# Patient Record
Sex: Male | Born: 1960 | Race: White | Hispanic: No | Smoking: Never smoker
Health system: Southern US, Community
[De-identification: ages and names within clinical notes are randomized; demographics above are authoritative.]

## PROBLEM LIST (undated history)

## (undated) ENCOUNTER — Emergency Department (HOSPITAL_COMMUNITY): Admission: EM | Payer: PPO | Source: Home / Self Care

## (undated) ENCOUNTER — Ambulatory Visit (HOSPITAL_COMMUNITY): Admission: EM

## (undated) DIAGNOSIS — R519 Headache, unspecified: Secondary | ICD-10-CM

## (undated) DIAGNOSIS — E7800 Pure hypercholesterolemia, unspecified: Secondary | ICD-10-CM

## (undated) DIAGNOSIS — I8290 Acute embolism and thrombosis of unspecified vein: Secondary | ICD-10-CM

## (undated) DIAGNOSIS — I69320 Aphasia following cerebral infarction: Secondary | ICD-10-CM

## (undated) DIAGNOSIS — T7840XA Allergy, unspecified, initial encounter: Secondary | ICD-10-CM

## (undated) DIAGNOSIS — R51 Headache: Secondary | ICD-10-CM

## (undated) DIAGNOSIS — I639 Cerebral infarction, unspecified: Secondary | ICD-10-CM

## (undated) HISTORY — PX: HERNIA REPAIR: SHX51

## (undated) HISTORY — DX: Acute embolism and thrombosis of unspecified vein: I82.90

## (undated) HISTORY — DX: Pure hypercholesterolemia, unspecified: E78.00

## (undated) HISTORY — DX: Cerebral infarction, unspecified: I63.9

## (undated) HISTORY — DX: Allergy, unspecified, initial encounter: T78.40XA

## (undated) HISTORY — DX: Aphasia following cerebral infarction: I69.320

## (undated) HISTORY — DX: Headache: R51

## (undated) HISTORY — DX: Headache, unspecified: R51.9

---

## 2015-01-16 LAB — CBC AND DIFFERENTIAL
HEMATOCRIT: 47 % (ref 41–53)
Hemoglobin: 16 g/dL (ref 13.5–17.5)
Platelets: 216 10*3/uL (ref 150–399)
WBC: 5.6 10^3/mL

## 2015-01-16 LAB — HEPATIC FUNCTION PANEL
ALT: 35 U/L (ref 10–40)
AST: 21 U/L (ref 14–40)

## 2015-01-16 LAB — BASIC METABOLIC PANEL
BUN: 13 mg/dL (ref 4–21)
CREATININE: 1 mg/dL (ref <=1.3)
GLUCOSE: 94 mg/dL
POTASSIUM: 5.2 mmol/L (ref 3.4–5.3)
Sodium: 143 mmol/L (ref 137–147)

## 2015-01-16 LAB — PSA: PSA: 1.2

## 2015-01-16 LAB — LIPID PANEL
Cholesterol: 146 mg/dL (ref 0–200)
HDL: 53 mg/dL (ref 35–70)
LDL CALC: 79 mg/dL
Triglycerides: 70 mg/dL (ref 40–160)

## 2015-01-16 LAB — TSH: TSH: 0.77 u[IU]/mL (ref <=5.90)

## 2015-03-28 ENCOUNTER — Ambulatory Visit: Payer: Self-pay | Admitting: Speech Pathology

## 2015-04-03 ENCOUNTER — Ambulatory Visit: Payer: Self-pay | Admitting: Speech Pathology

## 2015-04-05 ENCOUNTER — Ambulatory Visit: Payer: Self-pay | Admitting: Speech Pathology

## 2015-04-09 ENCOUNTER — Ambulatory Visit: Payer: Self-pay | Admitting: Speech Pathology

## 2015-04-12 ENCOUNTER — Ambulatory Visit: Payer: Self-pay | Admitting: Speech Pathology

## 2015-04-16 ENCOUNTER — Ambulatory Visit: Payer: Self-pay | Admitting: Speech Pathology

## 2015-04-19 ENCOUNTER — Ambulatory Visit: Payer: Self-pay | Admitting: Speech Pathology

## 2015-04-23 ENCOUNTER — Ambulatory Visit: Payer: Self-pay | Admitting: Speech Pathology

## 2015-04-26 ENCOUNTER — Ambulatory Visit: Payer: Self-pay | Admitting: Speech Pathology

## 2015-06-03 ENCOUNTER — Encounter: Payer: Self-pay | Admitting: Physician Assistant

## 2015-06-03 ENCOUNTER — Ambulatory Visit (INDEPENDENT_AMBULATORY_CARE_PROVIDER_SITE_OTHER): Payer: Managed Care, Other (non HMO) | Admitting: Physician Assistant

## 2015-06-03 VITALS — BP 120/78 | HR 73 | Temp 98.2°F | Resp 16 | Wt 182.2 lb

## 2015-06-03 DIAGNOSIS — R51 Headache: Secondary | ICD-10-CM

## 2015-06-03 DIAGNOSIS — I999 Unspecified disorder of circulatory system: Secondary | ICD-10-CM | POA: Insufficient documentation

## 2015-06-03 DIAGNOSIS — Z789 Other specified health status: Secondary | ICD-10-CM | POA: Insufficient documentation

## 2015-06-03 DIAGNOSIS — R42 Dizziness and giddiness: Secondary | ICD-10-CM | POA: Diagnosis not present

## 2015-06-03 DIAGNOSIS — R519 Headache, unspecified: Secondary | ICD-10-CM | POA: Insufficient documentation

## 2015-06-03 DIAGNOSIS — I639 Cerebral infarction, unspecified: Secondary | ICD-10-CM | POA: Insufficient documentation

## 2015-06-03 DIAGNOSIS — E7800 Pure hypercholesterolemia, unspecified: Secondary | ICD-10-CM | POA: Insufficient documentation

## 2015-06-03 DIAGNOSIS — I69920 Aphasia following unspecified cerebrovascular disease: Secondary | ICD-10-CM | POA: Insufficient documentation

## 2015-06-03 NOTE — Patient Instructions (Signed)

## 2015-06-03 NOTE — Progress Notes (Signed)
Patient ID: Johnathan Campbell, male   DOB: 10-Jan-1961, 54 y.o.   MRN: [id number]       Patient: Johnathan Campbell Male    DOB: 20-Mar-1961   54 y.o.   MRN: [id number] Visit Date: 06/03/2015  Today's Provider: Mar Daring, PA-C   Chief Complaint  Patient presents with  . Headache    for about three weeks,   . Dizziness    mornings,  for about around 2 moths   Subjective:    Headache  This is a recurrent problem. The current episode started more than 1 month ago. The problem occurs intermittently. The problem has been unchanged. The pain is located in the left unilateral region. The pain does not radiate. The pain quality is similar to prior headaches. The quality of the pain is described as dull and throbbing. The pain is at a severity of 3/10. The pain is mild. Associated symptoms include dizziness, photophobia and sinus pressure. Pertinent negatives include no abdominal pain, abnormal behavior, anorexia, back pain, blurred vision, coughing, drainage, ear pain, eye pain, eye redness, eye watering, facial sweating, fever, hearing loss, insomnia, loss of balance, muscle aches, nausea, neck pain, numbness, phonophobia, rhinorrhea, scalp tenderness, seizures, sore throat, swollen glands, tingling, tinnitus, visual change, vomiting, weakness or weight loss. The symptoms are aggravated by bright light (when he gets frustrated trying to speak). He has tried acetaminophen for the symptoms. The treatment provided significant relief. His past medical history is significant for migraines in the family. There is no history of cancer, cluster headaches, hypertension, immunosuppression, migraine headaches, obesity, pseudotumor cerebri, recent head traumas, sinus disease or TMJ. (Personal h/o embolic CVA 2 years ago)  Dizziness This is a recurrent problem. The current episode started more than 1 month ago (2 months ago). The problem occurs intermittently. The problem has been unchanged. Associated symptoms  include vertigo. Pertinent negatives include no abdominal pain, anorexia, arthralgias, change in bowel habit, chest pain, chills, congestion, coughing, diaphoresis, fatigue, fever, headaches, joint swelling, myalgias, nausea, neck pain, numbness, rash, sore throat, swollen glands, urinary symptoms, visual change, vomiting or weakness. Associated symptoms comments: Occurs in the mornings when he first wakes up. Nothing aggravates the symptoms. He has tried nothing for the symptoms.       Not on File Previous Medications   ASPIRIN 325 MG TABLET    Take 1 tablet by mouth daily.   ATORVASTATIN (LIPITOR) 80 MG TABLET    Take 1 tablet by mouth at bedtime.   LORATADINE (CLARITIN) 10 MG TABLET    Take 1 tablet by mouth.   VENLAFAXINE HCL 75 MG TB24    Take 1 tablet by mouth daily.    Review of Systems  Constitutional: Negative for fever, chills, weight loss, diaphoresis and fatigue.  HENT: Positive for sinus pressure. Negative for congestion, ear pain, hearing loss, rhinorrhea, sore throat and tinnitus.   Eyes: Positive for photophobia. Negative for blurred vision, pain and redness.  Respiratory: Negative for cough.   Cardiovascular: Negative for chest pain.  Gastrointestinal: Negative for nausea, vomiting, abdominal pain, anorexia and change in bowel habit.  Musculoskeletal: Negative for myalgias, back pain, joint swelling, arthralgias and neck pain.  Skin: Negative for rash.  Neurological: Positive for dizziness and vertigo. Negative for tingling, seizures, syncope, weakness, light-headedness, numbness, headaches and loss of balance.  Psychiatric/Behavioral: Negative.  The patient does not have insomnia.     History  Substance Use Topics  . Smoking status: Never Smoker   . Smokeless tobacco:  Not on file  . Alcohol Use: Not on file   Objective:   BP 120/78 mmHg  Pulse 73  Temp(Src) 98.2 F (36.8 C) (Oral)  Resp 16  Wt 182 lb 3.2 oz (82.645 kg)  Physical Exam  Constitutional: He is  oriented to person, place, and time. He appears well-developed and well-nourished. No distress.  HENT:  Head: Normocephalic and atraumatic.  Right Ear: Hearing, tympanic membrane, external ear and ear canal normal.  Left Ear: Hearing, tympanic membrane, external ear and ear canal normal.  Nose: Nose normal.  Mouth/Throat: Uvula is midline, oropharynx is clear and moist and mucous membranes are normal. No oropharyngeal exudate.  Eyes: Conjunctivae are normal. Pupils are equal, round, and reactive to light. Right eye exhibits no discharge. 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 No scleral icterus.  Cardiovascular: Normal rate, regular rhythm and normal heart sounds.  Exam reveals no gallop and no friction rub.   No murmur heard. Pulmonary/Chest: Effort normal and breath sounds normal. No respiratory distress. He has no wheezes. He has no rales.  Neurological: He is alert and oriented to person, place, and time. He has normal reflexes. No cranial nerve deficit. Coordination normal.  Skin: He is not diaphoretic.  Psychiatric: He has a normal mood and affect. His behavior is normal. Judgment and thought content normal.  Vitals reviewed.       Assessment & Plan:     1. Headache, unspecified headache type Will have him start a headache journal to see if there are any possible triggers.  Being that most headaches are controlled by tylenol will not obtain any imaging at this time.  If headaches worsen will check ESR and CRP and consider MRI.  I also question headache relationship with the lack of vision he now has in his left eye secondary to the stroke.  He does also run 6-10 miles daily and dehydration may be a possible source as well.    2. Dizziness Only occurs in morning after waking.  Subsides once he gets up.  DDx: dehydration, BPPV, or caused by visual change from CVA and eyes adjusting when he first opens them from sleep.        Mar Daring, PA-C  [facility]

## 2015-07-15 ENCOUNTER — Telehealth: Payer: Self-pay | Admitting: Family Medicine

## 2015-07-15 ENCOUNTER — Ambulatory Visit (INDEPENDENT_AMBULATORY_CARE_PROVIDER_SITE_OTHER): Payer: Managed Care, Other (non HMO) | Admitting: Family Medicine

## 2015-07-15 ENCOUNTER — Encounter: Payer: Self-pay | Admitting: Family Medicine

## 2015-07-15 VITALS — BP 118/76 | HR 78 | Temp 98.3°F | Resp 14 | Wt 180.0 lb

## 2015-07-15 DIAGNOSIS — Z23 Encounter for immunization: Secondary | ICD-10-CM | POA: Diagnosis not present

## 2015-07-15 DIAGNOSIS — E7800 Pure hypercholesterolemia, unspecified: Secondary | ICD-10-CM

## 2015-07-15 DIAGNOSIS — I69920 Aphasia following unspecified cerebrovascular disease: Secondary | ICD-10-CM | POA: Diagnosis not present

## 2015-07-15 NOTE — Progress Notes (Signed)
Patient ID: Johnathan Campbell, male   DOB: [birth year], 54 y.o.   MRN: [id number]    Subjective:  HPI   Lipid/Cholesterol, Follow-up:   Last seen for this6 months ago.  Management changes since that visit include none. . Last Lipid Panel:    Component Value Date/Time   CHOL 146 01/16/2015   TRIG 70 01/16/2015   HDL 53 01/16/2015   [place] 79 01/16/2015    He reports good compliance with treatment. He is not having side effects.  Current symptoms include none and have been stable. Weight trend: stable Prior visit with dietician: no Current diet: in general, a "healthy" diet   Current exercise: walking  Wt Readings from Last 3 Encounters:  07/15/15 180 lb (81.647 kg)  06/03/15 182 lb 3.2 oz (82.645 kg)  01/16/15 182 lb (82.555 kg)    -------------------------------------------------------------------   Depression, Follow-up  He  was last seen for this 6 months ago. Changes made at last visit include none.   He reports good compliance with treatment. He is not having side effects.   He reports good tolerance of treatment. Current symptoms include: none, patient states he is feeling fine as far as he could tell.  He feels he is Unchanged since last visit.   Dizziness follow up: Patient saw Fenton Malling, PA on August 1 st for headache and dizziness. Patient states symptoms have improved. He does have to stop at times when he is running/walking for exercise. Patient does have aphasia.     Prior to Admission medications   Medication Sig Start Date End Date Taking? Authorizing Provider  aspirin 325 MG tablet Take 1 tablet by mouth daily.   Yes Historical Provider, MD  atorvastatin (LIPITOR) 80 MG tablet Take 1 tablet by mouth at bedtime. 01/16/15  Yes Historical Provider, MD  loratadine (CLARITIN) 10 MG tablet Take 1 tablet by mouth.   Yes Historical Provider, MD  Venlafaxine HCl 75 MG TB24 Take 1 tablet by mouth daily. 01/16/15  Yes Historical Provider, MD     Patient Active Problem List   Diagnosis Date Noted  . Cephalalgia 06/03/2015  . Problems influencing health status 06/03/2015  . 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 Angiopathy 06/03/2015  . Completed stroke 06/03/2015  . Hypercholesterolemia 06/03/2015    Past Medical History  Diagnosis Date  . Clot     blood clot  . Allergy   . Hypercholesterolemia   . Completed stroke   . Aphasia S/P CVA   . Acute intractable headache     unspecified headache type    Social History   Social History  . Marital Status: Legally Separated    Spouse Name: N/A  . Number of Children: N/A  . Years of Education: N/A   Occupational History  . Not on file.   Social History Main Topics  . Smoking status: Never Smoker   . Smokeless tobacco: Never Used  . Alcohol Use: No  . Drug Use: No  . Sexual Activity: No   Other Topics Concern  . Not on file   Social History Narrative    Not on File  Review of Systems  Constitutional: Negative.   Respiratory: Negative.   Cardiovascular: Negative.   Gastrointestinal: Negative.   Musculoskeletal: Negative.   Neurological: Positive for speech change.  Psychiatric/Behavioral: The patient is nervous/anxious.      There is no immunization history on file for this patient. Objective:  BP 118/76 mmHg  Pulse 78  Temp(Src) 98.3 F (36.8 C)  Resp 14  Wt 180 lb (81.647 kg)  Physical Exam  Constitutional: He is oriented to person, place, and time and well-developed, well-nourished, and in no distress.  HENT:  Head: Normocephalic and atraumatic.  Right Ear: External ear normal.  Left Ear: External ear normal.  Nose: Nose normal.  Eyes: Conjunctivae are normal. Pupils are equal, round, and reactive to light.  Cardiovascular: Normal rate, regular rhythm and normal heart sounds.   Pulmonary/Chest: Effort normal and breath sounds normal.  Abdominal: Soft.  Neurological: He is alert and oriented to person,  place, and time. Gait normal.  Skin: Skin is warm and dry.  Psychiatric: Mood and affect normal.    Lab Results  Component Value Date   WBC 5.6 01/16/2015   HGB 16.0 01/16/2015   HCT 47 01/16/2015   PLT 216 01/16/2015   CHOL 146 01/16/2015   TRIG 70 01/16/2015   HDL 53 01/16/2015   LDLCALC 79 01/16/2015   TSH 0.77 01/16/2015   PSA 1.2 01/16/2015    CMP     Component Value Date/Time   NA 143 01/16/2015   K 5.2 01/16/2015   BUN 13 01/16/2015   CREATININE 1.0 01/16/2015   AST 21 01/16/2015   ALT 35 01/16/2015    Assessment and Plan :  1. 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 This is the major concern today. I am concerned that his wife is actually getting his disability check. He has no income source on his own. Have 3 children but I think they are all out of the house. Would refer to [facility] for evaluation for his driving.  2. Hypercholesterolemia   3. Need for influenza vaccination  - Flu Vaccine QUAD 36+ mos IM (Fluarix & Fluzone Quad PF 4. depression and anxiety.   Miguel Aschoff MD [facility] 07/15/2015 9:23 AM

## 2015-08-01 NOTE — Telephone Encounter (Signed)
Information faxed to [facility] for driving evaluation (Phone [phone]).Their office will contact pt

## 2015-10-29 ENCOUNTER — Ambulatory Visit: Payer: Managed Care, Other (non HMO) | Admitting: Family Medicine

## 2015-11-18 ENCOUNTER — Ambulatory Visit (INDEPENDENT_AMBULATORY_CARE_PROVIDER_SITE_OTHER): Payer: PPO | Admitting: Family Medicine

## 2015-11-18 ENCOUNTER — Encounter: Payer: Self-pay | Admitting: Family Medicine

## 2015-11-18 VITALS — BP 128/76 | HR 82 | Temp 98.0°F | Resp 16 | Wt 187.0 lb

## 2015-11-18 DIAGNOSIS — F32A Depression, unspecified: Secondary | ICD-10-CM

## 2015-11-18 DIAGNOSIS — F329 Major depressive disorder, single episode, unspecified: Secondary | ICD-10-CM | POA: Diagnosis not present

## 2015-11-18 DIAGNOSIS — J3089 Other allergic rhinitis: Secondary | ICD-10-CM | POA: Diagnosis not present

## 2015-11-18 DIAGNOSIS — E7800 Pure hypercholesterolemia, unspecified: Secondary | ICD-10-CM

## 2015-11-18 DIAGNOSIS — I69920 Aphasia following unspecified cerebrovascular disease: Secondary | ICD-10-CM

## 2015-11-18 MED ORDER — MONTELUKAST SODIUM 10 MG PO TABS: 10.0000 mg | ORAL_TABLET | Freq: Every day | ORAL | Status: DC

## 2015-11-18 MED ORDER — VENLAFAXINE HCL ER 75 MG PO TB24: 1.0000 | ORAL_TABLET | Freq: Two times a day (BID) | ORAL | Status: DC

## 2015-11-18 NOTE — Progress Notes (Signed)
Patient ID: Johnathan Campbell, male   DOB: 1961/10/27, 55 y.o.   MRN: [id number]    Subjective:  HPI  Patient is here for 5 months follow up.  No changes were made on last visit.  Aphasia: Did discuss on the last visit that patient should not be driving and that he could try to go to [facility] for driving evaluation. Looks like insurance will not cover this service per patient;s mother. Patient is staying pretty active physically per his mom.   Depression: Mother states that maybe something needs to be adjusted in the medication for patient's depression. She noticed he has been not as talkative, withdrawn, the past 2 days sleeping more than usual. He is taking Venlafaxine 75 mg 1 tablet daily. Depression screen [facility] 2/9 11/18/2015 07/15/2015  Decreased Interest 1 0  Down, Depressed, Hopeless 2 0  PHQ - 2 Score 3 0  Altered sleeping 1 -  Tired, decreased energy 0 -  Change in appetite 0 -  Feeling bad or failure about yourself  3 -  Trouble concentrating 1 -  Moving slowly or fidgety/restless 0 -  Suicidal thoughts 1 -  PHQ-9 Score 9 -  Difficult doing work/chores Somewhat difficult -     Also patient has been clearing his throat a lot and at times its almost like he is gagging. Mother states it does not seem to be related to the food always that she can notice. No chest pain.  Prior to Admission medications   Medication Sig Start Date End Date Taking? Authorizing Provider  aspirin 325 MG tablet Take 1 tablet by mouth daily.   Yes Historical Provider, MD  atorvastatin (LIPITOR) 80 MG tablet Take 1 tablet by mouth at bedtime. 01/16/15  Yes Historical Provider, MD  loratadine (CLARITIN) 10 MG tablet Take 1 tablet by mouth.   Yes Historical Provider, MD  Venlafaxine HCl 75 MG TB24 Take 1 tablet by mouth daily. 01/16/15  Yes Historical Provider, MD    Patient Active Problem List   Diagnosis Date Noted  . Cephalalgia 06/03/2015  . Problems influencing health status 06/03/2015  . Aphasia due  to late effects of cerebrovascular disease 06/03/2015  . Angiopathy 06/03/2015  . Completed stroke ([place]) 06/03/2015  . Hypercholesterolemia 06/03/2015    Past Medical History  Diagnosis Date  . Clot     blood clot  . Allergy   . Hypercholesterolemia   . Completed stroke ([place])   . Aphasia S/P CVA   . Acute intractable headache     unspecified headache type    Social History   Social History  . Marital Status: Legally Separated    Spouse Name: N/A  . Number of Children: N/A  . Years of Education: N/A   Occupational History  . Not on file.   Social History Main Topics  . Smoking status: Never Smoker   . Smokeless tobacco: Never Used  . Alcohol Use: No  . Drug Use: No  . Sexual Activity: No   Other Topics Concern  . Not on file   Social History Narrative    No Known Allergies  Review of Systems  Constitutional: Negative.   Respiratory: Negative.   Cardiovascular: Negative.   Gastrointestinal: Negative.   Genitourinary: Negative.   Musculoskeletal: Positive for joint pain. Negative for myalgias, falls and neck pain.  Endo/Heme/Allergies: Negative.   Psychiatric/Behavioral: Positive for depression.    Immunization History  Administered Date(s) Administered  . Influenza,inj,Quad PF,36+ Mos 07/15/2015  . Td 03/31/2005  Objective:  BP 128/76 mmHg  Pulse 82  Temp(Src) 98 F (36.7 C)  Resp 16  Wt 187 lb (84.823 kg)  Physical Exam  Constitutional: He is oriented to person, place, and time and well-developed, well-nourished, and in no distress.  HENT:  Head: Normocephalic and atraumatic.  Eyes: Conjunctivae are normal. Pupils are equal, round, and reactive to light.  Neck: Normal range of motion. Neck supple.  Cardiovascular: Normal rate, regular rhythm, normal heart sounds and intact distal pulses.   No murmur heard. Pulmonary/Chest: Effort normal and breath sounds normal. No respiratory distress. He has no wheezes.  Abdominal: Soft.    Musculoskeletal: He exhibits no edema or tenderness.  Neurological: He is alert and oriented to person, place, and time.  Skin: Skin is warm and dry.  Psychiatric: Mood and affect normal.    Lab Results  Component Value Date   WBC 5.6 01/16/2015   HGB 16.0 01/16/2015   HCT 47 01/16/2015   PLT 216 01/16/2015   CHOL 146 01/16/2015   TRIG 70 01/16/2015   HDL 53 01/16/2015   LDLCALC 79 01/16/2015   TSH 0.77 01/16/2015   PSA 1.2 01/16/2015    CMP     Component Value Date/Time   NA 143 01/16/2015   K 5.2 01/16/2015   BUN 13 01/16/2015   CREATININE 1.0 01/16/2015   AST 21 01/16/2015   ALT 35 01/16/2015    Assessment and Plan :  1. 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 Advised patient and his mother that if patient wants to get evaluated for driving he needs to follow through neurologist. Will go ahead and refer to neurologist for follow up also. - CBC with Differential/Platelet I do not think it is safe for the patient does drive at this time. 2. Hypercholesterolemia Check labs - Comprehensive metabolic panel - Lipid Panel With LDL/HDL Ratio  3. Depression Worsening. Will increase Venlafaxine to 2 tablets daily. - CBC with Differential/Platelet - TSH  4. Other allergic rhinitis Will add Singulair. Follow as needed.  I have done the exam and reviewed the above chart and it is accurate to the best of my knowledge.  Miguel Aschoff MD [facility] 11/18/2015 10:17 AM

## 2015-11-19 ENCOUNTER — Telehealth: Payer: Self-pay | Admitting: Family Medicine

## 2015-11-20 ENCOUNTER — Telehealth: Payer: Self-pay | Admitting: Family Medicine

## 2015-11-20 NOTE — Telephone Encounter (Signed)
Left message for patient to call back, i do not see this pharmacy on file. i want to make sure he is actually using this pharmacy before sending this in.-aa

## 2015-11-20 NOTE — Telephone Encounter (Signed)
I spoke with mom, they have never heard of this pharmacy and do not need any medication right now. I will look into this more to see if this legit.

## 2015-11-20 NOTE — Telephone Encounter (Signed)
Alex from [facility] called stating that they have prescription on file for Venlafaxine HCl 75 MG. once daily.It is listed in pt's chart to take 2 x daily.They need a new prescription on file.Phone [phone].Fax [phone]

## 2015-11-22 DIAGNOSIS — E7800 Pure hypercholesterolemia, unspecified: Secondary | ICD-10-CM | POA: Diagnosis not present

## 2015-11-22 DIAGNOSIS — F329 Major depressive disorder, single episode, unspecified: Secondary | ICD-10-CM | POA: Diagnosis not present

## 2015-11-22 DIAGNOSIS — I69920 Aphasia following unspecified cerebrovascular disease: Secondary | ICD-10-CM | POA: Diagnosis not present

## 2015-11-23 LAB — COMPREHENSIVE METABOLIC PANEL
A/G RATIO: 1.8 (ref 1.1–2.5)
ALT: 29 IU/L (ref 0–44)
AST: 21 IU/L (ref 0–40)
Albumin: 3.9 g/dL (ref 3.5–5.5)
Alkaline Phosphatase: 72 IU/L (ref 39–117)
BILIRUBIN TOTAL: 0.8 mg/dL (ref 0.0–1.2)
BUN/Creatinine Ratio: 18 (ref 9–20)
BUN: 17 mg/dL (ref 6–24)
CHLORIDE: 104 mmol/L (ref 96–106)
CO2: 25 mmol/L (ref 18–29)
Calcium: 9.2 mg/dL (ref 8.7–10.2)
Creatinine, Ser: 0.95 mg/dL (ref 0.76–1.27)
GFR calc Af Amer: 104 mL/min/{1.73_m2} (ref >59)
GFR calc non Af Amer: 90 mL/min/{1.73_m2} (ref >59)
GLUCOSE: 88 mg/dL (ref 65–99)
Globulin, Total: 2.2 g/dL (ref 1.5–4.5)
POTASSIUM: 4.9 mmol/L (ref 3.5–5.2)
Sodium: 143 mmol/L (ref 134–144)
Total Protein: 6.1 g/dL (ref 6.0–8.5)

## 2015-11-23 LAB — CBC WITH DIFFERENTIAL/PLATELET
BASOS ABS: 0 10*3/uL (ref 0.0–0.2)
BASOS: 0 %
EOS (ABSOLUTE): 0.2 10*3/uL (ref 0.0–0.4)
Eos: 3 %
Hematocrit: 41.8 % (ref 37.5–51.0)
Hemoglobin: 14.1 g/dL (ref 12.6–17.7)
IMMATURE GRANULOCYTES: 0 %
Immature Grans (Abs): 0 10*3/uL (ref 0.0–0.1)
Lymphocytes Absolute: 1.5 10*3/uL (ref 0.7–3.1)
Lymphs: 27 %
MCH: 30.3 pg (ref 26.6–33.0)
MCHC: 33.7 g/dL (ref 31.5–35.7)
MCV: 90 fL (ref 79–97)
MONOS ABS: 0.5 10*3/uL (ref 0.1–0.9)
Monocytes: 9 %
NEUTROS PCT: 61 %
Neutrophils Absolute: 3.4 10*3/uL (ref 1.4–7.0)
PLATELETS: 198 10*3/uL (ref 150–379)
RBC: 4.66 x10E6/uL (ref 4.14–5.80)
RDW: 13.1 % (ref 12.3–15.4)
WBC: 5.5 10*3/uL (ref 3.4–10.8)

## 2015-11-23 LAB — TSH: TSH: 0.829 u[IU]/mL (ref 0.450–4.500)

## 2015-11-23 LAB — LIPID PANEL WITH LDL/HDL RATIO
Cholesterol, Total: 128 mg/dL (ref 100–199)
HDL: 59 mg/dL (ref >39)
LDL Calculated: 60 mg/dL (ref 0–99)
LDl/HDL Ratio: 1 ratio units (ref 0.0–3.6)
TRIGLYCERIDES: 47 mg/dL (ref 0–149)
VLDL CHOLESTEROL CAL: 9 mg/dL (ref 5–40)

## 2015-12-19 ENCOUNTER — Other Ambulatory Visit: Payer: Self-pay | Admitting: Family Medicine

## 2015-12-19 MED ORDER — MONTELUKAST SODIUM 10 MG PO TABS: 10.0000 mg | ORAL_TABLET | Freq: Every day | ORAL | Status: DC

## 2015-12-19 MED ORDER — ATORVASTATIN CALCIUM 80 MG PO TABS: 80.0000 mg | ORAL_TABLET | Freq: Every day | ORAL | Status: DC

## 2015-12-19 MED ORDER — VENLAFAXINE HCL ER 75 MG PO CP24
75.0000 mg | ORAL_CAPSULE | Freq: Every day | ORAL | Status: DC
Start: 2015-12-19 — End: 2015-12-20

## 2015-12-19 NOTE — Telephone Encounter (Signed)
Okay to refill all 1 year.

## 2015-12-19 NOTE — Telephone Encounter (Signed)
Mother, Mrs. Schlueter, advised, RXs sent -aa

## 2015-12-19 NOTE — Telephone Encounter (Signed)
Pt needs refills on his   atorvastatin (LIPITOR) 80 MG tablet montelukast (SINGULAIR) 10 MG tablet Venlafaxine HCl 75 MG TB24....  He needs these in capsul form.  It's less expensive.  He nees 90 day refills.  He has changed insurance   [facility] [address]    His moms call back is [phone]  Thank sTeri

## 2015-12-20 ENCOUNTER — Other Ambulatory Visit: Payer: Self-pay | Admitting: Emergency Medicine

## 2015-12-20 ENCOUNTER — Telehealth: Payer: Self-pay | Admitting: Family Medicine

## 2015-12-20 MED ORDER — VENLAFAXINE HCL 75 MG PO TABS: 75.0000 mg | ORAL_TABLET | Freq: Two times a day (BID) | ORAL | Status: DC

## 2015-12-20 NOTE — Telephone Encounter (Signed)
Spoke with mother. Venlafaxine was suppose to be BID not qd. New rx sent to pharmacy.

## 2015-12-20 NOTE — Telephone Encounter (Signed)
Pt's mother needs clarifications on his medications.  Her call back is [phone]  Thanks Con Memos

## 2015-12-27 DIAGNOSIS — R4701 Aphasia: Secondary | ICD-10-CM | POA: Diagnosis not present

## 2015-12-27 DIAGNOSIS — I639 Cerebral infarction, unspecified: Secondary | ICD-10-CM | POA: Diagnosis not present

## 2016-01-13 ENCOUNTER — Ambulatory Visit: Payer: PPO | Attending: Neurology | Admitting: Speech Pathology

## 2016-01-13 DIAGNOSIS — R4701 Aphasia: Secondary | ICD-10-CM | POA: Insufficient documentation

## 2016-01-14 ENCOUNTER — Encounter: Payer: Self-pay | Admitting: Speech Pathology

## 2016-01-14 NOTE — Therapy (Signed)
[place] MAIN [facility] SERVICES [address] [place], [place], [address] Phone: [phone]   Fax:  [phone]  Speech Language Pathology Evaluation  Patient Details  Name: Johnathan Campbell MRN: [id number] Date of Birth: [birth year] Referring Provider: Dr. Manuella Ghazi  Encounter Date: 01/13/2016      End of Session - 01/14/16 1042    Visit Number 1   Number of Visits 9   Date for SLP Re-Evaluation 02/14/16   SLP Start Time 0900   SLP Stop Time  1000   SLP Time Calculation (min) 60 min   Activity Tolerance Patient tolerated treatment well      Past Medical History  Diagnosis Date  . Clot     blood clot  . Allergy   . Hypercholesterolemia   . Completed stroke ([place])   . Aphasia S/P CVA   . Acute intractable headache     unspecified headache type    Past Surgical History  Procedure Laterality Date  . Hernia repair  twice    There were no vitals filed for this visit.  Visit Diagnosis: Aphasia - Plan: SLP plan of care cert/re-cert      Subjective Assessment - 01/14/16 1041    Subjective 55 year old man S/P CVA 05/15/2013 with resultant aphasia.  The patient states that he has not had speech therapy since therapy immediately following the stroke.  The patient has recently moved to this area.   Currently in Pain? No/denies            SLP Evaluation [facility] - 01/14/16 0001    SLP Visit Information   SLP Received On 01/13/16   Referring Provider Dr. Manuella Ghazi   Onset Date 05/15/13   Medical Diagnosis CVA   Subjective   Subjective 55 year old man S/P CVA 05/15/2013 with resultant aphasia.  The patient states that he has not had speech therapy since therapy immediately following the stroke.  The patient has recently moved to this area.   Patient/Family Stated Goal Patient would like to maximize his communication skills and have a home exercise program for ongoing practice after therapy.   Prior Functional Status   Cognitive/Linguistic Baseline  Within functional limits   Cognition   Overall Cognitive Status Impaired/Different from baseline   Oral Motor/Sensory Function   Overall Oral Motor/Sensory Function Appears within functional limits for tasks assessed   Motor Speech   Overall Motor Speech Appears within functional limits for tasks assessed   Standardized Assessments   Standardized Assessments  Western Aphasia Battery revised        Western Aphasia Battery- Revised   Spontaneous Speech      Information content  7/10       Fluency   5/10      Comprehension     Yes/No questions  51/60        Auditory Word Recognition 56/60        Sequential Commands 27/80     Repetition   44/100      Naming    Object Naming  46/60        Word Fluency   12/20        Sentence Completion 4/10        Responsive Speech 1/10      Aphasia Quotient  58.8/100    Reading and Writing    Reading   82/100        Writing   88.5/100    Language Quotient  69.6/100  SLP Education - 02/12/16 1041    Education provided Yes   Education Details Value of speech therapy at this point in his recovery   Person(s) Educated Patient   Methods Explanation   Comprehension Verbalized understanding            SLP Long Term Goals - February 12, 2016 1050    SLP LONG TERM GOAL #1   Title Patient will generate grammatical and cogent sentence to complete simple/concrete linguistic task with 80% accuracy.   Time 4   Period Weeks   Status New   SLP LONG TERM GOAL #2   Title Patient will read aloud sentences and multi-syllabic words, maintaining phonemic accuracy, with 80% accuracy.     Time 4   Period Weeks   Status New   SLP LONG TERM GOAL #3   Title Patient will write grammatical and cogent sentence to complete abstract/complex linguistic task with 80% accuracy.   Time 4   Period Weeks   Status New   SLP LONG TERM GOAL #4   Title Patient will participate in the development of a home exercise program.   Time 4   Period Weeks    Status New          Plan - 02-12-16 1043    Clinical Impression Statement At 2 and  years post onset of CVA, the patient is presenting with moderate-severe aphasia characterized by impaired fluency and information content of spontaneous speech, reduced comprehension for oral sequential commands, impaired repetition, and impaired naming/word finding.  The patient is testing with relative strengths in reading and writing.  The patient states that he has not had speech therapy since immediately following the stroke.  The patient has recently moved to this area.  Patient would like to maximize his communication skills and have a home exercise program for ongoing practice after therapy.  The patient will benefit from skilled speech therapy for restorative and compensatory treatment of auditory comprehension and verbal expression, exploit written language skills to maximize communicative competence, and development of home exercise program to maintain expected gains.   Speech Therapy Frequency 2x / week   Duration 4 weeks   Treatment/Interventions Language facilitation;Compensatory strategies;Patient/family education;SLP instruction and feedback   Potential to Achieve Goals Good   Potential Considerations Ability to learn/carryover information;Cooperation/participation level;Previous level of function;Severity of impairments;Family/community support   SLP Home Exercise Plan To be developed   Consulted and Agree with Plan of Care Patient          G-Codes - 02/12/16 1053    Functional Assessment Tool Used Western Aphasia Battery, clinical judgment   Functional Limitations Spoken language comprehension   Spoken Language Comprehension Current Status [phone]) At least 60 percent but less than 80 percent impaired, limited or restricted   Spoken Language Comprehension Goal Status (G9160) At least 40 percent but less than 60 percent impaired, limited or restricted      Problem List Patient Active  Problem List   Diagnosis Date Noted  . Cephalalgia 06/03/2015  . Problems influencing health status 06/03/2015  . 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 Angiopathy 06/03/2015  . Completed stroke ([place]) 06/03/2015  . Hypercholesterolemia 06/03/2015   [name], MS/CCC- SLP  Lou Miner 2016/02/12, 10:57 AM  [place] MAIN [facility] SERVICES [address] [place], [place], [address] Phone: [phone]   Fax:  [phone]  Name: Johnathan Campbell MRN: [id number] Date of Birth: 1960-11-19

## 2016-01-15 ENCOUNTER — Other Ambulatory Visit: Payer: Self-pay | Admitting: Family Medicine

## 2016-01-16 ENCOUNTER — Encounter: Payer: Self-pay | Admitting: Family Medicine

## 2016-01-16 ENCOUNTER — Ambulatory Visit (INDEPENDENT_AMBULATORY_CARE_PROVIDER_SITE_OTHER): Payer: PPO | Admitting: Family Medicine

## 2016-01-16 VITALS — BP 122/80 | HR 78 | Temp 97.8°F | Resp 12 | Wt 181.0 lb

## 2016-01-16 DIAGNOSIS — F32A Depression, unspecified: Secondary | ICD-10-CM

## 2016-01-16 DIAGNOSIS — I69920 Aphasia following unspecified cerebrovascular disease: Secondary | ICD-10-CM | POA: Diagnosis not present

## 2016-01-16 DIAGNOSIS — M659 Synovitis and tenosynovitis, unspecified: Secondary | ICD-10-CM

## 2016-01-16 DIAGNOSIS — L821 Other seborrheic keratosis: Secondary | ICD-10-CM

## 2016-01-16 DIAGNOSIS — F329 Major depressive disorder, single episode, unspecified: Secondary | ICD-10-CM

## 2016-01-16 DIAGNOSIS — M778 Other enthesopathies, not elsewhere classified: Secondary | ICD-10-CM

## 2016-01-16 MED ORDER — ATORVASTATIN CALCIUM 80 MG PO TABS: 80.0000 mg | ORAL_TABLET | Freq: Every day | ORAL | Status: DC

## 2016-01-16 MED ORDER — MONTELUKAST SODIUM 10 MG PO TABS: 10.0000 mg | ORAL_TABLET | Freq: Every day | ORAL | Status: DC

## 2016-01-16 MED ORDER — VENLAFAXINE HCL 75 MG PO TABS: 75.0000 mg | ORAL_TABLET | Freq: Two times a day (BID) | ORAL | Status: DC

## 2016-01-16 MED ORDER — NAPROXEN 500 MG PO TABS: 500.0000 mg | ORAL_TABLET | Freq: Two times a day (BID) | ORAL | Status: DC

## 2016-01-16 NOTE — Progress Notes (Signed)
Patient ID: Johnathan Campbell, male   DOB: 1961/01/29, 55 y.o.   MRN: [id number]    Subjective:  HPI Pt is here for a 2 month follow up of depression. Venlafaxine was increased to 75 mg BID. He and mother report that he seems to be doing better on this dose and doing well on it.   Elbow pain- Has been going on since he started coming to our office but seems to be bothering him more. It is his left elbow and he reports that it is just sore and achy. Certain movements reproduce the pain.   Skins lesions- He has had lesions or what he calls "warts" on the left side of her neck near his clavicle bone. He reports that have been there for a while but they seem to have gotten larger, he just wants them checked out.     He has been taking speech therapy. He is scheduled to having a driving test. Dr. Manuella Ghazi did not recommend him drive but he could try. They also have someone coming to the home to drive with him in their car. They are both scheduled for the same time and the mother would like to know which one would be more important. I think she more or less wants it to be told to the patient, as he is addiment to drive again.   Prior to Admission medications   Medication Sig Start Date End Date Taking? Authorizing Provider  aspirin 325 MG tablet Take 1 tablet by mouth daily.   Yes Historical Provider, MD  atorvastatin (LIPITOR) 80 MG tablet TAKE 1 TABLET BY MOUTH EVERY DAY AT BEDTIME 01/15/16  Yes Richard Maceo Pro., MD  loratadine (CLARITIN) 10 MG tablet Take 1 tablet by mouth.   Yes Historical Provider, MD  montelukast (SINGULAIR) 10 MG tablet Take 1 tablet (10 mg total) by mouth at bedtime. 12/19/15  Yes Richard Maceo Pro., MD  venlafaxine (EFFEXOR) 75 MG tablet Take 1 tablet (75 mg total) by mouth 2 (two) times daily. 12/20/15  Yes Richard Maceo Pro., MD    Patient Active Problem List   Diagnosis Date Noted  . Cephalalgia 06/03/2015  . Problems influencing health status 06/03/2015  . 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 Angiopathy 06/03/2015  . Completed stroke ([place]) 06/03/2015  . Hypercholesterolemia 06/03/2015    Past Medical History  Diagnosis Date  . Clot     blood clot  . Allergy   . Hypercholesterolemia   . Completed stroke ([place])   . Aphasia S/P CVA   . Acute intractable headache     unspecified headache type    Social History   Social History  . Marital Status: Legally Separated    Spouse Name: N/A  . Number of Children: N/A  . Years of Education: N/A   Occupational History  . Not on file.   Social History Main Topics  . Smoking status: Never Smoker   . Smokeless tobacco: Never Used  . Alcohol Use: No  . Drug Use: No  . Sexual Activity: No   Other Topics Concern  . Not on file   Social History Narrative    No Known Allergies  Review of Systems  Constitutional: Negative.   HENT: Negative.   Eyes: Negative.   Respiratory: Negative.   Cardiovascular: Negative.   Gastrointestinal: Negative.   Genitourinary: Negative.   Musculoskeletal: Positive for joint pain.       Tender over the  left ECRB insertion.  Skin: Negative.        Skin lesions  Neurological: Negative.   Endo/Heme/Allergies: Negative.   Psychiatric/Behavioral: Negative.     Immunization History  Administered Date(s) Administered  . Influenza,inj,Quad PF,36+ Mos 07/15/2015  . Td 03/31/2005   Objective:  BP 122/80 mmHg  Pulse 78  Temp(Src) 97.8 F (36.6 C) (Oral)  Resp 12  Wt 181 lb (82.101 kg)  Physical Exam  Constitutional: He is oriented to person, place, and time and well-developed, well-nourished, and in no distress.  Eyes: Conjunctivae and EOM are normal. Pupils are equal, round, and reactive to light.  Neck: Normal range of motion. Neck supple.  Cardiovascular: Normal rate, regular rhythm, normal heart sounds and intact distal pulses.   Pulmonary/Chest: Effort normal and breath sounds normal.  Musculoskeletal: He exhibits  tenderness (over left elbow).  Neurological: He is alert and oriented to person, place, and time. He has normal reflexes. Gait normal. GCS score is 15.  Skin: Skin is warm and dry.  SK on left clavicle.   Psychiatric: Mood, memory, affect and judgment normal.    Lab Results  Component Value Date   WBC 5.5 11/22/2015   HGB 16.0 01/16/2015   HCT 41.8 11/22/2015   PLT 198 11/22/2015   GLUCOSE 88 11/22/2015   CHOL 128 11/22/2015   TRIG 47 11/22/2015   HDL 59 11/22/2015   LDLCALC 60 11/22/2015   TSH 0.829 11/22/2015   PSA 1.2 01/16/2015    CMP     Component Value Date/Time   NA 143 11/22/2015 0816   K 4.9 11/22/2015 0816   CL 104 11/22/2015 0816   CO2 25 11/22/2015 0816   GLUCOSE 88 11/22/2015 0816   BUN 17 11/22/2015 0816   CREATININE 0.95 11/22/2015 0816   CREATININE 1.0 01/16/2015   CALCIUM 9.2 11/22/2015 0816   PROT 6.1 11/22/2015 0816   ALBUMIN 3.9 11/22/2015 0816   AST 21 11/22/2015 0816   ALT 29 11/22/2015 0816   ALKPHOS 72 11/22/2015 0816   BILITOT 0.8 11/22/2015 0816   GFRNONAA 90 11/22/2015 0816   GFRAA 104 11/22/2015 0816    Assessment and Plan :  1. 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  Definitely think patient needs driving evaluation is do not think he is safe to drive with the deficits that he has at this point in time. Also think his cognition is been affected somewhat by the stroke. Deferred to Dr. Manuella Ghazi and to [facility] driving evaluation. 2. Tendonitis of elbow, left Consider injection if this does not help. - naproxen (NAPROSYN) 500 MG tablet; Take 1 tablet (500 mg total) by mouth 2 (two) times daily with a meal.  Dispense: 60 tablet; Refill: 0  3. SK (seborrheic keratosis) Cryotherapy preformed.irritated SK is frozen today. This is on the left side anterior neck at the collar line.  4. Depression Improved.  Patient denies any suicidal ideation. Mother says that he is some better.  Patient was seen and  examined by Dr. Miguel Aschoff, and noted scribed by Webb Laws, [place] MD [facility] 01/16/2016 8:14 AM

## 2016-01-20 ENCOUNTER — Ambulatory Visit: Payer: PPO | Admitting: Speech Pathology

## 2016-01-21 ENCOUNTER — Telehealth: Payer: Self-pay | Admitting: Family Medicine

## 2016-01-21 NOTE — Telephone Encounter (Signed)
It is generally not a letter. Generally it is paperwork and forms for disability.if he has not been declared disabled and will be through neurology, not through me.

## 2016-01-21 NOTE — Telephone Encounter (Signed)
Pt.'s mother is calling asking if Dr. Rosanna Randy would write letter for pt.'s disability.  CB# [phone] /  CC

## 2016-01-21 NOTE — Telephone Encounter (Signed)
Please review. Thanks!  

## 2016-01-22 ENCOUNTER — Ambulatory Visit: Payer: PPO | Admitting: Speech Pathology

## 2016-01-22 NOTE — Telephone Encounter (Signed)
[id number] 

## 2016-01-22 NOTE — Telephone Encounter (Signed)
Please call the patient's mother and see if he is able to do his finances or does a letter need to repeat that his mother/brother/power of attorney will do his finances for him?are they comfortable having it worded that he can do his own finances.

## 2016-01-22 NOTE — Telephone Encounter (Signed)
Spoke with patient's mother, Flavil Mcmorran, she states that patient will be the only payee for the check and then when he does his bills and everything his mother Faustin Olinski and/or brother Abubakar Rensel will verify and help making sure he is doing everything accurate with numbers and all. So i guess letter would need to say that patient is able to handle his finances with verification of it been correct with help of mother and/or brother-he would not need to do it 100% by himself. Something in that nature. Thanks-aa

## 2016-01-22 NOTE — Telephone Encounter (Signed)
Pt needs a letter stating that he mental able to do his finances because his disability check is being written in his ex-wife's name and needs to be in his name now. His mother and brother also help him with his finances if they need to.

## 2016-01-23 ENCOUNTER — Encounter: Payer: Self-pay | Admitting: Family Medicine

## 2016-01-23 NOTE — Telephone Encounter (Signed)
Short note has been written in his chart regarding this issue. Should be under today's date. I think it can be taken out and sent as a note itself or can be taken out and pasted on our letterhead. thanks

## 2016-01-23 NOTE — Telephone Encounter (Signed)
Done

## 2016-01-23 NOTE — Telephone Encounter (Signed)
To Whom It May Concern,    [name], date of birth 1961-03-23, is a patient of mine who recently moved back home with his mother after suffering a large stroke. He has been appropriately deemed stable and he is now able to handle his finances. His disability check should come to him under  his name.thank you for your consideration regarding this matter. Sincerely, Richard Lara Mulch.D.

## 2016-01-27 ENCOUNTER — Ambulatory Visit: Payer: PPO | Admitting: Speech Pathology

## 2016-01-27 DIAGNOSIS — R4701 Aphasia: Secondary | ICD-10-CM

## 2016-01-28 ENCOUNTER — Encounter: Payer: Self-pay | Admitting: Speech Pathology

## 2016-01-28 NOTE — Therapy (Signed)
[name] MAIN [facility] SERVICES [address] [place], [place], [address] Phone: [phone]   Fax:  [phone]  Speech Language Pathology Treatment  Patient Details  Name: Johnathan Campbell MRN: [id number] Date of Birth: 10-21-1961 Referring Provider: Dr. Manuella Ghazi  Encounter Date: 01/27/2016      End of Session - 01/28/16 1258    Visit Number 2   Number of Visits 9   Date for SLP Re-Evaluation 02/14/16   SLP Start Time 0900   SLP Stop Time  1000   SLP Time Calculation (min) 60 min   Activity Tolerance Patient tolerated treatment well      Past Medical History  Diagnosis Date  . Clot     blood clot  . Allergy   . Hypercholesterolemia   . Completed stroke ([place])   . Aphasia S/P CVA   . Acute intractable headache     unspecified headache type    Past Surgical History  Procedure Laterality Date  . Hernia repair  twice    There were no vitals filed for this visit.  Visit Diagnosis: Aphasia      Subjective Assessment - 01/28/16 1257    Subjective Patient returns with reports that his major speech and language concerns continue to revolve around comprehension and fluency.   Currently in Pain? No/denies               ADULT SLP TREATMENT - 01/28/16 0001    General Information   Behavior/Cognition Alert;Cooperative;Pleasant mood   Treatment Provided   Treatment provided Cognitive-Linquistic   Pain Assessment   Pain Assessment No/denies pain   Cognitive-Linquistic Treatment   Treatment focused on Aphasia   Skilled Treatment Patient was 60% accurate for answering two-unit yes-no questions; improved to 70% with one repetition of stimulus and prompts from the clinician to use multiple cues such as writing down key words, body language and movement of the clinician's articulators. Patient was 60% accurate for executing two-unit psychomotor commands; improved to 70% with one repetition of stimulus prompts from the clinician to use  multiple cues such as writing down key words, body language, and movement of the clinician's articulators.   Assessment / Recommendations / Plan   Plan Continue with current plan of care   Progression Toward Goals   Progression toward goals Progressing toward goals          SLP Education - 01/28/16 1257    Education provided Yes   Education Details strategies to improve comprehension and fluency   Person(s) Educated Patient   Methods Explanation;Demonstration   Comprehension Verbalized understanding;Returned demonstration            SLP Long Term Goals - 01/14/16 1050    SLP LONG TERM GOAL #1   Title Patient will generate grammatical and cogent sentence to complete simple/concrete linguistic task with 80% accuracy.   Time 4   Period Weeks   Status New   SLP LONG TERM GOAL #2   Title Patient will read aloud sentences and multi-syllabic words, maintaining phonemic accuracy, with 80% accuracy.     Time 4   Period Weeks   Status New   SLP LONG TERM GOAL #3   Title Patient will write grammatical and cogent sentence to complete abstract/complex linguistic task with 80% accuracy.   Time 4   Period Weeks   Status New   SLP LONG TERM GOAL #4   Title Patient will participate in the development of a home exercise program.  Time 4   Period Weeks   Status New          Plan - 01/28/16 1258    Clinical Impression Statement Patient displayed great potential for positive outcomes in speech therapy during today's initial therapy session. He has incorporated several strategies to enhance communication in his daily life including use of a Armed forces technical officer Tablet and various applications on his phone. Today's session focused primarily on comprehension and identifying strategies to improve comprehension such as attending to the clinician's articulators and writing down key words. Patient also identified improving his speech as an important goal for him; explore script training exercises to  target this area of his speech and language.    Speech Therapy Frequency 2x / week   Duration 4 weeks   Treatment/Interventions Language facilitation;Compensatory strategies;Patient/family education;SLP instruction and feedback   Potential to Achieve Goals Good   Potential Considerations Ability to learn/carryover information;Cooperation/participation level;Previous level of function;Severity of impairments;Family/community support   SLP Home Exercise Plan To be developed   Consulted and Agree with Plan of Care Patient        Problem List Patient Active Problem List   Diagnosis Date Noted  . Depression 01/16/2016  . Cephalalgia 06/03/2015  . Problems influencing health status 06/03/2015  . 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 Angiopathy 06/03/2015  . Completed stroke ([place]) 06/03/2015  . Hypercholesterolemia 06/03/2015    [name] 01/28/2016, 12:59 PM  [place] MAIN [facility] SERVICES [address] [place], [place], [address] Phone: [phone]   Fax:  [phone]   Name: Johnathan Campbell MRN: [id number] Date of Birth: 05-22-1961

## 2016-01-29 ENCOUNTER — Encounter: Payer: Self-pay | Admitting: Speech Pathology

## 2016-01-29 ENCOUNTER — Ambulatory Visit: Payer: PPO | Admitting: Speech Pathology

## 2016-01-29 DIAGNOSIS — R4701 Aphasia: Secondary | ICD-10-CM | POA: Diagnosis not present

## 2016-01-29 NOTE — Therapy (Signed)
[place] MAIN [facility] SERVICES [address] [place], [place], [address] Phone: [phone]   Fax:  [phone]  Speech Language Pathology Treatment  Patient Details  Name: Johnathan Campbell MRN: [id number] Date of Birth: 06-05-1961 Referring Provider: Dr. Manuella Ghazi  Encounter Date: 01/29/2016      End of Session - 01/29/16 1057    Visit Number 3   Number of Visits 9   Date for SLP Re-Evaluation 02/14/16   SLP Start Time 0900   SLP Stop Time  1000   SLP Time Calculation (min) 60 min   Activity Tolerance Patient tolerated treatment well      Past Medical History  Diagnosis Date  . Clot     blood clot  . Allergy   . Hypercholesterolemia   . Completed stroke ([place])   . Aphasia S/P CVA   . Acute intractable headache     unspecified headache type    Past Surgical History  Procedure Laterality Date  . Hernia repair  twice    There were no vitals filed for this visit.  Visit Diagnosis: Aphasia      Subjective Assessment - 01/29/16 1057    Subjective Patient returns with reports that his major speech and language concerns continue to revolve around comprehension and fluency.   Currently in Pain? No/denies               ADULT SLP TREATMENT - 01/29/16 0001    General Information   Behavior/Cognition Alert;Cooperative;Pleasant mood   Treatment Provided   Treatment provided Cognitive-Linquistic   Pain Assessment   Pain Assessment No/denies pain   Cognitive-Linquistic Treatment   Treatment focused on Aphasia   Skilled Treatment Patient generated cogent, grammatical sentences in response to clinician questions with 70% phonemic accuracy; improved to 90% phonemic accuracy with max support from clinician that included cues to attend to clinician's articulators, the written word, and to write the target words. Patient read aloud sentences from a self-generated script with 70% phonemic accuracy. Patient verbalized self-generated script  with 50% accuracy without cues.   Assessment / Recommendations / Plan   Plan Continue with current plan of care   Progression Toward Goals   Progression toward goals Progressing toward goals          SLP Education - 01/29/16 1057    Education provided Yes   Education Details strategies to improve comprehension and fluency   Person(s) Educated Patient   Methods Explanation;Demonstration   Comprehension Verbalized understanding;Returned demonstration            SLP Long Term Goals - 01/14/16 1050    SLP LONG TERM GOAL #1   Title Patient will generate grammatical and cogent sentence to complete simple/concrete linguistic task with 80% accuracy.   Time 4   Period Weeks   Status New   SLP LONG TERM GOAL #2   Title Patient will read aloud sentences and multi-syllabic words, maintaining phonemic accuracy, with 80% accuracy.     Time 4   Period Weeks   Status New   SLP LONG TERM GOAL #3   Title Patient will write grammatical and cogent sentence to complete abstract/complex linguistic task with 80% accuracy.   Time 4   Period Weeks   Status New   SLP LONG TERM GOAL #4   Title Patient will participate in the development of a home exercise program.   Time 4   Period Weeks   Status New  Plan - 01/29/16 1059    Clinical Impression Statement Patient continues to display great promise for positive outcomes in speech therapy through his positive attitude, motivation, and willingness to explore strategies that may benefit his communication. Today's session focused primarily on fluency and exploring strategies to improve speech. Script training, as well as cues to slow down and be patient, were successful in improving speech in general and fluency in particular. Inquire about Patient's awareness of TAP or other aphasia support groups during next session.        Problem List Patient Active Problem List   Diagnosis Date Noted  . Depression 01/16/2016  . Cephalalgia  06/03/2015  . Problems influencing health status 06/03/2015  . 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 Angiopathy 06/03/2015  . Completed stroke ([place]) 06/03/2015  . Hypercholesterolemia 06/03/2015    [name] 01/29/2016, 11:01 AM  [place] MAIN [facility] SERVICES [address] [place], [place], [address] Phone: [phone]   Fax:  [phone]   Name: Johnathan Campbell MRN: [id number] Date of Birth: 1961/07/13

## 2016-02-03 ENCOUNTER — Ambulatory Visit: Payer: PPO | Attending: Neurology | Admitting: Speech Pathology

## 2016-02-03 DIAGNOSIS — R4701 Aphasia: Secondary | ICD-10-CM | POA: Diagnosis not present

## 2016-02-04 ENCOUNTER — Encounter: Payer: Self-pay | Admitting: Speech Pathology

## 2016-02-04 NOTE — Therapy (Signed)
[place] MAIN [facility] SERVICES [address] [place], [place], [address] Phone: [phone]   Fax:  [phone]  Speech Language Pathology Treatment  Patient Details  Name: HILMON GRADNEY MRN: [id number] Date of Birth: [birth year] Referring Provider: Dr. Manuella Ghazi  Encounter Date: 02/03/2016      End of Session - 02/04/16 0843    Visit Number 4   Number of Visits 9   Date for SLP Re-Evaluation 02/14/16   SLP Start Time 1545   SLP Stop Time  1645   SLP Time Calculation (min) 60 min   Activity Tolerance Patient tolerated treatment well      Past Medical History  Diagnosis Date  . Clot     blood clot  . Allergy   . Hypercholesterolemia   . Completed stroke ([place])   . Aphasia S/P CVA   . Acute intractable headache     unspecified headache type    Past Surgical History  Procedure Laterality Date  . Hernia repair  twice    There were no vitals filed for this visit.  Visit Diagnosis: Aphasia      Subjective Assessment - 02/04/16 0843    Subjective Patient returns with reports that his major speech and language concerns continue to revolve around comprehension and fluency.   Currently in Pain? No/denies               ADULT SLP TREATMENT - 02/04/16 0001    General Information   Behavior/Cognition Alert;Cooperative;Pleasant mood   Treatment Provided   Treatment provided Cognitive-Linquistic   Pain Assessment   Pain Assessment No/denies pain   Cognitive-Linquistic Treatment   Treatment focused on Aphasia   Skilled Treatment Patient was 70% accurate for answering questions related to short news stories on "talkpath news" without written cues; improved to 80% with written cues. Patient generated cogent, grammatical sentences in response to clinician questions with 75% phonemic accuracy; improved to 90% phonemic accuracy with max support from clinician that included cues to attend to clinician's articulators, the written word, and  to write the target words. Patient read aloud sentences from a self-generated script with 80% phonemic accuracy. Patient verbalized self-generated script with 60% accuracy without cues.   Assessment / Recommendations / Plan   Plan Continue with current plan of care   Progression Toward Goals   Progression toward goals Progressing toward goals          SLP Education - 02/04/16 0843    Education provided Yes   Education Details strategies to improve comprehension and fluency   Person(s) Educated Patient   Methods Explanation;Demonstration;Handout   Comprehension Verbalized understanding;Returned demonstration            SLP Long Term Goals - 01/14/16 1050    SLP LONG TERM GOAL #1   Title Patient will generate grammatical and cogent sentence to complete simple/concrete linguistic task with 80% accuracy.   Time 4   Period Weeks   Status New   SLP LONG TERM GOAL #2   Title Patient will read aloud sentences and multi-syllabic words, maintaining phonemic accuracy, with 80% accuracy.     Time 4   Period Weeks   Status New   SLP LONG TERM GOAL #3   Title Patient will write grammatical and cogent sentence to complete abstract/complex linguistic task with 80% accuracy.   Time 4   Period Weeks   Status New   SLP LONG TERM GOAL #4   Title Patient will participate in the  development of a home exercise program.   Time 4   Period Weeks   Status New          Plan - 02/04/16 0844    Clinical Impression Statement Patient continues to display great promise for positive outcomes in speech therapy through his positive attitude, motivation, and willingness to explore strategies that may benefit his communication. Today's session focused on strategies to improve comprehension and fluency. "Talkpath News" was explored today and Clinician and Patient agreed that the website will be a great resource to improve auditory and reading comprehension. Script training, as well as cues to slow down  and be patient, continue to be successful strategies to improve speech in general and fluency in particular.   Speech Therapy Frequency 2x / week   Duration 4 weeks   Treatment/Interventions Language facilitation;Compensatory strategies;Patient/family education;SLP instruction and feedback   Potential to Achieve Goals Good   Potential Considerations Ability to learn/carryover information;Cooperation/participation level;Previous level of function;Severity of impairments;Family/community support   SLP Home Exercise Plan explore "talkpath news"; generate sentences about images and ask for feedback from family   Consulted and Agree with Plan of Care Patient        Problem List Patient Active Problem List   Diagnosis Date Noted  . Depression 01/16/2016  . Cephalalgia 06/03/2015  . Problems influencing health status 06/03/2015  . 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 Angiopathy 06/03/2015  . Completed stroke ([place]) 06/03/2015  . Hypercholesterolemia 06/03/2015    [name] 02/04/2016, 8:45 AM  [place] MAIN [facility] SERVICES [address] [place], [place], [address] Phone: [phone]   Fax:  [phone]   Name: SHEPHERD DOUBLEDAY MRN: [id number] Date of Birth: [birth year]

## 2016-02-05 ENCOUNTER — Ambulatory Visit: Payer: PPO | Admitting: Speech Pathology

## 2016-02-05 DIAGNOSIS — H4720 Unspecified optic atrophy: Secondary | ICD-10-CM | POA: Diagnosis not present

## 2016-02-05 DIAGNOSIS — R4701 Aphasia: Secondary | ICD-10-CM

## 2016-02-06 ENCOUNTER — Encounter: Payer: Self-pay | Admitting: Speech Pathology

## 2016-02-06 NOTE — Therapy (Signed)
[place] MAIN [facility] SERVICES [address] [place], [place], [address] Phone: [phone]   Fax:  [phone]  Speech Language Pathology Treatment  Patient Details  Name: Johnathan Campbell MRN: [id number] Date of Birth: 03/02/1961 Referring Provider: Dr. Manuella Ghazi  Encounter Date: 02/05/2016      End of Session - 02/06/16 1015    Visit Number 5   Number of Visits 9   Date for SLP Re-Evaluation 02/14/16   SLP Start Time 26   SLP Stop Time  1700   SLP Time Calculation (min) 60 min   Activity Tolerance Patient tolerated treatment well      Past Medical History  Diagnosis Date  . Clot     blood clot  . Allergy   . Hypercholesterolemia   . Completed stroke ([place])   . Aphasia S/P CVA   . Acute intractable headache     unspecified headache type    Past Surgical History  Procedure Laterality Date  . Hernia repair  twice    There were no vitals filed for this visit.  Visit Diagnosis: Aphasia      Subjective Assessment - 02/06/16 1015    Subjective Patient returns with reports that his major speech and language concerns continue to revolve around comprehension and fluency.   Currently in Pain? No/denies               ADULT SLP TREATMENT - 02/06/16 0001    General Information   Behavior/Cognition Alert;Cooperative;Pleasant mood   Treatment Provided   Treatment provided Cognitive-Linquistic   Pain Assessment   Pain Assessment No/denies pain   Cognitive-Linquistic Treatment   Treatment focused on Aphasia   Skilled Treatment Patient was 60% accurate for answering two unit processing questions that required yes/no responses; improved to 70% with one repetition of stimulus. Patient generated cogent, grammatical sentences in response to clinician questions with 75% phonemic accuracy; improved to 90% phonemic accuracy with max support from clinician that included cues to attend to clinician's articulators, the written word, and to  write the target words. Patient read aloud sentences from a self-generated script with 80% phonemic accuracy. Patient verbalized self-generated script with 65% accuracy without cues.   Assessment / Recommendations / Plan   Plan Continue with current plan of care   Progression Toward Goals   Progression toward goals Progressing toward goals          SLP Education - 02/06/16 1015    Education provided Yes   Education Details strategie sto improve comprehension and fluency   Person(s) Educated Patient   Methods Explanation;Demonstration   Comprehension Verbalized understanding;Returned demonstration            SLP Long Term Goals - 01/14/16 1050    SLP LONG TERM GOAL #1   Title Patient will generate grammatical and cogent sentence to complete simple/concrete linguistic task with 80% accuracy.   Time 4   Period Weeks   Status New   SLP LONG TERM GOAL #2   Title Patient will read aloud sentences and multi-syllabic words, maintaining phonemic accuracy, with 80% accuracy.     Time 4   Period Weeks   Status New   SLP LONG TERM GOAL #3   Title Patient will write grammatical and cogent sentence to complete abstract/complex linguistic task with 80% accuracy.   Time 4   Period Weeks   Status New   SLP LONG TERM GOAL #4   Title Patient will participate in the development of  a home exercise program.   Time 4   Period Weeks   Status New          Plan - 02/06/16 1015    Clinical Impression Statement Patient continues to display great promise for positive outcomes in speech therapy through his positive attitude, motivation, and willingness to explore strategies that may benefit his communication. Today's session focused on strategies to improve comprehension and fluency. Two unit processing questions were used to target auditory comprehension. Patient improved with repetition of questions, cues to attend to the clinician's articulators, and use of the written word. Patient was  encouraged to confront scenarios in which he does not comprehend, and to advocate for himself by asking communication partners to slow down and write down key words. "Talkpath News" was emphasized again as a resource to improve auditory comprehension at home. Script training, as well as cues to slow down and be patient, continue to be successful strategies to improve speech in general and fluency in particular.   Speech Therapy Frequency 2x / week   Duration 4 weeks   Treatment/Interventions Language facilitation;Compensatory strategies;Patient/family education;SLP instruction and feedback   Potential to Achieve Goals Good   Potential Considerations Ability to learn/carryover information;Cooperation/participation level;Previous level of function;Severity of impairments;Family/community support   SLP Home Exercise Plan explore "talkpath news"; generate sentences about images and ask for feedback from family   Consulted and Agree with Plan of Care Patient        Problem List Patient Active Problem List   Diagnosis Date Noted  . Depression 01/16/2016  . Cephalalgia 06/03/2015  . Problems influencing health status 06/03/2015  . 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 Angiopathy 06/03/2015  . Completed stroke ([place]) 06/03/2015  . Hypercholesterolemia 06/03/2015    [name] 02/06/2016, 10:16 AM  [place] MAIN [facility] SERVICES [address] [place], [place], [address] Phone: [phone]   Fax:  [phone]   Name: Johnathan Campbell MRN: [id number] Date of Birth: 08-06-1961

## 2016-02-10 ENCOUNTER — Ambulatory Visit: Payer: PPO | Admitting: Speech Pathology

## 2016-02-10 DIAGNOSIS — R4701 Aphasia: Secondary | ICD-10-CM

## 2016-02-11 ENCOUNTER — Encounter: Payer: Self-pay | Admitting: Speech Pathology

## 2016-02-11 NOTE — Therapy (Signed)
[place] MAIN [facility] SERVICES [address] [place], [place], [address] Phone: [phone]   Fax:  [phone]  Speech Language Pathology Treatment  Patient Details  Name: Johnathan Campbell MRN: [id number] Date of Birth: [birth year] Referring Provider: Dr. Manuella Ghazi  Encounter Date: 02/10/2016      End of Session - 02/11/16 0857    Visit Number 6   Number of Visits 9   Date for SLP Re-Evaluation 02/14/16   SLP Start Time 1   SLP Stop Time  1700   SLP Time Calculation (min) 60 min   Activity Tolerance Patient tolerated treatment well      Past Medical History  Diagnosis Date  . Clot     blood clot  . Allergy   . Hypercholesterolemia   . Completed stroke ([place])   . Aphasia S/P CVA   . Acute intractable headache     unspecified headache type    Past Surgical History  Procedure Laterality Date  . Hernia repair  twice    There were no vitals filed for this visit.      Subjective Assessment - 02/11/16 0856    Subjective Patient returns with reports that his major speech and language concerns continue to revolve around comprehension and fluency.   Currently in Pain? No/denies               ADULT SLP TREATMENT - 02/11/16 0001    General Information   Behavior/Cognition Alert;Cooperative;Pleasant mood   Treatment Provided   Treatment provided Cognitive-Linquistic   Pain Assessment   Pain Assessment No/denies pain   Cognitive-Linquistic Treatment   Treatment focused on Aphasia   Skilled Treatment Patient read aloud sentences from a self-generated script with 90% phonemic accuracy. Patient verbalized self-generated script with 75% accuracy without cues. Patient was 70% accurate for completing two-unit psychomotor commands; improved to 80% with one repetition of stimulus. Patient was 65% accurate for answering two unit processing questions that required yes/no responses; improved to 75% with one repetition of stimulus. Patient  generated cogent, grammatical sentences in response to clinician questions with 75% phonemic accuracy; improved to 90% phonemic accuracy with max support from clinician that included cues to attend to clinician's articulators, the written word, and to write the target words.    Assessment / Recommendations / Plan   Plan Continue with current plan of care   Progression Toward Goals   Progression toward goals Progressing toward goals          SLP Education - 02/11/16 0857    Education provided Yes   Education Details strategies to improve comprehension and fluency   Person(s) Educated Patient   Methods Explanation;Demonstration   Comprehension Verbalized understanding;Returned demonstration            SLP Long Term Goals - 01/14/16 1050    SLP LONG TERM GOAL #1   Title Patient will generate grammatical and cogent sentence to complete simple/concrete linguistic task with 80% accuracy.   Time 4   Period Weeks   Status New   SLP LONG TERM GOAL #2   Title Patient will read aloud sentences and multi-syllabic words, maintaining phonemic accuracy, with 80% accuracy.     Time 4   Period Weeks   Status New   SLP LONG TERM GOAL #3   Title Patient will write grammatical and cogent sentence to complete abstract/complex linguistic task with 80% accuracy.   Time 4   Period Weeks   Status New   SLP  LONG TERM GOAL #4   Title Patient will participate in the development of a home exercise program.   Time 4   Period Weeks   Status New          Plan - 02/11/16 0857    Clinical Impression Statement Patient continues to be engaged in our sessions with a positive attitude and willingness to explore strategies that may benefit his communication. Today's session focused on strategies to improve comprehension and fluency. Two unit psychomotor commands and two-unit processing questions were used to target auditory comprehension. Patient's comprehension improved with repetition of questions, cues  to attend to the clinician's articulators, and use of the written word. Patient was encouraged to confront scenarios in which he does not comprehend, and to advocate for himself by asking communication partners to slow down and write down key words. Patient and Clinician collaborated to write a script that will be used to advocate for himself during conversation, requesting conversation partners to be patient with him and to "speak clearly." "Talkpath News" was emphasized again as a resource to improve auditory comprehension at home.   Speech Therapy Frequency 2x / week   Duration 4 weeks   Treatment/Interventions Language facilitation;Compensatory strategies;Patient/family education;SLP instruction and feedback   Potential to Achieve Goals Good   Potential Considerations Ability to learn/carryover information;Cooperation/participation level;Previous level of function;Severity of impairments;Family/community support   SLP Home Exercise Plan explore "talkpath news"; generate sentences about images and ask for feedback from family   Consulted and Agree with Plan of Care Patient      Patient will benefit from skilled therapeutic intervention in order to improve the following deficits and impairments:   Aphasia    Problem List Patient Active Problem List   Diagnosis Date Noted  . Depression 01/16/2016  . Cephalalgia 06/03/2015  . Problems influencing health status 06/03/2015  . 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 Angiopathy 06/03/2015  . Completed stroke ([place]) 06/03/2015  . Hypercholesterolemia 06/03/2015    [name] 02/11/2016, 8:58 AM  [place] MAIN [facility] SERVICES [address] [place], [place], [address] Phone: [phone]   Fax:  [phone]   Name: BADER LELLO MRN: [id number] Date of Birth: 06/19/1961

## 2016-02-12 ENCOUNTER — Ambulatory Visit: Payer: PPO | Admitting: Speech Pathology

## 2016-02-12 DIAGNOSIS — R4701 Aphasia: Secondary | ICD-10-CM

## 2016-02-13 ENCOUNTER — Encounter: Payer: Self-pay | Admitting: Speech Pathology

## 2016-02-13 NOTE — Therapy (Signed)
[place] MAIN [facility] SERVICES [address] [place], [place], [address] Phone: [phone]   Fax:  [phone]  Speech Language Pathology Treatment  Patient Details  Name: Johnathan Campbell MRN: [id number] Date of Birth: September 19, 1961 Referring Provider: Dr. Manuella Ghazi  Encounter Date: 02/12/2016      End of Session - 02/13/16 1635    Visit Number 7   Number of Visits 9   Date for SLP Re-Evaluation 02/14/16   SLP Start Time [id number]   SLP Stop Time  1700   SLP Time Calculation (min) 55 min   Activity Tolerance Patient tolerated treatment well      Past Medical History  Diagnosis Date  . Clot     blood clot  . Allergy   . Hypercholesterolemia   . Completed stroke ([place])   . Aphasia S/P CVA   . Acute intractable headache     unspecified headache type    Past Surgical History  Procedure Laterality Date  . Hernia repair  twice    There were no vitals filed for this visit.      Subjective Assessment - 02/13/16 1635    Subjective Patient returns with reports that his major speech and language concerns continue to revolve around comprehension and fluency.               ADULT SLP TREATMENT - 02/13/16 0001    General Information   Behavior/Cognition Alert;Cooperative;Pleasant mood   Treatment Provided   Treatment provided Cognitive-Linquistic   Pain Assessment   Pain Assessment No/denies pain   Cognitive-Linquistic Treatment   Treatment focused on Aphasia   Skilled Treatment Patient read aloud sentences from a self-generated script with 90% phonemic accuracy. Patient verbalized self-generated script with 70% accuracy without cues. Patient was 70% accurate for answering two unit processing questions that required yes/no responses; improved to 80% with one repetition of stimulus. Patient generated cogent, grammatical sentences in response to clinician questions with 75% phonemic accuracy; improved to 90% phonemic accuracy with max support  from clinician that included cues to attend to clinician's articulators, the written word, and to write the target words.    Assessment / Recommendations / Plan   Plan Continue with current plan of care   Progression Toward Goals   Progression toward goals Progressing toward goals          SLP Education - 02/13/16 1635    Education provided Yes   Education Details strategies to improve comprehension and fluency   Person(s) Educated Patient   Methods Explanation;Demonstration   Comprehension Verbalized understanding;Returned demonstration            SLP Long Term Goals - 01/14/16 1050    SLP LONG TERM GOAL #1   Title Patient will generate grammatical and cogent sentence to complete simple/concrete linguistic task with 80% accuracy.   Time 4   Period Weeks   Status New   SLP LONG TERM GOAL #2   Title Patient will read aloud sentences and multi-syllabic words, maintaining phonemic accuracy, with 80% accuracy.     Time 4   Period Weeks   Status New   SLP LONG TERM GOAL #3   Title Patient will write grammatical and cogent sentence to complete abstract/complex linguistic task with 80% accuracy.   Time 4   Period Weeks   Status New   SLP LONG TERM GOAL #4   Title Patient will participate in the development of a home exercise program.   Time 4  Period Weeks   Status New          Plan - 02/13/16 1636    Clinical Impression Statement Patient continues to be engaged in our sessions with a positive attitude and willingness to explore strategies that may benefit his communication. Today's session focused on strategies to improve comprehension and fluency. Two unit processing questions were used to target auditory comprehension and Patient's comprehension improved with repetition of questions, cues to attend to the clinician's articulators, and use of the written word. Patient was encouraged to confront scenarios in which he does not comprehend, and to advocate for himself by  asking communication partners to slow down and write down key words. Patient continues to improve with verbalizing a script that will be used to advocate for himself during conversation, requesting conversation partners to be patient with him and to "speak clearly." Mr. Chevere plans to work on ideas for a new script for next session.   Speech Therapy Frequency 2x / week   Duration 4 weeks   Treatment/Interventions Language facilitation;Compensatory strategies;Patient/family education;SLP instruction and feedback   Potential to Achieve Goals Good   Potential Considerations Ability to learn/carryover information;Cooperation/participation level;Previous level of function;Severity of impairments;Family/community support   SLP Home Exercise Plan explore "talkpath news"; generate sentences about images and ask for feedback from family   Consulted and Agree with Plan of Care Patient      Patient will benefit from skilled therapeutic intervention in order to improve the following deficits and impairments:   Aphasia    Problem List Patient Active Problem List   Diagnosis Date Noted  . Depression 01/16/2016  . Cephalalgia 06/03/2015  . Problems influencing health status 06/03/2015  . 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 Angiopathy 06/03/2015  . Completed stroke ([place]) 06/03/2015  . Hypercholesterolemia 06/03/2015    [name] 02/13/2016, 4:36 PM  [place] MAIN [facility] SERVICES [address] [place], [place], [address] Phone: [phone]   Fax:  [phone]   Name: Johnathan Campbell MRN: [id number] Date of Birth: [birth year]

## 2016-02-17 ENCOUNTER — Ambulatory Visit: Payer: PPO | Admitting: Speech Pathology

## 2016-02-17 DIAGNOSIS — R4701 Aphasia: Secondary | ICD-10-CM | POA: Diagnosis not present

## 2016-02-18 ENCOUNTER — Encounter: Payer: Self-pay | Admitting: Speech Pathology

## 2016-02-18 NOTE — Therapy (Signed)
[place] MAIN [facility] SERVICES [address] [place], [place], [address] Phone: [phone]   Fax:  [phone]  Speech Language Pathology Treatment  Patient Details  Name: Johnathan Campbell MRN: [id number] Date of Birth: February 07, 1961 Referring Provider: Dr. Manuella Ghazi  Encounter Date: 02/17/2016      End of Session - 02/18/16 1609    Visit Number 8   Number of Visits 9   Date for SLP Re-Evaluation 02/14/16   SLP Start Time 65   SLP Stop Time  1700   SLP Time Calculation (min) 60 min   Activity Tolerance Patient tolerated treatment well      Past Medical History  Diagnosis Date  . Clot     blood clot  . Allergy   . Hypercholesterolemia   . Completed stroke ([place])   . Aphasia S/P CVA   . Acute intractable headache     unspecified headache type    Past Surgical History  Procedure Laterality Date  . Hernia repair  twice    There were no vitals filed for this visit.      Subjective Assessment - 02/18/16 1317    Subjective (p) Patient returns with reports that his major speech and language concerns continue to revolve around comprehension and fluency.   Currently in Pain? (p) No/denies               ADULT SLP TREATMENT - 02/18/16 0001    General Information   Behavior/Cognition Alert;Cooperative;Pleasant mood   Treatment Provided   Treatment provided Cognitive-Linquistic   Pain Assessment   Pain Assessment No/denies pain   Cognitive-Linquistic Treatment   Treatment focused on Aphasia   Skilled Treatment Patient was 70% accurate for answering two unit processing questions that required yes/no responses; improved to 80% with one repetition of stimulus. Patient was 60% accurate for executing two-unit psychomotor commands; improved to 70% with one repetition of stimulus. Patient read aloud sentences from a self-generated script with 90% phonemic accuracy. Patient verbalized self-generated script with 70% accuracy without cues.  Patient answered questions related to short news stories on "talkpath news" with 70% accuracy with min cues.   Assessment / Recommendations / Plan   Plan Continue with current plan of care   Progression Toward Goals   Progression toward goals Progressing toward goals          SLP Education - 02/18/16 1608    Education provided Yes   Education Details strategies to improve comprehension and fluency   Person(s) Educated Patient   Methods Explanation;Demonstration   Comprehension Verbalized understanding;Returned demonstration            SLP Long Term Goals - 01/14/16 1050    SLP LONG TERM GOAL #1   Title Patient will generate grammatical and cogent sentence to complete simple/concrete linguistic task with 80% accuracy.   Time 4   Period Weeks   Status New   SLP LONG TERM GOAL #2   Title Patient will read aloud sentences and multi-syllabic words, maintaining phonemic accuracy, with 80% accuracy.     Time 4   Period Weeks   Status New   SLP LONG TERM GOAL #3   Title Patient will write grammatical and cogent sentence to complete abstract/complex linguistic task with 80% accuracy.   Time 4   Period Weeks   Status New   SLP LONG TERM GOAL #4   Title Patient will participate in the development of a home exercise program.   Time 4  Period Weeks   Status New          Plan - 02/18/16 1609    Clinical Impression Statement Patient continues to be engaged in our sessions with a positive attitude and willingness to explore strategies that may benefit his communication. Today's session focused on strategies to improve comprehension and fluency. Two unit processing questions, two unit psychomotor commands, and short news stories on "talkpath news" were used to target auditory comprehension. Patient's comprehension improved with repetition of questions, cues to attend to the clinician's articulators, and use of the written word. Patient was encouraged to confront scenarios in which  he does not comprehend, and to advocate for himself by asking communication partners to slow down and write down key words. Patient noted that he will soon regain his driving privileges; he was encouraged to attend meetings at an aphasia support group in [place] called the [employer]. Patient continues to improve with verbalizing a script that will be used to advocate for himself during conversation, requesting conversation partners to be patient with him and to "speak clearly." Mr. Sepanski plans to work on ideas for a new script for next session.   Speech Therapy Frequency 2x / week   Duration 4 weeks   Treatment/Interventions Language facilitation;Compensatory strategies;Patient/family education;SLP instruction and feedback   Potential to Achieve Goals Good   Potential Considerations Ability to learn/carryover information;Cooperation/participation level;Previous level of function;Severity of impairments;Family/community support   SLP Home Exercise Plan explore "talkpath news"; generate sentences about images and ask for feedback from family   Consulted and Agree with Plan of Care Patient      Patient will benefit from skilled therapeutic intervention in order to improve the following deficits and impairments:   Aphasia    Problem List Patient Active Problem List   Diagnosis Date Noted  . Depression 01/16/2016  . Cephalalgia 06/03/2015  . Problems influencing health status 06/03/2015  . 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 Angiopathy 06/03/2015  . Completed stroke ([place]) 06/03/2015  . Hypercholesterolemia 06/03/2015    Johnathan Campbell 02/18/2016, 4:10 PM  [place] MAIN [facility] SERVICES [address] [place], [place], [address] Phone: [phone]   Fax:  [phone]   Name: Johnathan Campbell MRN: [id number] Date of Birth: [birth year]

## 2016-02-19 ENCOUNTER — Encounter: Payer: Self-pay | Admitting: Speech Pathology

## 2016-02-19 ENCOUNTER — Ambulatory Visit: Payer: PPO | Admitting: Speech Pathology

## 2016-02-19 DIAGNOSIS — R4701 Aphasia: Secondary | ICD-10-CM | POA: Diagnosis not present

## 2016-02-19 NOTE — Therapy (Signed)
[place] MAIN [facility] SERVICES [address] [place], [place], [address] Phone: [phone]   Fax:  [phone]  Speech Language Pathology Treatment / Discharge Note  Patient Details  Name: KEYAN FOLSON MRN: [id number] Date of Birth: 05-Jan-1961 Referring Provider: Dr. Manuella Ghazi  Encounter Date: 02/19/2016      End of Session - 02/19/16 1735    Visit Number 9   Number of Visits 9   Date for SLP Re-Evaluation 02/14/16   SLP Start Time 93   SLP Stop Time  1700   SLP Time Calculation (min) 60 min   Activity Tolerance Patient tolerated treatment well      Past Medical History  Diagnosis Date  . Clot     blood clot  . Allergy   . Hypercholesterolemia   . Completed stroke ([place])   . Aphasia S/P CVA   . Acute intractable headache     unspecified headache type    Past Surgical History  Procedure Laterality Date  . Hernia repair  twice    There were no vitals filed for this visit.      Subjective Assessment - 02/19/16 1735    Subjective Patient returns with reports that his major speech and language concerns continue to revolve around comprehension and fluency.   Currently in Pain? No/denies               ADULT SLP TREATMENT - 02/19/16 0001    General Information   Behavior/Cognition Alert;Cooperative;Pleasant mood   Treatment Provided   Treatment provided Cognitive-Linquistic   Pain Assessment   Pain Assessment No/denies pain   Cognitive-Linquistic Treatment   Treatment focused on Aphasia   Skilled Treatment Patient read aloud sentences from a self-generated script with 90% phonemic accuracy. Patient repeated short phrases with 70% phonemic accuracy; improved to 80% with one repetition of stimulus.  Patient was 80% accurate for answering questions that probed for understanding of directions given by clinician.    Assessment / Recommendations / Plan   Plan All goals met   Progression Toward Goals   Progression toward  goals Goals met, education completed, patient discharged from SLP          SLP Education - 02/19/16 1735    Education provided Yes   Education Details strategies to improve comprehension and fluency   Person(s) Educated Patient   Methods Explanation;Demonstration;Handout   Comprehension Verbalized understanding;Returned demonstration            SLP Long Term Goals - 02/19/16 1736    SLP LONG TERM GOAL #1   Title Patient will generate grammatical and cogent sentence to complete simple/concrete linguistic task with 80% accuracy.   Status Achieved   SLP LONG TERM GOAL #2   Title Patient will read aloud sentences and multi-syllabic words, maintaining phonemic accuracy, with 80% accuracy.     Status Achieved   SLP LONG TERM GOAL #3   Title Patient will write grammatical and cogent sentence to complete abstract/complex linguistic task with 80% accuracy.   Status Achieved   SLP LONG TERM GOAL #4   Title Patient will participate in the development of a home exercise program.   Status Achieved          Plan - 02/19/16 1736    Clinical Impression Statement Today's session focused on a review of all of the strategies we have gone over that will be used to improve comprehension and fluency. Mr. Bulger was provided with a thorough list of exercises  for continued at-home practice that included: Reading aloud to improve fluency, doing the exercises that target reading, writing, listening, and speaking on his Talk Path iPad app every day for at least an hour, using old photographs of his family to engage in conversation, and writing scripts to practice phrases that he wants to say on a regular basis. Patient was receptive to this plan. Patient was counseled to take advantage of strategies that improve his comprehension including to attending to the conversational partner's articulators, asking partners to slow their rate, and use of the written word. Patient was encouraged to confront  scenarios in which he does not comprehend, and to advocate for himself by asking communication partners to slow down and write down key words. Patient noted that he will soon regain his driving privileges; he was encouraged to attend meetings at an aphasia support group in [place] called the [employer].    Speech Therapy Frequency 2x / week   Duration 4 weeks   Treatment/Interventions Language facilitation;Compensatory strategies;Patient/family education;SLP instruction and feedback   Potential to Achieve Goals Good   Potential Considerations Ability to learn/carryover information;Cooperation/participation level;Previous level of function;Severity of impairments;Family/community support   SLP Home Exercise Plan explore "talkpath news"; generate sentences about images and ask for feedback from family   Consulted and Agree with Plan of Care Patient      Patient will benefit from skilled therapeutic intervention in order to improve the following deficits and impairments:   Aphasia    Problem List Patient Active Problem List   Diagnosis Date Noted  . Depression 01/16/2016  . Cephalalgia 06/03/2015  . Problems influencing health status 06/03/2015  . 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 Angiopathy 06/03/2015  . Completed stroke ([place]) 06/03/2015  . Hypercholesterolemia 06/03/2015    [name] 02/19/2016, 5:37 PM  [place] MAIN [facility] SERVICES [address] [place], [place], [address] Phone: [phone]   Fax:  [phone]   Name: BOSTEN NEWSTROM MRN: [id number] Date of Birth: [birth year]

## 2016-04-14 ENCOUNTER — Other Ambulatory Visit: Payer: Self-pay | Admitting: Family Medicine

## 2016-04-24 DIAGNOSIS — R4701 Aphasia: Secondary | ICD-10-CM | POA: Diagnosis not present

## 2016-04-24 DIAGNOSIS — I639 Cerebral infarction, unspecified: Secondary | ICD-10-CM | POA: Diagnosis not present

## 2016-05-06 ENCOUNTER — Other Ambulatory Visit: Payer: Self-pay | Admitting: Neurology

## 2016-05-06 DIAGNOSIS — IMO0002 Reserved for concepts with insufficient information to code with codable children: Secondary | ICD-10-CM

## 2016-05-19 ENCOUNTER — Ambulatory Visit
Admission: RE | Admit: 2016-05-19 | Discharge: 2016-05-19 | Disposition: A | Discharge status: Home / Self Care | Payer: PPO | Source: Ambulatory Visit | Attending: Neurology | Admitting: Neurology

## 2016-05-19 DIAGNOSIS — R4701 Aphasia: Secondary | ICD-10-CM | POA: Diagnosis not present

## 2016-05-19 DIAGNOSIS — I63512 Cerebral infarction due to unspecified occlusion or stenosis of left middle cerebral artery: Secondary | ICD-10-CM | POA: Insufficient documentation

## 2016-05-19 DIAGNOSIS — IMO0002 Reserved for concepts with insufficient information to code with codable children: Secondary | ICD-10-CM

## 2016-05-19 DIAGNOSIS — I639 Cerebral infarction, unspecified: Secondary | ICD-10-CM | POA: Diagnosis not present

## 2016-05-19 MED ORDER — GADOBENATE DIMEGLUMINE 529 MG/ML IV SOLN
20.0000 mL | Freq: Once | INTRAVENOUS | Status: AC | PRN
Start: 2016-05-19 — End: 2016-05-19
  Administered 2016-05-19: 17 mL via INTRAVENOUS

## 2016-06-10 NOTE — Telephone Encounter (Signed)
error 

## 2016-06-29 DIAGNOSIS — R4701 Aphasia: Secondary | ICD-10-CM | POA: Diagnosis not present

## 2016-06-29 DIAGNOSIS — I639 Cerebral infarction, unspecified: Secondary | ICD-10-CM | POA: Diagnosis not present

## 2016-07-23 ENCOUNTER — Ambulatory Visit (INDEPENDENT_AMBULATORY_CARE_PROVIDER_SITE_OTHER): Payer: PPO | Admitting: Family Medicine

## 2016-07-23 ENCOUNTER — Encounter: Payer: Self-pay | Admitting: Family Medicine

## 2016-07-23 VITALS — BP 122/84 | HR 72 | Temp 98.2°F | Resp 16 | Ht 69.0 in | Wt 170.0 lb

## 2016-07-23 DIAGNOSIS — I69920 Aphasia following unspecified cerebrovascular disease: Secondary | ICD-10-CM | POA: Diagnosis not present

## 2016-07-23 DIAGNOSIS — Z125 Encounter for screening for malignant neoplasm of prostate: Secondary | ICD-10-CM | POA: Diagnosis not present

## 2016-07-23 DIAGNOSIS — Z23 Encounter for immunization: Secondary | ICD-10-CM

## 2016-07-23 DIAGNOSIS — F329 Major depressive disorder, single episode, unspecified: Secondary | ICD-10-CM | POA: Diagnosis not present

## 2016-07-23 DIAGNOSIS — Z1211 Encounter for screening for malignant neoplasm of colon: Secondary | ICD-10-CM

## 2016-07-23 DIAGNOSIS — E7800 Pure hypercholesterolemia, unspecified: Secondary | ICD-10-CM

## 2016-07-23 DIAGNOSIS — Z0000 Encounter for general adult medical examination without abnormal findings: Secondary | ICD-10-CM

## 2016-07-23 DIAGNOSIS — F32A Depression, unspecified: Secondary | ICD-10-CM

## 2016-07-23 LAB — POCT URINALYSIS DIPSTICK
BILIRUBIN UA: NEGATIVE
Blood, UA: NEGATIVE
Glucose, UA: NEGATIVE
KETONES UA: NEGATIVE
LEUKOCYTES UA: NEGATIVE
Nitrite, UA: NEGATIVE
PH UA: 6
Protein, UA: NEGATIVE
Spec Grav, UA: 1.015
Urobilinogen, UA: 0.2

## 2016-07-23 LAB — IFOBT (OCCULT BLOOD): IFOBT: NEGATIVE

## 2016-07-23 NOTE — Progress Notes (Signed)
Patient: Johnathan Campbell, Male    DOB: November 26, 1960, 55 y.o.   MRN: [id number] Visit Date: 07/23/2016  Today's Provider: Wilhemena Durie, MD   Chief Complaint  Patient presents with  . Medicare Wellness   Subjective:   Johnathan Campbell is a 55 y.o. male who presents today for his Subsequent Annual Wellness Visit. He feels well. He reports exercising daily walking, running [facility] He reports he is sleeping well.  Last Td 03/31/05  Colonoscopy-per patient he went to [place]  about 2 to 3 years ago  Review of Systems  Constitutional: Negative.   HENT: Negative.   Eyes: Negative.   Respiratory: Negative.   Cardiovascular: Negative.   Gastrointestinal: Negative.   Endocrine: Negative.   Genitourinary: Negative.   Musculoskeletal: Negative.   Skin: Negative.   Allergic/Immunologic: Negative.   Neurological: Negative.   Hematological: Negative.   Psychiatric/Behavioral: Negative.     Patient Active Problem List   Diagnosis Date Noted  . Depression 01/16/2016  . Cephalalgia 06/03/2015  . Problems influencing health status 06/03/2015  . 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 Angiopathy 06/03/2015  . Completed stroke ([place]) 06/03/2015  . Hypercholesterolemia 06/03/2015    Social History   Social History  . Marital status: Legally Separated    Spouse name: N/A  . Number of children: N/A  . Years of education: N/A   Occupational History  . Not on file.   Social History Main Topics  . Smoking status: Never Smoker  . Smokeless tobacco: Never Used  . Alcohol use No  . Drug use: No  . Sexual activity: No   Other Topics Concern  . Not on file   Social History Narrative  . No narrative on file    Past Surgical History:  Procedure Laterality Date  . HERNIA REPAIR  twice    His family history includes Hyperlipidemia in his father and mother; Hypertension in his father and mother.    Outpatient Encounter Prescriptions as of  07/23/2016  Medication Sig Note  . aspirin 325 MG tablet Take 1 tablet by mouth daily. 06/03/2015: Received from: [place]: Take by mouth.  Marland Kitchen atorvastatin (LIPITOR) 80 MG tablet Take 1 tablet (80 mg total) by mouth at bedtime.   . montelukast (SINGULAIR) 10 MG tablet Take 1 tablet (10 mg total) by mouth at bedtime.   . naproxen (NAPROSYN) 500 MG tablet TAKE 1 TABLET (500 MG TOTAL) BY MOUTH 2 (TWO) TIMES DAILY WITH A MEAL.   Marland Kitchen venlafaxine (EFFEXOR) 75 MG tablet Take 1 tablet (75 mg total) by mouth 2 (two) times daily.    No facility-administered encounter medications on file as of 07/23/2016.     No Known Allergies  Patient Care Team: Jerrol Banana., MD as PCP - General (Family Medicine)  Objective:   Vitals:  Vitals:   07/23/16 0838  BP: 122/84  Pulse: 72  Resp: 16  Temp: 98.2 F (36.8 C)  Weight: 170 lb (77.1 kg)  Height: 5\' 9"  (1.753 m)    Physical Exam  Constitutional: He is oriented to person, place, and time. He appears well-developed and well-nourished.  HENT:  Head: Normocephalic and atraumatic.  Right Ear: External ear normal.  Left Ear: External ear normal.  Nose: Nose normal.  Mouth/Throat: Oropharynx is clear and moist.  Eyes: Conjunctivae are normal. Pupils are equal, round, and reactive to light.  Neck: Normal range of motion. Neck supple.  Cardiovascular: Normal rate, regular rhythm, normal heart sounds and intact distal pulses.   No murmur heard. Pulmonary/Chest: Effort normal and breath sounds normal. No respiratory distress. He has no wheezes.  Abdominal: Soft. He exhibits no distension. There is no tenderness.  Genitourinary: Rectum normal, prostate normal and penis normal. Rectal exam shows guaiac negative stool. No penile tenderness.  Musculoskeletal: He exhibits no edema or tenderness.  Neurological: He is alert and oriented to person, place, and time.  Skin: Skin is warm and dry. No rash noted. No erythema.   Psychiatric: He has a normal mood and affect. His behavior is normal. Judgment and thought content normal.    Activities of Daily Living In your present state of health, do you have any difficulty performing the following activities: 07/23/2016  Hearing? N  Vision? N  Difficulty concentrating or making decisions? N  Walking or climbing stairs? N  Dressing or bathing? N  Doing errands, shopping? N  Some recent data might be hidden    Fall Risk Assessment Fall Risk  07/23/2016 11/18/2015 07/15/2015  Falls in the past year? No No No     Depression Screen PHQ 2/9 Scores 07/23/2016 11/18/2015 07/15/2015  PHQ - 2 Score 0 3 0  PHQ- 9 Score - 9 -     Assessment & Plan:     Annual Wellness Visit  Reviewed patient's Family Medical History Reviewed and updated list of patient's medical providers Assessment of cognitive impairment was done Assessed patient's functional ability Established a written schedule for health screening [place] Completed and Reviewed   1. Medicare Wellness visit   2. Colon cancer screening Will get records from Dr [facility] for his colonoscopy 3. 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 Status post CVA.Post CVA. Per neurology. 4. Hypercholesterolemia  5. Depression  6. Need for influenza vaccination - Flu Vaccine QUAD 36+ mos PF IM (Fluarix & Fluzone Quad PF) Needs Tdap, advised patient to check with insurance on coverage.  HPI, Exam and A&P transcribed under direction and in the presence of Miguel Aschoff, MD. I have done the exam and reviewed the chart and it is accurate to the best of my knowledge. Miguel Aschoff M.D. [facility]

## 2016-07-24 DIAGNOSIS — I69920 Aphasia following unspecified cerebrovascular disease: Secondary | ICD-10-CM | POA: Diagnosis not present

## 2016-07-24 DIAGNOSIS — E7800 Pure hypercholesterolemia, unspecified: Secondary | ICD-10-CM | POA: Diagnosis not present

## 2016-07-24 DIAGNOSIS — F329 Major depressive disorder, single episode, unspecified: Secondary | ICD-10-CM | POA: Diagnosis not present

## 2016-07-24 DIAGNOSIS — Z125 Encounter for screening for malignant neoplasm of prostate: Secondary | ICD-10-CM | POA: Diagnosis not present

## 2016-07-24 DIAGNOSIS — Z0000 Encounter for general adult medical examination without abnormal findings: Secondary | ICD-10-CM | POA: Diagnosis not present

## 2016-07-25 LAB — CBC WITH DIFFERENTIAL/PLATELET
BASOS ABS: 0 10*3/uL (ref 0.0–0.2)
Basos: 0 %
EOS (ABSOLUTE): 0.1 10*3/uL (ref 0.0–0.4)
Eos: 1 %
Hematocrit: 43.3 % (ref 37.5–51.0)
Hemoglobin: 14.8 g/dL (ref 12.6–17.7)
IMMATURE GRANULOCYTES: 0 %
Immature Grans (Abs): 0 10*3/uL (ref 0.0–0.1)
LYMPHS ABS: 1.3 10*3/uL (ref 0.7–3.1)
Lymphs: 24 %
MCH: 31.3 pg (ref 26.6–33.0)
MCHC: 34.2 g/dL (ref 31.5–35.7)
MCV: 92 fL (ref 79–97)
MONOS ABS: 0.6 10*3/uL (ref 0.1–0.9)
Monocytes: 10 %
NEUTROS PCT: 65 %
Neutrophils Absolute: 3.6 10*3/uL (ref 1.4–7.0)
PLATELETS: 214 10*3/uL (ref 150–379)
RBC: 4.73 x10E6/uL (ref 4.14–5.80)
RDW: 13.4 % (ref 12.3–15.4)
WBC: 5.6 10*3/uL (ref 3.4–10.8)

## 2016-07-25 LAB — COMPREHENSIVE METABOLIC PANEL
A/G RATIO: 1.6 (ref 1.2–2.2)
ALBUMIN: 3.9 g/dL (ref 3.5–5.5)
ALT: 31 IU/L (ref 0–44)
AST: 28 IU/L (ref 0–40)
Alkaline Phosphatase: 80 IU/L (ref 39–117)
BUN / CREAT RATIO: 20 (ref 9–20)
BUN: 19 mg/dL (ref 6–24)
Bilirubin Total: 1 mg/dL (ref 0.0–1.2)
CALCIUM: 9.3 mg/dL (ref 8.7–10.2)
CO2: 26 mmol/L (ref 18–29)
Chloride: 104 mmol/L (ref 96–106)
Creatinine, Ser: 0.94 mg/dL (ref 0.76–1.27)
GFR, EST AFRICAN AMERICAN: 104 mL/min/{1.73_m2} (ref >59)
GFR, EST NON AFRICAN AMERICAN: 90 mL/min/{1.73_m2} (ref >59)
GLOBULIN, TOTAL: 2.5 g/dL (ref 1.5–4.5)
Glucose: 87 mg/dL (ref 65–99)
POTASSIUM: 5 mmol/L (ref 3.5–5.2)
Sodium: 143 mmol/L (ref 134–144)
TOTAL PROTEIN: 6.4 g/dL (ref 6.0–8.5)

## 2016-07-25 LAB — LIPID PANEL WITH LDL/HDL RATIO
CHOLESTEROL TOTAL: 135 mg/dL (ref 100–199)
HDL: 62 mg/dL (ref >39)
LDL Calculated: 64 mg/dL (ref 0–99)
LDL/HDL RATIO: 1 ratio (ref 0.0–3.6)
TRIGLYCERIDES: 45 mg/dL (ref 0–149)
VLDL Cholesterol Cal: 9 mg/dL (ref 5–40)

## 2016-07-25 LAB — TSH: TSH: 0.736 u[IU]/mL (ref 0.450–4.500)

## 2016-07-25 LAB — PSA: Prostate Specific Ag, Serum: 2.6 ng/mL (ref 0.0–4.0)

## 2016-07-27 ENCOUNTER — Telehealth: Payer: Self-pay

## 2016-07-27 NOTE — Telephone Encounter (Signed)
Left message to call back  

## 2016-07-27 NOTE — Telephone Encounter (Signed)
Advised patient of results.  

## 2016-07-27 NOTE — Telephone Encounter (Signed)
-----   Message from Jerrol Banana., MD sent at 07/25/2016  7:00 AM EDT ----- Labs stable

## 2016-07-29 ENCOUNTER — Encounter: Payer: Self-pay | Admitting: Family Medicine

## 2016-08-04 ENCOUNTER — Telehealth: Payer: Self-pay | Admitting: Family Medicine

## 2016-08-04 NOTE — Telephone Encounter (Signed)
Pt has been having frequent nose bleeds.  He had a really bad one night before last.  He takes 325 mg of asprin everyday because of a stroke he had several years ago    Please advise  [phone]  Thanks, Con Memos

## 2016-08-04 NOTE — Telephone Encounter (Signed)
Go to every other day aspirin.

## 2016-08-04 NOTE — Telephone Encounter (Signed)
Mother Rhae Lerner

## 2016-08-04 NOTE — Telephone Encounter (Signed)
Please review-aa 

## 2016-10-07 ENCOUNTER — Telehealth: Payer: Self-pay | Admitting: Family Medicine

## 2016-10-07 NOTE — Telephone Encounter (Signed)
Pt mom Fraser Din is returning call.  She states this is about a form that needs to be completed.  [id number]

## 2016-10-07 NOTE — Telephone Encounter (Signed)
Spoke with Pat-aa

## 2016-11-11 ENCOUNTER — Other Ambulatory Visit: Payer: Self-pay | Admitting: Family Medicine

## 2016-11-16 ENCOUNTER — Telehealth: Payer: Self-pay | Admitting: Family Medicine

## 2016-11-16 NOTE — Telephone Encounter (Signed)
Pt's mom Fraser Din would like a call back to discuss pt's nose bleeds. Fraser Din stated that Dr. Rosanna Randy had changed the dosage of pt's Asprin due to the nose bleeds but pt is still having nose bleeds. Fraser Din was advised that Dr. Rosanna Randy is out of the office today. Please advise. Thanks TNP

## 2016-11-16 NOTE — Telephone Encounter (Signed)
Spoke with Johnathan Campbell. First message was taking on 08/04/16 when patient was having nose bleeds frequently and Dr Rosanna Randy advised him to take Aspirin 325 mg every other day. [name] states nose bleeds are still present but not as frequent-about 3 times a week. Usually gets them when he is laying down and it take about 15 to 20 minutes for them to stop. He has one this morning but this time he was walking around. Patient does not complain or mention any other symptoms when he gets nose bleeds. Not in distress. Fraser Din states he had a headache the other day that felt like when he had stroke but has felt fine ever since and even went for a run that day. Please review. Dr Rosanna Randy out of the office-aa

## 2016-11-16 NOTE — Telephone Encounter (Signed)
[name] advised. Dr Rosanna Randy please review about nosebleeds below/-aa

## 2016-11-16 NOTE — Telephone Encounter (Signed)
Do understand patient probably worried about nosebleed and headaches. If patient's headache has resolved and he currently has no new symptoms like weakness, vision change or new changes in his speech, I think this can wait until Dr. Rosanna Randy gets back. Patient is on aspirin for secondary prevention of stroke and I'm hesitant to change anything regarding this without ever having seen patient. Thank you.

## 2016-11-18 NOTE — Telephone Encounter (Signed)
Stop asa for 1 week then try 81 mg every other dayt.

## 2016-11-20 NOTE — Telephone Encounter (Signed)
Advised  ED 

## 2017-01-03 ENCOUNTER — Other Ambulatory Visit: Payer: Self-pay | Admitting: Family Medicine

## 2017-01-28 ENCOUNTER — Telehealth: Payer: Self-pay | Admitting: Family Medicine

## 2017-01-28 NOTE — Telephone Encounter (Signed)
There is nothing in a message. Did you mean to put a message?-aa

## 2017-02-05 DIAGNOSIS — H4720 Unspecified optic atrophy: Secondary | ICD-10-CM | POA: Diagnosis not present

## 2017-02-08 ENCOUNTER — Ambulatory Visit (INDEPENDENT_AMBULATORY_CARE_PROVIDER_SITE_OTHER): Payer: PPO | Admitting: Family Medicine

## 2017-02-08 ENCOUNTER — Encounter: Payer: Self-pay | Admitting: Family Medicine

## 2017-02-08 VITALS — BP 120/84 | HR 74 | Temp 97.9°F | Resp 16 | Wt 169.0 lb

## 2017-02-08 DIAGNOSIS — F329 Major depressive disorder, single episode, unspecified: Secondary | ICD-10-CM | POA: Diagnosis not present

## 2017-02-08 DIAGNOSIS — I69920 Aphasia following unspecified cerebrovascular disease: Secondary | ICD-10-CM

## 2017-02-08 DIAGNOSIS — F32A Depression, unspecified: Secondary | ICD-10-CM

## 2017-02-08 DIAGNOSIS — E7800 Pure hypercholesterolemia, unspecified: Secondary | ICD-10-CM | POA: Diagnosis not present

## 2017-02-08 NOTE — Progress Notes (Signed)
Subjective:  HPI Pt is here for a follow up of his chronic problems. He says that he is feeling well and is feeling well emotionally. He has no complaints today.    Lipid/Cholesterol, Follow-up:   Last seen for this 6 months ago.  Management changes since that visit include none. . Last Lipid Panel:    Component Value Date/Time   CHOL 135 07/24/2016 0810   TRIG 45 07/24/2016 0810   HDL 62 07/24/2016 0810   LDLCALC 64 07/24/2016 0810    Risk factors for vascular disease include hypercholesterolemia and hypertension  He reports good compliance with treatment. He is not having side effects.  Current exercise: about 3 days a week.  Wt Readings from Last 3 Encounters:  02/08/17 169 lb (76.7 kg)  07/23/16 170 lb (77.1 kg)  01/16/16 181 lb (82.1 kg)    -------------------------------------------------------------------   Prior to Admission medications   Medication Sig Start Date End Date Taking? Authorizing Provider  aspirin 325 MG tablet Take 1 tablet by mouth every other day.    Yes Historical Provider, MD  atorvastatin (LIPITOR) 80 MG tablet Take 1 tablet (80 mg total) by mouth at bedtime. 01/16/16  Yes Jahari Billy Maceo Pro., MD  montelukast (SINGULAIR) 10 MG tablet TAKE 1 TABLET (10 MG TOTAL) BY MOUTH AT BEDTIME. 11/11/16  Yes Jerrol Banana., MD  naproxen (NAPROSYN) 500 MG tablet TAKE 1 TABLET (500 MG TOTAL) BY MOUTH 2 (TWO) TIMES DAILY WITH A MEAL. 04/14/16  Yes Laurene Melendrez Maceo Pro., MD  venlafaxine (EFFEXOR) 75 MG tablet Take 1 tablet (75 mg total) by mouth 2 (two) times daily. 01/16/16  Yes Woodrow Dulski Maceo Pro., MD    Patient Active Problem List   Diagnosis Date Noted  . Depression 01/16/2016  . Cephalalgia 06/03/2015  . Problems influencing health status 06/03/2015  . 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 Angiopathy 06/03/2015  . Completed stroke ([place]) 06/03/2015  . Hypercholesterolemia 06/03/2015    Past Medical  History:  Diagnosis Date  . Acute intractable headache    unspecified headache type  . Allergy   . Aphasia S/P CVA   . Clot    blood clot  . Completed stroke ([place])   . Hypercholesterolemia     Social History   Social History  . Marital status: Legally Separated    Spouse name: N/A  . Number of children: N/A  . Years of education: N/A   Occupational History  . Not on file.   Social History Main Topics  . Smoking status: Never Smoker  . Smokeless tobacco: Never Used  . Alcohol use No  . Drug use: No  . Sexual activity: No   Other Topics Concern  . Not on file   Social History Narrative  . No narrative on file    No Known Allergies  Review of Systems  Constitutional: Negative.   HENT: Negative.   Eyes: Negative.   Respiratory: Negative.   Cardiovascular: Negative.   Gastrointestinal: Negative.   Genitourinary: Negative.   Musculoskeletal: Negative.   Skin: Negative.   Neurological: Negative.   Endo/Heme/Allergies: Negative.   Psychiatric/Behavioral: Negative.     Immunization History  Administered Date(s) Administered  . Influenza,inj,Quad PF,36+ Mos 07/15/2015, 07/23/2016  . Td 03/31/2005    Objective:  BP 120/84 (BP Location: Left Arm, Patient Position: Sitting, Cuff Size: Normal)   Pulse 74   Temp 97.9 F (36.6 C) (Oral)   Resp 16  Wt 169 lb (76.7 kg)   SpO2 98%   BMI 24.96 kg/m   Physical Exam  Constitutional: He is oriented to person, place, and time and well-developed, well-nourished, and in no distress.  Eyes: Conjunctivae and EOM are normal. Pupils are equal, round, and reactive to light.  Neck: Normal range of motion. Neck supple.  Cardiovascular: Normal rate, regular rhythm, normal heart sounds and intact distal pulses.   Pulmonary/Chest: Effort normal and breath sounds normal.  Musculoskeletal: Normal range of motion.  Neurological: He is alert and oriented to person, place, and time. He has normal reflexes. Gait normal. GCS score  is 15.  Skin: Skin is warm and dry.  Psychiatric: Mood, memory, affect and judgment normal.    Lab Results  Component Value Date   WBC 5.6 07/24/2016   HGB 16.0 01/16/2015   HCT 43.3 07/24/2016   PLT 214 07/24/2016   GLUCOSE 87 07/24/2016   CHOL 135 07/24/2016   TRIG 45 07/24/2016   HDL 62 07/24/2016   LDLCALC 64 07/24/2016   TSH 0.736 07/24/2016   PSA 1.2 01/16/2015    CMP     Component Value Date/Time   NA 143 07/24/2016 0810   K 5.0 07/24/2016 0810   CL 104 07/24/2016 0810   CO2 26 07/24/2016 0810   GLUCOSE 87 07/24/2016 0810   BUN 19 07/24/2016 0810   CREATININE 0.94 07/24/2016 0810   CALCIUM 9.3 07/24/2016 0810   PROT 6.4 07/24/2016 0810   ALBUMIN 3.9 07/24/2016 0810   AST 28 07/24/2016 0810   ALT 31 07/24/2016 0810   ALKPHOS 80 07/24/2016 0810   BILITOT 1.0 07/24/2016 0810   GFRNONAA 90 07/24/2016 0810   GFRAA 104 07/24/2016 0810    Assessment and Plan :  1. Depression, unspecified depression type Stable.   2. Hypercholesterolemia Stable.   3. 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   HPI, Exam, and A&P Transcribed under the direction and in the presence of Gayland Nicol L. Cranford Mon, MD  Electronically Signed: Katina Dung, [place] MD [facility] 02/08/2017 10:26 AM

## 2017-04-08 ENCOUNTER — Other Ambulatory Visit: Payer: Self-pay | Admitting: Family Medicine

## 2017-04-23 ENCOUNTER — Other Ambulatory Visit: Payer: Self-pay | Admitting: Family Medicine

## 2017-05-11 ENCOUNTER — Telehealth: Payer: Self-pay | Admitting: Family Medicine

## 2017-06-29 DIAGNOSIS — R2990 Unspecified symptoms and signs involving the nervous system: Secondary | ICD-10-CM | POA: Diagnosis not present

## 2017-06-29 DIAGNOSIS — I69920 Aphasia following unspecified cerebrovascular disease: Secondary | ICD-10-CM | POA: Diagnosis not present

## 2017-07-09 ENCOUNTER — Other Ambulatory Visit: Payer: Self-pay | Admitting: Family Medicine

## 2017-07-13 ENCOUNTER — Ambulatory Visit (INDEPENDENT_AMBULATORY_CARE_PROVIDER_SITE_OTHER): Payer: PPO

## 2017-07-13 VITALS — BP 124/84 | HR 72 | Temp 98.9°F | Ht 69.0 in | Wt 164.8 lb

## 2017-07-13 DIAGNOSIS — Z0000 Encounter for general adult medical examination without abnormal findings: Secondary | ICD-10-CM | POA: Diagnosis not present

## 2017-07-13 DIAGNOSIS — Z23 Encounter for immunization: Secondary | ICD-10-CM | POA: Diagnosis not present

## 2017-07-13 NOTE — Patient Instructions (Signed)
Johnathan Campbell , Thank you for taking time to come for your Medicare Wellness Visit. I appreciate your ongoing commitment to your health goals. Please review the following plan we discussed and let me know if I can assist you in the future.   Screening recommendations/referrals: Colonoscopy: up to date Recommended yearly ophthalmology/optometry visit for glaucoma screening and checkup Recommended yearly dental visit for hygiene and checkup  Vaccinations: Influenza vaccine: completed today Pneumococcal vaccine: N/A Tdap vaccine: declined Shingles vaccine: N/A  Advanced directives: Advance directive discussed with you today. I have provided a copy for you to complete at home and have notarized. Once this is complete please bring a copy in to our office so we can scan it into your chart.  Conditions/risks identified: Continue drinking 6-8 glasses of water a day.  Next appointment: 08/10/17 @ 9:30 AM  Preventive Care 40-64 Years, Male Preventive care refers to lifestyle choices and visits with your health care provider that can promote health and wellness. What does preventive care include?  A yearly physical exam. This is also called an annual well check.  Dental exams once or twice a year.  Routine eye exams. Ask your health care provider how often you should have your eyes checked.  Personal lifestyle choices, including:  Daily care of your teeth and gums.  Regular physical activity.  Eating a healthy diet.  Avoiding tobacco and drug use.  Limiting alcohol use.  Practicing safe sex.  Taking low-dose aspirin every day starting at age 21. What happens during an annual well check? The services and screenings done by your health care provider during your annual well check will depend on your age, overall health, lifestyle risk factors, and family history of disease. Counseling  Your health care provider may ask you questions about your:  Alcohol use.  Tobacco use.  Drug  use.  Emotional well-being.  Home and relationship well-being.  Sexual activity.  Eating habits.  Work and work Statistician. Screening  You may have the following tests or measurements:  Height, weight, and BMI.  Blood pressure.  Lipid and cholesterol levels. These may be checked every 5 years, or more frequently if you are over 52 years old.  Skin check.  Lung cancer screening. You may have this screening every year starting at age 28 if you have a 30-pack-year history of smoking and currently smoke or have quit within the past 15 years.  Fecal occult blood test (FOBT) of the stool. You may have this test every year starting at age 23.  Flexible sigmoidoscopy or colonoscopy. You may have a sigmoidoscopy every 5 years or a colonoscopy every 10 years starting at age 51.  Prostate cancer screening. Recommendations will vary depending on your family history and other risks.  Hepatitis C blood test.  Hepatitis B blood test.  Sexually transmitted disease (STD) testing.  Diabetes screening. This is done by checking your blood sugar (glucose) after you have not eaten for a while (fasting). You may have this done every 1-3 years. Discuss your test results, treatment options, and if necessary, the need for more tests with your health care provider. Vaccines  Your health care provider may recommend certain vaccines, such as:  Influenza vaccine. This is recommended every year.  Tetanus, diphtheria, and acellular pertussis (Tdap, Td) vaccine. You may need a Td booster every 10 years.  Zoster vaccine. You may need this after age 54.  Pneumococcal 13-valent conjugate (PCV13) vaccine. You may need this if you have certain conditions and have  not been vaccinated.  Pneumococcal polysaccharide (PPSV23) vaccine. You may need one or two doses if you smoke cigarettes or if you have certain conditions. Talk to your health care provider about which screenings and vaccines you need and how  often you need them. This information is not intended to replace advice given to you by your health care provider. Make sure you discuss any questions you have with your health care provider. Document Released: 11/15/2015 Document Revised: 07/08/2016 Document Reviewed: 08/20/2015 Elsevier Interactive Patient Education  2017 [employer] Prevention in the Home Falls can cause injuries. They can happen to people of all ages. There are many things you can do to make your home safe and to help prevent falls. What can I do on the outside of my home?  Regularly fix the edges of walkways and driveways and fix any cracks.  Remove anything that might make you trip as you walk through a door, such as a raised step or threshold.  Trim any bushes or trees on the path to your home.  Use bright outdoor lighting.  Clear any walking paths of anything that might make someone trip, such as rocks or tools.  Regularly check to see if handrails are loose or broken. Make sure that both sides of any steps have handrails.  Any raised decks and porches should have guardrails on the edges.  Have any leaves, snow, or ice cleared regularly.  Use sand or salt on walking paths during winter.  Clean up any spills in your garage right away. This includes oil or grease spills. What can I do in the bathroom?  Use night lights.  Install grab bars by the toilet and in the tub and shower. Do not use towel bars as grab bars.  Use non-skid mats or decals in the tub or shower.  If you need to sit down in the shower, use a plastic, non-slip stool.  Keep the floor dry. Clean up any water that spills on the floor as soon as it happens.  Remove soap buildup in the tub or shower regularly.  Attach bath mats securely with double-sided non-slip rug tape.  Do not have throw rugs and other things on the floor that can make you trip. What can I do in the bedroom?  Use night lights.  Make sure that you have a  light by your bed that is easy to reach.  Do not use any sheets or blankets that are too big for your bed. They should not hang down onto the floor.  Have a firm chair that has side arms. You can use this for support while you get dressed.  Do not have throw rugs and other things on the floor that can make you trip. What can I do in the kitchen?  Clean up any spills right away.  Avoid walking on wet floors.  Keep items that you use a lot in easy-to-reach places.  If you need to reach something above you, use a strong step stool that has a grab bar.  Keep electrical cords out of the way.  Do not use floor polish or wax that makes floors slippery. If you must use wax, use non-skid floor wax.  Do not have throw rugs and other things on the floor that can make you trip. What can I do with my stairs?  Do not leave any items on the stairs.  Make sure that there are handrails on both sides of the stairs and use  them. Fix handrails that are broken or loose. Make sure that handrails are as long as the stairways.  Check any carpeting to make sure that it is firmly attached to the stairs. Fix any carpet that is loose or worn.  Avoid having throw rugs at the top or bottom of the stairs. If you do have throw rugs, attach them to the floor with carpet tape.  Make sure that you have a light switch at the top of the stairs and the bottom of the stairs. If you do not have them, ask someone to add them for you. What else can I do to help prevent falls?  Wear shoes that:  Do not have high heels.  Have rubber bottoms.  Are comfortable and fit you well.  Are closed at the toe. Do not wear sandals.  If you use a stepladder:  Make sure that it is fully opened. Do not climb a closed stepladder.  Make sure that both sides of the stepladder are locked into place.  Ask someone to hold it for you, if possible.  Clearly [place] and make sure that you can see:  Any grab bars or  handrails.  First and last steps.  Where the edge of each step is.  Use tools that help you move around (mobility aids) if they are needed. These include:  Canes.  Walkers.  Scooters.  Crutches.  Turn on the lights when you go into a dark area. Replace any light bulbs as soon as they burn out.  Set up your furniture so you have a clear path. Avoid moving your furniture around.  If any of your floors are uneven, fix them.  If there are any pets around you, be aware of where they are.  Review your medicines with your doctor. Some medicines can make you feel dizzy. This can increase your chance of falling. Ask your doctor what other things that you can do to help prevent falls. This information is not intended to replace advice given to you by your health care provider. Make sure you discuss any questions you have with your health care provider. Document Released: 08/15/2009 Document Revised: 03/26/2016 Document Reviewed: 11/23/2014 Elsevier Interactive Patient Education  2017 [employer].

## 2017-07-13 NOTE — Progress Notes (Signed)
Subjective:   Johnathan Campbell is a 56 y.o. male who presents for Medicare Annual/Subsequent preventive examination.  Review of Systems:  N/A  Cardiac Risk Factors include: advanced age (>65men, >32 women);dyslipidemia;male gender;Other (see comment), Risk factor comments: post stroke     Objective:    Vitals: BP 124/84 (BP Location: Left Arm)   Pulse 72   Temp 98.9 F (37.2 C) (Oral)   Ht 5\' 9"  (1.753 m)   Wt 164 lb 12.8 oz (74.8 kg)   BMI 24.34 kg/m   Body mass index is 24.34 kg/m.  Tobacco History  Smoking Status  . Never Smoker  Smokeless Tobacco  . Never Used     Counseling given: Not Answered   Past Medical History:  Diagnosis Date  . Acute intractable headache    unspecified headache type  . Allergy   . Aphasia S/P CVA   . Clot    blood clot  . Completed stroke ([place])   . Hypercholesterolemia    Past Surgical History:  Procedure Laterality Date  . HERNIA REPAIR  twice   Family History  Problem Relation Age of Onset  . Hypertension Mother   . Hyperlipidemia Mother   . Hypertension Father   . Hyperlipidemia Father    History  Sexual Activity  . Sexual activity: No    Outpatient Encounter Prescriptions as of 07/13/2017  Medication Sig  . aspirin 325 MG tablet Take 1 tablet by mouth every other day.   Marland Kitchen atorvastatin (LIPITOR) 80 MG tablet Take 1 tablet (80 mg total) by mouth at bedtime.  . montelukast (SINGULAIR) 10 MG tablet TAKE 1 TABLET (10 MG TOTAL) BY MOUTH AT BEDTIME.  . naproxen (NAPROSYN) 500 MG tablet TAKE 1 TABLET (500 MG TOTAL) BY MOUTH 2 (TWO) TIMES DAILY WITH A MEAL. (Patient taking differently: TAKE 1 TABLET (500 MG TOTAL) BY MOUTH 2 (TWO) TIMES DAILY WITH A MEAL. as needed)  . venlafaxine (EFFEXOR) 75 MG tablet TAKE 1 TABLET BY MOUTH TWICE A DAY   No facility-administered encounter medications on file as of 07/13/2017.     Activities of Daily Living In your present state of health, do you have any difficulty performing the  following activities: 07/13/2017 07/23/2016  Hearing? Y N  Comment post stroke symptoms -  Vision? Y N  Comment left eye- post stroke -  Difficulty concentrating or making decisions? N N  Walking or climbing stairs? N N  Dressing or bathing? N N  Doing errands, shopping? N N  Preparing Food and eating ? N -  Using the Toilet? N -  In the past six months, have you accidently leaked urine? N -  Do you have problems with loss of bowel control? N -  Managing your Medications? N -  Managing your Finances? N -  Housekeeping or managing your Housekeeping? N -  Some recent data might be hidden    Patient Care Team: Jerrol Banana., MD as PCP - General (Family Medicine) Vladimir Crofts, MD as Consulting Physician (Neurology) Dingeldein, Remo Lipps, MD as Consulting Physician (Ophthalmology)   Assessment:     Exercise Activities and Dietary recommendations Current Exercise Habits: Structured exercise class (Pt goes to the gym on and off, nothing structured. Unable to advise how much or how often), Type of exercise: walking;treadmill;strength training/weights, Intensity: Moderate, Exercise limited by: Other - see comments (CVA)  Goals    . Increase water intake          Continue drinking 6-8  glasses of water a day.      Fall Risk Fall Risk  07/13/2017 07/23/2016 11/18/2015 07/15/2015  Falls in the past year? No No No No   Depression Screen PHQ 2/9 Scores 07/13/2017 07/23/2016 11/18/2015 07/15/2015  PHQ - 2 Score 0 0 3 0  PHQ- 9 Score - - 9 -    Cognitive Function     6CIT Screen 07/13/2017  What Year? 0 points  What month? 3 points  What time? 0 points  Count back from 20 0 points  Months in reverse 4 points  Repeat phrase 2 points  Total Score 9    Immunization History  Administered Date(s) Administered  . Influenza,inj,Quad PF,6+ Mos 07/15/2015, 07/23/2016, 07/13/2017  . Td 03/31/2005   Screening Tests Health Maintenance  Topic Date Due  . Hepatitis C Screening   Feb 09, 1961  . HIV Screening  01/31/1975  . TETANUS/TDAP  04/01/2015  . COLONOSCOPY  02/14/2019  . INFLUENZA VACCINE  Completed      Plan:  I have personally reviewed and addressed the Medicare Annual Wellness questionnaire and have noted the following in the patient's chart:  A. Medical and social history B. Use of alcohol, tobacco or illicit drugs  C. Current medications and supplements D. Functional ability and status E.  Nutritional status F.  Physical activity G. Advance directives H. List of other physicians I.  Hospitalizations, surgeries, and ER visits in previous 12 months J.  [place] such as hearing and vision if needed, cognitive and depression L. Referrals and appointments - none  In addition, I have reviewed and discussed with patient certain preventive protocols, quality metrics, and best practice recommendations. A written personalized care plan for preventive services as well as general preventive health recommendations were provided to patient.  See attached scanned questionnaire for additional information.   Signed,  Fabio Neighbors, LPN Nurse Health Advisor   MD Recommendations: Pt declined Hepatitis C screening, HIV screening and tetanus vaccine today.

## 2017-07-20 DIAGNOSIS — R2990 Unspecified symptoms and signs involving the nervous system: Secondary | ICD-10-CM | POA: Diagnosis not present

## 2017-07-20 DIAGNOSIS — I69920 Aphasia following unspecified cerebrovascular disease: Secondary | ICD-10-CM | POA: Diagnosis not present

## 2017-08-10 ENCOUNTER — Ambulatory Visit: Payer: PPO | Admitting: Family Medicine

## 2017-08-10 ENCOUNTER — Ambulatory Visit (INDEPENDENT_AMBULATORY_CARE_PROVIDER_SITE_OTHER): Payer: PPO | Admitting: Family Medicine

## 2017-08-10 VITALS — BP 116/78 | HR 70 | Temp 97.9°F | Resp 16 | Ht 69.0 in | Wt 164.0 lb

## 2017-08-10 DIAGNOSIS — Z0000 Encounter for general adult medical examination without abnormal findings: Secondary | ICD-10-CM | POA: Diagnosis not present

## 2017-08-10 DIAGNOSIS — Z1159 Encounter for screening for other viral diseases: Secondary | ICD-10-CM

## 2017-08-10 DIAGNOSIS — K645 Perianal venous thrombosis: Secondary | ICD-10-CM

## 2017-08-10 DIAGNOSIS — Z125 Encounter for screening for malignant neoplasm of prostate: Secondary | ICD-10-CM

## 2017-08-10 MED ORDER — HYDROCORTISONE 2.5 % RE CREA: 1.0000 "application " | TOPICAL_CREAM | Freq: Two times a day (BID) | RECTAL | 0 refills | Status: DC

## 2017-08-10 NOTE — Progress Notes (Signed)
Patient: Johnathan Campbell, Male    DOB: [birth year], 56 y.o.   MRN: [id number] Visit Date: 08/10/2017  Today's Provider: Wilhemena Durie, MD   Chief Complaint  Patient presents with  . Annual Exam   Subjective:  Johnathan Campbell is a 56 y.o. male who presents today for health maintenance and complete physical. He feels well. He reports exercising daily. He reports he is sleeping poorly.  Immunization History  Administered Date(s) Administered  . Influenza,inj,Quad PF,6+ Mos 07/15/2015, 07/23/2016, 07/13/2017  . Td 03/31/2005   02/13/14 Colonoscopy, [facility]-Diverticulosis, repeat 5 years.  Patient needs Hep C screening  Review of Systems  Constitutional: Negative.   HENT: Negative.   Eyes: Positive for visual disturbance.       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  Respiratory: Negative.   Cardiovascular: Negative.   Gastrointestinal: Positive for rectal pain.  Endocrine: Negative.   Genitourinary: Negative.   Musculoskeletal: Negative.   Skin: Negative.   Allergic/Immunologic: Negative.   Neurological: Positive for speech difficulty (Aphasia secondary to stroke.).  Hematological: Negative.   Psychiatric/Behavioral: Negative.     Social History   Social History  . Marital status: Legally Separated    Spouse name: N/A  . Number of children: N/A  . Years of education: N/A   Occupational History  . Not on file.   Social History Main Topics  . Smoking status: Never Smoker  . Smokeless tobacco: Never Used  . Alcohol use Yes     Comment: socially  . Drug use: No  . Sexual activity: No   Other Topics Concern  . Not on file   Social History Narrative  . No narrative on file    Patient Active Problem List   Diagnosis Date Noted  . Depression 01/16/2016  . Cephalalgia 06/03/2015  . Problems influencing health status 06/03/2015  . 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 Angiopathy 06/03/2015  . Completed stroke ([place])  06/03/2015  . Hypercholesterolemia 06/03/2015    Past Surgical History:  Procedure Laterality Date  . HERNIA REPAIR  twice    His family history includes Hyperlipidemia in his father and mother; Hypertension in his father and mother.     Outpatient Encounter Prescriptions as of 08/10/2017  Medication Sig Note  . aspirin 325 MG tablet Take 1 tablet by mouth every other day.  06/03/2015: Received from: [place]: Take by mouth.  Marland Kitchen atorvastatin (LIPITOR) 80 MG tablet Take 1 tablet (80 mg total) by mouth at bedtime.   . montelukast (SINGULAIR) 10 MG tablet TAKE 1 TABLET (10 MG TOTAL) BY MOUTH AT BEDTIME.   . naproxen (NAPROSYN) 500 MG tablet TAKE 1 TABLET (500 MG TOTAL) BY MOUTH 2 (TWO) TIMES DAILY WITH A MEAL. (Patient taking differently: TAKE 1 TABLET (500 MG TOTAL) BY MOUTH 2 (TWO) TIMES DAILY WITH A MEAL. as needed)   . venlafaxine (EFFEXOR) 75 MG tablet TAKE 1 TABLET BY MOUTH TWICE A DAY    No facility-administered encounter medications on file as of 08/10/2017.     Patient Care Team: Jerrol Banana., MD as PCP - General (Family Medicine) Vladimir Crofts, MD as Consulting Physician (Neurology) Dingeldein, Remo Lipps, MD as Consulting Physician (Ophthalmology)      Objective:   Vitals:  Vitals:   08/10/17 0957  BP: 116/78  Pulse: 70  Resp: 16  Temp: 97.9 F (36.6 C)  TempSrc: Oral  SpO2: 99%  Weight: 164 lb (74.4  kg)  Height: 5\' 9"  (1.753 m)    Physical Exam  Constitutional: He is oriented to person, place, and time. He appears well-developed and well-nourished.  HENT:  Head: Normocephalic and atraumatic.  Right Ear: External ear normal.  Nose: Nose normal.  Mouth/Throat: Oropharynx is clear and moist.  Eyes: Pupils are equal, round, and reactive to light. Conjunctivae and EOM are normal.  Neck: Normal range of motion. Neck supple.  Cardiovascular: Normal rate, regular rhythm, normal heart sounds and intact distal pulses.    Pulmonary/Chest: Effort normal and breath sounds normal.  Abdominal: Soft. Bowel sounds are normal.  Genitourinary: Rectum normal, prostate normal and penis normal.  Genitourinary Comments: Slightly thrombosed external hemorrhoid.  Musculoskeletal: Normal range of motion.  Neurological: He is alert and oriented to person, place, and time. He has normal reflexes.  Skin: Skin is warm and dry.  Psychiatric: He has a normal mood and affect. His behavior is normal. Judgment and thought content normal.   Fall Risk  08/10/2017 07/13/2017 07/23/2016 11/18/2015 07/15/2015  Falls in the past year? No No No No No     Depression Screen PHQ 2/9 Scores 08/10/2017 07/13/2017 07/23/2016 11/18/2015  PHQ - 2 Score 2 0 0 3  PHQ- 9 Score 6 - - 9   Current Exercise Habits: Home exercise routine, Frequency (Times/Week): 7   Functional Status Survey: Is the patient deaf or have difficulty hearing?: No Does the patient have difficulty seeing, even when wearing glasses/contacts?: No Does the patient have difficulty concentrating, remembering, or making decisions?: No Does the patient have difficulty walking or climbing stairs?: No Does the patient have difficulty dressing or bathing?: No Does the patient have difficulty doing errands alone such as visiting a doctor's office or shopping?: No    Assessment & Plan:     Routine Health Maintenance and Physical Exam  Exercise Activities and Dietary recommendations Goals    . Increase water intake          Continue drinking 6-8 glasses of water a day.       Immunization History  Administered Date(s) Administered  . Influenza,inj,Quad PF,6+ Mos 07/15/2015, 07/23/2016, 07/13/2017  . Td 03/31/2005    Health Maintenance  Topic Date Due  . Hepatitis C Screening  11/01/1961  . HIV Screening  01/31/1975  . TETANUS/TDAP  04/01/2015  . COLONOSCOPY  02/14/2019  . INFLUENZA VACCINE  Completed     Discussed health benefits of physical activity, and  encouraged him to engage in regular exercise appropriate for his age and condition.  S/p CVA External Hemorrhoid  I have done the exam and reviewed the chart and it is accurate to the best of my knowledge. Development worker, community has been used and  any errors in dictation or transcription are unintentional. Miguel Aschoff M.D. [facility]

## 2017-08-10 NOTE — Patient Instructions (Signed)
Hot compress to hemorrhoid three times daily  Prescription at pharmacy  Follow up appointment in 4 months

## 2017-08-11 DIAGNOSIS — Z1159 Encounter for screening for other viral diseases: Secondary | ICD-10-CM | POA: Diagnosis not present

## 2017-08-11 DIAGNOSIS — Z125 Encounter for screening for malignant neoplasm of prostate: Secondary | ICD-10-CM | POA: Diagnosis not present

## 2017-08-11 DIAGNOSIS — Z0000 Encounter for general adult medical examination without abnormal findings: Secondary | ICD-10-CM | POA: Diagnosis not present

## 2017-08-12 LAB — LIPID PANEL
CHOL/HDL RATIO: 2.1 (calc) (ref <5.0)
CHOLESTEROL: 149 mg/dL (ref <200)
HDL: 72 mg/dL (ref >40)
LDL CHOLESTEROL (CALC): 63 mg/dL
Non-HDL Cholesterol (Calc): 77 mg/dL (calc) (ref <130)
TRIGLYCERIDES: 48 mg/dL (ref <150)

## 2017-08-12 LAB — COMPREHENSIVE METABOLIC PANEL
AG RATIO: 1.6 (calc) (ref 1.0–2.5)
ALKALINE PHOSPHATASE (APISO): 70 U/L (ref 40–115)
ALT: 29 U/L (ref 9–46)
AST: 20 U/L (ref 10–35)
Albumin: 4.1 g/dL (ref 3.6–5.1)
BUN: 19 mg/dL (ref 7–25)
CO2: 32 mmol/L (ref 20–32)
Calcium: 9.5 mg/dL (ref 8.6–10.3)
Chloride: 105 mmol/L (ref 98–110)
Creat: 0.88 mg/dL (ref 0.70–1.33)
Globulin: 2.5 g/dL (calc) (ref 1.9–3.7)
Glucose, Bld: 88 mg/dL (ref 65–99)
POTASSIUM: 4.8 mmol/L (ref 3.5–5.3)
Sodium: 141 mmol/L (ref 135–146)
TOTAL PROTEIN: 6.6 g/dL (ref 6.1–8.1)
Total Bilirubin: 1.5 mg/dL — ABNORMAL HIGH (ref 0.2–1.2)

## 2017-08-12 LAB — CBC WITH DIFFERENTIAL/PLATELET
BASOS ABS: 31 {cells}/uL (ref 0–200)
Basophils Relative: 0.6 %
EOS ABS: 62 {cells}/uL (ref 15–500)
EOS PCT: 1.2 %
HEMATOCRIT: 44.1 % (ref 38.5–50.0)
HEMOGLOBIN: 14.9 g/dL (ref 13.2–17.1)
LYMPHS ABS: 1581 {cells}/uL (ref 850–3900)
MCH: 31.5 pg (ref 27.0–33.0)
MCHC: 33.8 g/dL (ref 32.0–36.0)
MCV: 93.2 fL (ref 80.0–100.0)
MPV: 11.6 fL (ref 7.5–12.5)
Monocytes Relative: 9.7 %
NEUTROS ABS: 3021 {cells}/uL (ref 1500–7800)
NEUTROS PCT: 58.1 %
Platelets: 214 10*3/uL (ref 140–400)
RBC: 4.73 10*6/uL (ref 4.20–5.80)
RDW: 12.4 % (ref 11.0–15.0)
Total Lymphocyte: 30.4 %
WBC: 5.2 10*3/uL (ref 3.8–10.8)
WBCMIX: 504 {cells}/uL (ref 200–950)

## 2017-08-12 LAB — HEPATITIS C ANTIBODY
Hepatitis C Ab: NONREACTIVE
SIGNAL TO CUT-OFF: 0.01 (ref <1.00)

## 2017-08-12 LAB — TSH: TSH: 0.78 mIU/L (ref 0.40–4.50)

## 2017-08-12 LAB — PSA: PSA: 1.3 ng/mL (ref <=4.0)

## 2017-08-22 DIAGNOSIS — L03012 Cellulitis of left finger: Secondary | ICD-10-CM | POA: Diagnosis not present

## 2017-08-22 DIAGNOSIS — Z23 Encounter for immunization: Secondary | ICD-10-CM | POA: Diagnosis not present

## 2017-09-29 DIAGNOSIS — R079 Chest pain, unspecified: Secondary | ICD-10-CM | POA: Diagnosis not present

## 2017-09-29 DIAGNOSIS — R002 Palpitations: Secondary | ICD-10-CM | POA: Diagnosis not present

## 2017-09-29 DIAGNOSIS — Z7689 Persons encountering health services in other specified circumstances: Secondary | ICD-10-CM | POA: Diagnosis not present

## 2017-10-04 DIAGNOSIS — R002 Palpitations: Secondary | ICD-10-CM | POA: Diagnosis not present

## 2017-10-06 DIAGNOSIS — R002 Palpitations: Secondary | ICD-10-CM | POA: Diagnosis not present

## 2017-10-20 NOTE — Telephone Encounter (Signed)
Visit completed.

## 2017-10-22 DIAGNOSIS — E782 Mixed hyperlipidemia: Secondary | ICD-10-CM | POA: Diagnosis not present

## 2017-10-22 DIAGNOSIS — R002 Palpitations: Secondary | ICD-10-CM | POA: Diagnosis not present

## 2017-10-22 DIAGNOSIS — I69320 Aphasia following cerebral infarction: Secondary | ICD-10-CM | POA: Diagnosis not present

## 2017-11-19 DIAGNOSIS — R040 Epistaxis: Secondary | ICD-10-CM | POA: Diagnosis not present

## 2017-11-19 DIAGNOSIS — I69320 Aphasia following cerebral infarction: Secondary | ICD-10-CM | POA: Diagnosis not present

## 2017-11-19 DIAGNOSIS — E782 Mixed hyperlipidemia: Secondary | ICD-10-CM | POA: Diagnosis not present

## 2017-12-07 ENCOUNTER — Ambulatory Visit (INDEPENDENT_AMBULATORY_CARE_PROVIDER_SITE_OTHER): Payer: PPO | Admitting: Family Medicine

## 2017-12-07 ENCOUNTER — Encounter: Payer: Self-pay | Admitting: Family Medicine

## 2017-12-07 VITALS — BP 118/82 | HR 62 | Temp 97.6°F | Resp 16 | Wt 172.0 lb

## 2017-12-07 DIAGNOSIS — F32A Depression, unspecified: Secondary | ICD-10-CM

## 2017-12-07 DIAGNOSIS — E7800 Pure hypercholesterolemia, unspecified: Secondary | ICD-10-CM | POA: Diagnosis not present

## 2017-12-07 DIAGNOSIS — F329 Major depressive disorder, single episode, unspecified: Secondary | ICD-10-CM

## 2017-12-07 DIAGNOSIS — I639 Cerebral infarction, unspecified: Secondary | ICD-10-CM | POA: Diagnosis not present

## 2017-12-07 NOTE — Progress Notes (Signed)
Patient: Johnathan Campbell Male    DOB: 1961-08-05   57 y.o.   MRN: [id number] Visit Date: 12/07/2017  Today's Provider: Wilhemena Durie, MD   Chief Complaint  Patient presents with  . Depression   Subjective:    HPI Pt is here today for a 6 month follow up of depression he reports that he has been feeling ok. He has had some episodes of his with heart where he feels like he "gets excited". He has been seeing cardiologist for this and has worn a heart monitor. Emotionally he says he is doing ok. PHQ 9 score today is a 6. He is doing ok on the Venlafaxine.       No Known Allergies   Current Outpatient Medications:  .  aspirin 325 MG tablet, Take 1 tablet by mouth every other day. , Disp: , Rfl:  .  atorvastatin (LIPITOR) 80 MG tablet, Take 1 tablet (80 mg total) by mouth at bedtime., Disp: 90 tablet, Rfl: 3 .  hydrocortisone (PROCTOZONE-HC) 2.5 % rectal cream, Place 1 application rectally 2 (two) times daily., Disp: 30 g, Rfl: 0 .  montelukast (SINGULAIR) 10 MG tablet, TAKE 1 TABLET (10 MG TOTAL) BY MOUTH AT BEDTIME., Disp: 90 tablet, Rfl: 3 .  naproxen (NAPROSYN) 500 MG tablet, TAKE 1 TABLET (500 MG TOTAL) BY MOUTH 2 (TWO) TIMES DAILY WITH A MEAL. (Patient taking differently: TAKE 1 TABLET (500 MG TOTAL) BY MOUTH 2 (TWO) TIMES DAILY WITH A MEAL. as needed), Disp: 60 tablet, Rfl: 11 .  venlafaxine (EFFEXOR) 75 MG tablet, TAKE 1 TABLET BY MOUTH TWICE A DAY, Disp: 180 tablet, Rfl: 3  Review of Systems  Constitutional: Negative.   HENT: Negative.   Eyes: Negative.   Respiratory: Negative.   Cardiovascular: Negative.   Gastrointestinal: Negative.   Endocrine: Negative.   Genitourinary: Negative.   Musculoskeletal: Negative.   Skin: Negative.   Allergic/Immunologic: Negative.   Neurological: Negative.   Hematological: Negative.   Psychiatric/Behavioral: Negative.     Social History   Tobacco Use  . Smoking status: Never Smoker  . Smokeless tobacco: Never Used    Substance Use Topics  . Alcohol use: Yes    Comment: socially   Objective:   BP 118/82 (BP Location: Left Arm, Patient Position: Sitting, Cuff Size: Normal)   Pulse 62   Temp 97.6 F (36.4 C) (Oral)   Resp 16   Wt 172 lb (78 kg)   BMI 25.40 kg/m  Vitals:   12/07/17 1110  BP: 118/82  Pulse: 62  Resp: 16  Temp: 97.6 F (36.4 C)  TempSrc: Oral  Weight: 172 lb (78 kg)     Physical Exam  Constitutional: He is oriented to person, place, and time. He appears well-developed and well-nourished.  HENT:  Head: Normocephalic and atraumatic.  Right Ear: External ear normal.  Left Ear: External ear normal.  Nose: Nose normal.  Eyes: Conjunctivae and EOM are normal. Pupils are equal, round, and reactive to light.  Neck: Normal range of motion. Neck supple.  Cardiovascular: Normal rate, regular rhythm, normal heart sounds and intact distal pulses.  Pulmonary/Chest: Effort normal and breath sounds normal.  Musculoskeletal: Normal range of motion.  Neurological: He is alert and oriented to person, place, and time. He has normal reflexes.  Skin: Skin is warm and dry.  Psychiatric: He has a normal mood and affect. His behavior is normal. Judgment and thought content normal.  Assessment & Plan:     1. Hypercholesterolemia  - Lipid panel - Hepatic function panel  2. Depression, unspecified depression type Stable.  3.h/o CVA     HPI, Exam, and A&P Transcribed under the direction and in the presence of Richard L. Cranford Mon, MD  Electronically Signed: Katina Dung, CMA I have done the exam and reviewed the chart and it is accurate to the best of my knowledge. Development worker, community has been used and  any errors in dictation or transcription are unintentional. Miguel Aschoff M.D. [place], MD  [facility]

## 2017-12-10 DIAGNOSIS — E7800 Pure hypercholesterolemia, unspecified: Secondary | ICD-10-CM | POA: Diagnosis not present

## 2017-12-11 LAB — LIPID PANEL
CHOL/HDL RATIO: 2.3 ratio (ref 0.0–5.0)
Cholesterol, Total: 131 mg/dL (ref 100–199)
HDL: 56 mg/dL (ref >39)
LDL CALC: 67 mg/dL (ref 0–99)
Triglycerides: 40 mg/dL (ref 0–149)
VLDL Cholesterol Cal: 8 mg/dL (ref 5–40)

## 2017-12-11 LAB — HEPATIC FUNCTION PANEL
ALBUMIN: 4.1 g/dL (ref 3.5–5.5)
ALT: 29 IU/L (ref 0–44)
AST: 21 IU/L (ref 0–40)
Alkaline Phosphatase: 75 IU/L (ref 39–117)
BILIRUBIN TOTAL: 0.7 mg/dL (ref 0.0–1.2)
BILIRUBIN, DIRECT: 0.18 mg/dL (ref 0.00–0.40)
Total Protein: 6.5 g/dL (ref 6.0–8.5)

## 2017-12-30 ENCOUNTER — Other Ambulatory Visit: Payer: Self-pay | Admitting: Family Medicine

## 2017-12-30 MED ORDER — ATORVASTATIN CALCIUM 80 MG PO TABS: 80.0000 mg | ORAL_TABLET | Freq: Every day | ORAL | 3 refills | Status: DC

## 2017-12-30 MED ORDER — VENLAFAXINE HCL 75 MG PO TABS: 75.0000 mg | ORAL_TABLET | Freq: Two times a day (BID) | ORAL | 3 refills | Status: DC

## 2017-12-30 NOTE — Telephone Encounter (Signed)
Sent!

## 2017-12-30 NOTE — Telephone Encounter (Addendum)
[facility] pharmacy faxed a refill request for a 90-days supply for the following medications. Thanks CC  atorvastatin (LIPITOR) 80 MG tablet   venlafaxine (EFFEXOR) 75 MG tablet

## 2018-01-20 DIAGNOSIS — L02412 Cutaneous abscess of left axilla: Secondary | ICD-10-CM | POA: Diagnosis not present

## 2018-01-20 DIAGNOSIS — L0201 Cutaneous abscess of face: Secondary | ICD-10-CM | POA: Diagnosis not present

## 2018-02-01 ENCOUNTER — Other Ambulatory Visit: Payer: Self-pay | Admitting: Family Medicine

## 2018-02-04 DIAGNOSIS — L570 Actinic keratosis: Secondary | ICD-10-CM | POA: Diagnosis not present

## 2018-02-04 DIAGNOSIS — L218 Other seborrheic dermatitis: Secondary | ICD-10-CM | POA: Diagnosis not present

## 2018-02-04 DIAGNOSIS — L728 Other follicular cysts of the skin and subcutaneous tissue: Secondary | ICD-10-CM | POA: Diagnosis not present

## 2018-02-04 DIAGNOSIS — X32XXXA Exposure to sunlight, initial encounter: Secondary | ICD-10-CM | POA: Diagnosis not present

## 2018-03-31 DIAGNOSIS — H4720 Unspecified optic atrophy: Secondary | ICD-10-CM | POA: Diagnosis not present

## 2018-04-19 DIAGNOSIS — L03312 Cellulitis of back [any part except buttock]: Secondary | ICD-10-CM | POA: Diagnosis not present

## 2018-05-16 ENCOUNTER — Other Ambulatory Visit: Payer: Self-pay | Admitting: Family Medicine

## 2018-05-16 NOTE — Telephone Encounter (Signed)
Pharmacy requesting refills. Thanks!  

## 2018-05-23 DIAGNOSIS — I69320 Aphasia following cerebral infarction: Secondary | ICD-10-CM | POA: Diagnosis not present

## 2018-05-23 DIAGNOSIS — E782 Mixed hyperlipidemia: Secondary | ICD-10-CM | POA: Diagnosis not present

## 2018-05-23 DIAGNOSIS — R002 Palpitations: Secondary | ICD-10-CM | POA: Diagnosis not present

## 2018-06-13 ENCOUNTER — Telehealth: Payer: Self-pay

## 2018-06-13 NOTE — Telephone Encounter (Signed)
LM for pt to CB and schedule AWV after 07/13/18. -MM

## 2018-06-22 NOTE — Telephone Encounter (Signed)
TP scheduled AWV for 07/15/18 @ 10 AM. -MM

## 2018-07-07 DIAGNOSIS — L218 Other seborrheic dermatitis: Secondary | ICD-10-CM | POA: Diagnosis not present

## 2018-07-07 DIAGNOSIS — X32XXXA Exposure to sunlight, initial encounter: Secondary | ICD-10-CM | POA: Diagnosis not present

## 2018-07-07 DIAGNOSIS — L570 Actinic keratosis: Secondary | ICD-10-CM | POA: Diagnosis not present

## 2018-07-15 ENCOUNTER — Ambulatory Visit (INDEPENDENT_AMBULATORY_CARE_PROVIDER_SITE_OTHER): Payer: PPO

## 2018-07-15 VITALS — BP 110/84 | HR 71 | Temp 98.2°F | Ht 69.0 in | Wt 170.8 lb

## 2018-07-15 DIAGNOSIS — Z0000 Encounter for general adult medical examination without abnormal findings: Secondary | ICD-10-CM | POA: Diagnosis not present

## 2018-07-15 DIAGNOSIS — Z23 Encounter for immunization: Secondary | ICD-10-CM | POA: Diagnosis not present

## 2018-07-15 DIAGNOSIS — Z114 Encounter for screening for human immunodeficiency virus [HIV]: Secondary | ICD-10-CM | POA: Diagnosis not present

## 2018-07-15 NOTE — Progress Notes (Signed)
Subjective:   Johnathan Campbell is a 57 y.o. male who presents for Medicare Annual/Subsequent preventive examination.  Review of Systems:  N/A  Cardiac Risk Factors include: advanced age (>66men, >44 women);male gender     Objective:    Vitals: BP 110/84 (BP Location: Right Arm)   Pulse 71   Temp 98.2 F (36.8 C) (Oral)   Ht 5\' 9"  (1.753 m)   Wt 170 lb 12.8 oz (77.5 kg)   BMI 25.22 kg/m   Body mass index is 25.22 kg/m.  Advanced Directives 07/15/2018 08/10/2017 07/13/2017 07/23/2016 01/14/2016  Does Patient Have a Medical Advance Directive? No No No No No  Would patient like information on creating a medical advance directive? Yes (MAU/Ambulatory/Procedural Areas - Information given) - Yes (ED - Information included in AVS) - -    Tobacco Social History   Tobacco Use  Smoking Status Never Smoker  Smokeless Tobacco Never Used     Counseling given: Not Answered   Clinical Intake:  Pre-visit preparation completed: Yes  Pain : No/denies pain Pain Score: 0-No pain     Nutritional Status: BMI 25 -29 Overweight Nutritional Risks: None Diabetes: No  How often do you need to have someone help you when you read instructions, pamphlets, or other written materials from your doctor or pharmacy?: 1 - Never  Interpreter Needed?: No  Information entered by :: [facility], LPN  Past Medical History:  Diagnosis Date  . Acute intractable headache    unspecified headache type  . Allergy   . Aphasia S/P CVA   . Clot    blood clot  . Completed stroke ([place])   . Hypercholesterolemia    Past Surgical History:  Procedure Laterality Date  . HERNIA REPAIR  twice   Family History  Problem Relation Age of Onset  . Hypertension Mother   . Hyperlipidemia Mother   . Hypertension Father   . Hyperlipidemia Father    Social History   Socioeconomic History  . Marital status: Divorced    Spouse name: Not on file  . Number of children: 3  . Years of education: Not on file  .  Highest education level: Bachelor's degree (e.g., BA, AB, BS)  Occupational History  . 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 Financial resource strain: Not hard at all  . Food insecurity:    Worry: Never true    Inability: Never true  . Transportation needs:    Medical: No    Non-medical: No  Tobacco Use  . Smoking status: Never Smoker  . Smokeless tobacco: Never Used  Substance and Sexual Activity  . Alcohol use: Yes    Comment: socially- 1/2 drinks at a time  . Drug use: No  . Sexual activity: Never  Lifestyle  . Physical activity:    Days per week: Not on file    Minutes per session: Not on file  . Stress: Not at all  Relationships  . Social connections:    Talks on phone: Not on file    Gets together: Not on file    Attends religious service: Not on file    Active member of club or organization: Not on file    Attends meetings of clubs or organizations: Not on file    Relationship status: Not on file  Other Topics Concern  . Not on file  Social History Narrative  . Not on file    Outpatient Encounter  Medications as of 07/15/2018  Medication Sig  . aspirin 325 MG tablet Take 1 tablet by mouth every other day.   Marland Kitchen atorvastatin (LIPITOR) 80 MG tablet Take 1 tablet (80 mg total) by mouth at bedtime.  . hydrocortisone (PROCTOZONE-HC) 2.5 % rectal cream Place 1 application rectally 2 (two) times daily. (Patient taking differently: Place 1 application rectally 2 (two) times daily. )  . montelukast (SINGULAIR) 10 MG tablet TAKE 1 TABLET (10 MG TOTAL) BY MOUTH AT BEDTIME.  Marland Kitchen venlafaxine (EFFEXOR) 75 MG tablet Take 1 tablet (75 mg total) by mouth 2 (two) times daily.  . naproxen (NAPROSYN) 500 MG tablet TAKE 1 TABLET (500 MG TOTAL) BY MOUTH 2 (TWO) TIMES DAILY WITH A MEAL. as needed (Patient not taking: Reported on 07/15/2018)   No facility-administered encounter medications on file as of 07/15/2018.     Activities of  Daily Living In your present state of health, do you have any difficulty performing the following activities: 07/15/2018 08/10/2017  Hearing? N N  Vision? N N  Difficulty concentrating or making decisions? N N  Walking or climbing stairs? N N  Dressing or bathing? N N  Doing errands, shopping? N N  Preparing Food and eating ? N -  Using the Toilet? N -  In the past six months, have you accidently leaked urine? N -  Do you have problems with loss of bowel control? N -  Managing your Medications? N -  Managing your Finances? N -  Housekeeping or managing your Housekeeping? N -  Some recent data might be hidden    Patient Care Team: Jerrol Banana., MD as PCP - General (Family Medicine) Vladimir Crofts, MD as Consulting Physician (Neurology) Estill Cotta, MD as Consulting Physician (Ophthalmology)   Assessment:   This is a routine wellness examination for [employer].  Exercise Activities and Dietary recommendations Current Exercise Habits: The patient does not participate in regular exercise at present, Exercise limited by: None identified  Goals    . Increase water intake     Continue drinking 6-8 glasses of water a day.       Fall Risk Fall Risk  07/15/2018 08/10/2017 07/13/2017 07/23/2016 11/18/2015  Falls in the past year? No No No No No   Is the patient's home free of loose throw rugs in walkways, pet beds, electrical cords, etc?   yes      Grab bars in the bathroom? yes      Handrails on the stairs?   no      Adequate lighting?   yes  Timed Get Up and Go Performed: N/A  Depression Screen PHQ 2/9 Scores 07/15/2018 12/07/2017 08/10/2017 07/13/2017  PHQ - 2 Score 1 2 2  0  PHQ- 9 Score - 6 6 -    Cognitive Function     6CIT Screen 07/15/2018 07/13/2017  What Year? 0 points 0 points  What month? 0 points 3 points  What time? 0 points 0 points  Count back from 20 0 points 0 points  Months in reverse 4 points 4 points  Repeat phrase 2 points 2 points  Total Score 6 9     Immunization History  Administered Date(s) Administered  . Influenza,inj,Quad PF,6+ Mos 07/15/2015, 07/23/2016, 07/13/2017, 07/15/2018  . Td 03/31/2005  . Tdap 08/22/2017    Qualifies for Shingles Vaccine? N/A  Screening Tests Health Maintenance  Topic Date Due  . HIV Screening  01/31/1975  . COLONOSCOPY  02/14/2019  . TETANUS/TDAP  08/23/2027  .  INFLUENZA VACCINE  Completed  . Hepatitis C Screening  Completed   Cancer Screenings: Lung: Low Dose CT Chest recommended if Age 46-80 years, 30 pack-year currently smoking OR have quit w/in 15years. Patient does not qualify. Colorectal: Up to date   Additional Screenings:  Hepatitis C Screening: Up to date      Plan:  I have personally reviewed and addressed the Medicare Annual Wellness questionnaire and have noted the following in the patient's chart:  A. Medical and social history B. Use of alcohol, tobacco or illicit drugs  C. Current medications and supplements D. Functional ability and status E.  Nutritional status F.  Physical activity G. Advance directives H. List of other physicians I.  Hospitalizations, surgeries, and ER visits in previous 12 months J.  [place] such as hearing and vision if needed, cognitive and depression L. Referrals and appointments - none  In addition, I have reviewed and discussed with patient certain preventive protocols, quality metrics, and best practice recommendations. A written personalized care plan for preventive services as well as general preventive health recommendations were provided to patient.  See attached scanned questionnaire for additional information.   Signed,  Fabio Neighbors, LPN Nurse Health Advisor   Nurse Recommendations: None.

## 2018-07-15 NOTE — Patient Instructions (Signed)
Mr. Caldera , Thank you for taking time to come for your Medicare Wellness Visit. I appreciate your ongoing commitment to your health goals. Please review the following plan we discussed and let me know if I can assist you in the future.   Screening recommendations/referrals: Colonoscopy: Up to date Recommended yearly ophthalmology/optometry visit for glaucoma screening and checkup Recommended yearly dental visit for hygiene and checkup  Vaccinations: Influenza vaccine: Up to date Pneumococcal vaccine: N/A Tdap vaccine: Up to date Shingles vaccine: N/A    Advanced directives: Advance directive discussed with you today. I have provided a copy for you to complete at home and have notarized. Once this is complete please bring a copy in to our office so we can scan it into your chart.  Conditions/risks identified: Recommend to continue increasing water intake to 6-8 glasses a day.  Next appointment: 08/02/18 with Dr Rosanna Randy.   Preventive Care 40-64 Years, Male Preventive care refers to lifestyle choices and visits with your health care provider that can promote health and wellness. What does preventive care include?  A yearly physical exam. This is also called an annual well check.  Dental exams once or twice a year.  Routine eye exams. Ask your health care provider how often you should have your eyes checked.  Personal lifestyle choices, including:  Daily care of your teeth and gums.  Regular physical activity.  Eating a healthy diet.  Avoiding tobacco and drug use.  Limiting alcohol use.  Practicing safe sex.  Taking low-dose aspirin every day starting at age 67. What happens during an annual well check? The services and screenings done by your health care provider during your annual well check will depend on your age, overall health, lifestyle risk factors, and family history of disease. Counseling  Your health care provider may ask you questions about your:  Alcohol  use.  Tobacco use.  Drug use.  Emotional well-being.  Home and relationship well-being.  Sexual activity.  Eating habits.  Work and work Statistician. Screening  You may have the following tests or measurements:  Height, weight, and BMI.  Blood pressure.  Lipid and cholesterol levels. These may be checked every 5 years, or more frequently if you are over 49 years old.  Skin check.  Lung cancer screening. You may have this screening every year starting at age 24 if you have a 30-pack-year history of smoking and currently smoke or have quit within the past 15 years.  Fecal occult blood test (FOBT) of the stool. You may have this test every year starting at age 85.  Flexible sigmoidoscopy or colonoscopy. You may have a sigmoidoscopy every 5 years or a colonoscopy every 10 years starting at age 94.  Prostate cancer screening. Recommendations will vary depending on your family history and other risks.  Hepatitis C blood test.  Hepatitis B blood test.  Sexually transmitted disease (STD) testing.  Diabetes screening. This is done by checking your blood sugar (glucose) after you have not eaten for a while (fasting). You may have this done every 1-3 years. Discuss your test results, treatment options, and if necessary, the need for more tests with your health care provider. Vaccines  Your health care provider may recommend certain vaccines, such as:  Influenza vaccine. This is recommended every year.  Tetanus, diphtheria, and acellular pertussis (Tdap, Td) vaccine. You may need a Td booster every 10 years.  Zoster vaccine. You may need this after age 89.  Pneumococcal 13-valent conjugate (PCV13) vaccine. You may  need this if you have certain conditions and have not been vaccinated.  Pneumococcal polysaccharide (PPSV23) vaccine. You may need one or two doses if you smoke cigarettes or if you have certain conditions. Talk to your health care provider about which screenings  and vaccines you need and how often you need them. This information is not intended to replace advice given to you by your health care provider. Make sure you discuss any questions you have with your health care provider. Document Released: 11/15/2015 Document Revised: 07/08/2016 Document Reviewed: 08/20/2015 Elsevier Interactive Patient Education  2017 [employer] Prevention in the Home Falls can cause injuries. They can happen to people of all ages. There are many things you can do to make your home safe and to help prevent falls. What can I do on the outside of my home?  Regularly fix the edges of walkways and driveways and fix any cracks.  Remove anything that might make you trip as you walk through a door, such as a raised step or threshold.  Trim any bushes or trees on the path to your home.  Use bright outdoor lighting.  Clear any walking paths of anything that might make someone trip, such as rocks or tools.  Regularly check to see if handrails are loose or broken. Make sure that both sides of any steps have handrails.  Any raised decks and porches should have guardrails on the edges.  Have any leaves, snow, or ice cleared regularly.  Use sand or salt on walking paths during winter.  Clean up any spills in your garage right away. This includes oil or grease spills. What can I do in the bathroom?  Use night lights.  Install grab bars by the toilet and in the tub and shower. Do not use towel bars as grab bars.  Use non-skid mats or decals in the tub or shower.  If you need to sit down in the shower, use a plastic, non-slip stool.  Keep the floor dry. Clean up any water that spills on the floor as soon as it happens.  Remove soap buildup in the tub or shower regularly.  Attach bath mats securely with double-sided non-slip rug tape.  Do not have throw rugs and other things on the floor that can make you trip. What can I do in the bedroom?  Use night  lights.  Make sure that you have a light by your bed that is easy to reach.  Do not use any sheets or blankets that are too big for your bed. They should not hang down onto the floor.  Have a firm chair that has side arms. You can use this for support while you get dressed.  Do not have throw rugs and other things on the floor that can make you trip. What can I do in the kitchen?  Clean up any spills right away.  Avoid walking on wet floors.  Keep items that you use a lot in easy-to-reach places.  If you need to reach something above you, use a strong step stool that has a grab bar.  Keep electrical cords out of the way.  Do not use floor polish or wax that makes floors slippery. If you must use wax, use non-skid floor wax.  Do not have throw rugs and other things on the floor that can make you trip. What can I do with my stairs?  Do not leave any items on the stairs.  Make sure that there are  handrails on both sides of the stairs and use them. Fix handrails that are broken or loose. Make sure that handrails are as long as the stairways.  Check any carpeting to make sure that it is firmly attached to the stairs. Fix any carpet that is loose or worn.  Avoid having throw rugs at the top or bottom of the stairs. If you do have throw rugs, attach them to the floor with carpet tape.  Make sure that you have a light switch at the top of the stairs and the bottom of the stairs. If you do not have them, ask someone to add them for you. What else can I do to help prevent falls?  Wear shoes that:  Do not have high heels.  Have rubber bottoms.  Are comfortable and fit you well.  Are closed at the toe. Do not wear sandals.  If you use a stepladder:  Make sure that it is fully opened. Do not climb a closed stepladder.  Make sure that both sides of the stepladder are locked into place.  Ask someone to hold it for you, if possible.  Clearly [place] and make sure that you can  see:  Any grab bars or handrails.  First and last steps.  Where the edge of each step is.  Use tools that help you move around (mobility aids) if they are needed. These include:  Canes.  Walkers.  Scooters.  Crutches.  Turn on the lights when you go into a dark area. Replace any light bulbs as soon as they burn out.  Set up your furniture so you have a clear path. Avoid moving your furniture around.  If any of your floors are uneven, fix them.  If there are any pets around you, be aware of where they are.  Review your medicines with your doctor. Some medicines can make you feel dizzy. This can increase your chance of falling. Ask your doctor what other things that you can do to help prevent falls. This information is not intended to replace advice given to you by your health care provider. Make sure you discuss any questions you have with your health care provider. Document Released: 08/15/2009 Document Revised: 03/26/2016 Document Reviewed: 11/23/2014 Elsevier Interactive Patient Education  2017 [employer].

## 2018-07-16 LAB — HIV ANTIBODY (ROUTINE TESTING W REFLEX): HIV Screen 4th Generation wRfx: NONREACTIVE

## 2018-07-18 ENCOUNTER — Telehealth: Payer: Self-pay

## 2018-07-18 NOTE — Telephone Encounter (Signed)
-----   Message from Jerrol Banana., MD sent at 07/18/2018 12:36 PM EDT ----- HIV negative.

## 2018-07-18 NOTE — Telephone Encounter (Signed)
[facility]  ED   ----- Message from Jerrol Banana., MD sent at 07/18/2018 12:36 PM EDT ----- HIV negative.

## 2018-07-19 ENCOUNTER — Telehealth: Payer: Self-pay | Admitting: Family Medicine

## 2018-07-19 NOTE — Telephone Encounter (Signed)
Pt returned call about lab results. Please advise. Thanks TNP

## 2018-07-19 NOTE — Telephone Encounter (Signed)
Please review. Thanks!  

## 2018-07-20 NOTE — Telephone Encounter (Signed)
[facility] ED 

## 2018-07-20 NOTE — Telephone Encounter (Signed)
Advised patient of results.  

## 2018-07-25 DIAGNOSIS — I69320 Aphasia following cerebral infarction: Secondary | ICD-10-CM | POA: Diagnosis not present

## 2018-07-25 DIAGNOSIS — R2990 Unspecified symptoms and signs involving the nervous system: Secondary | ICD-10-CM | POA: Diagnosis not present

## 2018-07-30 DIAGNOSIS — R569 Unspecified convulsions: Secondary | ICD-10-CM | POA: Diagnosis not present

## 2018-07-30 DIAGNOSIS — I69320 Aphasia following cerebral infarction: Secondary | ICD-10-CM | POA: Diagnosis not present

## 2018-08-02 ENCOUNTER — Encounter: Payer: Self-pay | Admitting: Family Medicine

## 2018-08-02 ENCOUNTER — Ambulatory Visit (INDEPENDENT_AMBULATORY_CARE_PROVIDER_SITE_OTHER): Payer: PPO | Admitting: Family Medicine

## 2018-08-02 VITALS — BP 120/78 | HR 74 | Temp 98.8°F | Resp 16 | Ht 69.0 in | Wt 169.0 lb

## 2018-08-02 DIAGNOSIS — E7800 Pure hypercholesterolemia, unspecified: Secondary | ICD-10-CM

## 2018-08-02 DIAGNOSIS — F329 Major depressive disorder, single episode, unspecified: Secondary | ICD-10-CM

## 2018-08-02 DIAGNOSIS — I639 Cerebral infarction, unspecified: Secondary | ICD-10-CM | POA: Diagnosis not present

## 2018-08-02 DIAGNOSIS — F32A Depression, unspecified: Secondary | ICD-10-CM

## 2018-08-02 NOTE — Progress Notes (Signed)
Patient: Johnathan Campbell Male    DOB: Aug 22, 1961   57 y.o.   MRN: [id number] Visit Date: 08/02/2018  Today's Provider: Wilhemena Durie, MD   Chief Complaint  Patient presents with  . Hyperlipidemia  . Depression   Subjective:    HPI  His daughter is getting married next weekend and due to that he is missing his 40th high school reunion.  Lipid/Cholesterol, Follow-up:   Last seen for this 6 months ago.  Management changes since that visit include no changes. . Last Lipid Panel:    Component Value Date/Time   CHOL 131 12/10/2017 0942   TRIG 40 12/10/2017 0942   HDL 56 12/10/2017 0942   CHOLHDL 2.3 12/10/2017 0942   CHOLHDL 2.1 08/11/2017 0852   LDLCALC 67 12/10/2017 0942   LDLCALC 63 08/11/2017 0852    He reports good compliance with treatment. He is not having side effects.  Current symptoms include none and have been stable. Weight trend: stable Prior visit with dietician: no Current diet: well balanced Current exercise: no regular exercise, but he does stay active.   Wt Readings from Last 3 Encounters:  08/02/18 169 lb (76.7 kg)  07/15/18 170 lb 12.8 oz (77.5 kg)  12/07/17 172 lb (78 kg)   Depression, follow up: Patient was last seen for this about 7 months ago. No changes were made in his medications. He is currently taking venlafaxine 75mg  twice daily, and reports good symptom control.   No Known Allergies   Current Outpatient Medications:  .  aspirin 325 MG tablet, Take 1 tablet by mouth every other day. , Disp: , Rfl:  .  atorvastatin (LIPITOR) 80 MG tablet, Take 1 tablet (80 mg total) by mouth at bedtime., Disp: 90 tablet, Rfl: 3 .  hydrocortisone (PROCTOZONE-HC) 2.5 % rectal cream, Place 1 application rectally 2 (two) times daily. (Patient taking differently: Place 1 application rectally 2 (two) times daily. ), Disp: 30 g, Rfl: 0 .  montelukast (SINGULAIR) 10 MG tablet, TAKE 1 TABLET (10 MG TOTAL) BY MOUTH AT BEDTIME., Disp: 90 tablet, Rfl:  3 .  venlafaxine (EFFEXOR) 75 MG tablet, Take 1 tablet (75 mg total) by mouth 2 (two) times daily., Disp: 180 tablet, Rfl: 3 .  naproxen (NAPROSYN) 500 MG tablet, TAKE 1 TABLET (500 MG TOTAL) BY MOUTH 2 (TWO) TIMES DAILY WITH A MEAL. as needed (Patient not taking: Reported on 07/15/2018), Disp: 180 tablet, Rfl: 3  Review of Systems  Constitutional: Negative for activity change, appetite change, chills, diaphoresis, fatigue, fever and unexpected weight change.  Respiratory: Negative for cough and shortness of breath.   Cardiovascular: Negative for chest pain, palpitations and leg swelling.  Musculoskeletal: Negative for arthralgias and back pain.  Neurological: Negative for dizziness, light-headedness and headaches.  Psychiatric/Behavioral: Negative for agitation, confusion, decreased concentration, dysphoric mood, self-injury, sleep disturbance and suicidal ideas. The patient is not nervous/anxious.     Social History   Tobacco Use  . Smoking status: Never Smoker  . Smokeless tobacco: Never Used  Substance Use Topics  . Alcohol use: Yes    Comment: socially- 1/2 drinks at a time   Objective:   BP 120/78 (BP Location: Left Arm, Patient Position: Sitting, Cuff Size: Normal)   Pulse 74   Temp 98.8 F (37.1 C)   Resp 16   Ht 5\' 9"  (1.753 m)   Wt 169 lb (76.7 kg)   SpO2 97%   BMI 24.96 kg/m  Vitals:  08/02/18 0808  BP: 120/78  Pulse: 74  Resp: 16  Temp: 98.8 F (37.1 C)  SpO2: 97%  Weight: 169 lb (76.7 kg)  Height: 5\' 9"  (1.753 m)     Physical Exam  Constitutional: He is oriented to person, place, and time. He appears well-developed and well-nourished.  HENT:  Head: Normocephalic and atraumatic.  Eyes: No scleral icterus.  Neck: Normal range of motion. Neck supple.  Cardiovascular: Normal rate, regular rhythm and normal heart sounds.  Pulmonary/Chest: Effort normal and breath sounds normal.  Neurological: He is alert and oriented to person, place, and time.  Skin:  Skin is warm and dry.  Psychiatric: He has a normal mood and affect. His behavior is normal. Judgment and thought content normal.  Nursing note and vitals reviewed.       Assessment & Plan:     1. Hypercholesterolemia Stable. Continue current medications. F/U in 6 months.   2. Depression, unspecified depression type Stable. Continue current medications.  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

## 2018-09-10 ENCOUNTER — Emergency Department
Admission: EM | Admit: 2018-09-10 | Discharge: 2018-09-10 | Disposition: A | Discharge status: Home / Self Care | Payer: PPO | Attending: Emergency Medicine | Admitting: Emergency Medicine

## 2018-09-10 ENCOUNTER — Other Ambulatory Visit: Payer: Self-pay

## 2018-09-10 ENCOUNTER — Emergency Department: Payer: PPO

## 2018-09-10 ENCOUNTER — Encounter: Payer: Self-pay | Admitting: Emergency Medicine

## 2018-09-10 DIAGNOSIS — Y9389 Activity, other specified: Secondary | ICD-10-CM | POA: Diagnosis not present

## 2018-09-10 DIAGNOSIS — W270XXA Contact with workbench tool, initial encounter: Secondary | ICD-10-CM | POA: Insufficient documentation

## 2018-09-10 DIAGNOSIS — Y929 Unspecified place or not applicable: Secondary | ICD-10-CM | POA: Insufficient documentation

## 2018-09-10 DIAGNOSIS — S61012A Laceration without foreign body of left thumb without damage to nail, initial encounter: Secondary | ICD-10-CM | POA: Diagnosis not present

## 2018-09-10 DIAGNOSIS — Z8673 Personal history of transient ischemic attack (TIA), and cerebral infarction without residual deficits: Secondary | ICD-10-CM | POA: Diagnosis not present

## 2018-09-10 DIAGNOSIS — Z79899 Other long term (current) drug therapy: Secondary | ICD-10-CM | POA: Diagnosis not present

## 2018-09-10 DIAGNOSIS — Y998 Other external cause status: Secondary | ICD-10-CM | POA: Diagnosis not present

## 2018-09-10 DIAGNOSIS — Z7982 Long term (current) use of aspirin: Secondary | ICD-10-CM | POA: Diagnosis not present

## 2018-09-10 DIAGNOSIS — Z23 Encounter for immunization: Secondary | ICD-10-CM | POA: Diagnosis not present

## 2018-09-10 DIAGNOSIS — E7800 Pure hypercholesterolemia, unspecified: Secondary | ICD-10-CM | POA: Diagnosis not present

## 2018-09-10 MED ORDER — BACITRACIN-NEOMYCIN-POLYMYXIN 400-5-5000 EX OINT
TOPICAL_OINTMENT | Freq: Once | CUTANEOUS | Status: AC
  Administered 2018-09-10: 1 via TOPICAL
  Filled 2018-09-10: qty 1

## 2018-09-10 MED ORDER — LIDOCAINE HCL (PF) 1 % IJ SOLN
5.0000 mL | Freq: Once | INTRAMUSCULAR | Status: AC
  Administered 2018-09-10: 5 mL via INTRADERMAL
  Filled 2018-09-10: qty 5

## 2018-09-10 MED ORDER — TETANUS-DIPHTH-ACELL PERTUSSIS 5-2.5-18.5 LF-MCG/0.5 IM SUSP
0.5000 mL | Freq: Once | INTRAMUSCULAR | Status: AC
  Administered 2018-09-10: 0.5 mL via INTRAMUSCULAR
  Filled 2018-09-10: qty 0.5

## 2018-09-10 NOTE — ED Notes (Signed)
E sig pad not working properly.  Pt verbalized understanding of all d/c instructions and wound care. Family also verbalized understanding.

## 2018-09-10 NOTE — ED Triage Notes (Signed)
Cut L thumb with saw about 3pm, bandage over in triage with no bleed through noted.

## 2018-09-10 NOTE — Discharge Instructions (Addendum)
The area with soap and water daily.  Keep the area covered when in public.  If the area is no longer draining you may leave it open while at home to allow the oxygen to help the area heal.  Return if there is any sign of infection or see your regular doctor.

## 2018-09-10 NOTE — ED Notes (Signed)
Pt reports cutting left thumb on circular saw today, pt elevating thumb at this time. Pending results from XR.

## 2018-09-10 NOTE — ED Provider Notes (Signed)
[facility] Emergency Department Provider Note  ____________________________________________   First MD Initiated Contact with Patient 09/10/18 1633     (approximate)  I have reviewed the triage vital signs and the nursing notes.   HISTORY  Chief Complaint Laceration    HPI Johnathan Campbell is a 57 y.o. male [facility] emergency department complaining of a laceration to the left thumb.  He was using a salt and ended up cutting his finger.  He has been to to urgent cares which refused to suture the laceration.  He is unsure of his last tetanus.    Past Medical History:  Diagnosis Date  . Acute intractable headache    unspecified headache type  . Allergy   . Aphasia S/P CVA   . Clot    blood clot  . Completed stroke ([place])   . Hypercholesterolemia     Patient Active Problem List   Diagnosis Date Noted  . Depression 01/16/2016  . Cephalalgia 06/03/2015  . Problems influencing health status 06/03/2015  . 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 Angiopathy 06/03/2015  . Completed stroke ([place]) 06/03/2015  . Hypercholesterolemia 06/03/2015    Past Surgical History:  Procedure Laterality Date  . HERNIA REPAIR  twice    Prior to Admission medications   Medication Sig Start Date End Date Taking? Authorizing Provider  aspirin 325 MG tablet Take 1 tablet by mouth every other day.     [provider]  atorvastatin (LIPITOR) 80 MG tablet Take 1 tablet (80 mg total) by mouth at bedtime. 12/30/17   Jerrol Banana., MD  hydrocortisone (PROCTOZONE-HC) 2.5 % rectal cream Place 1 application rectally 2 (two) times daily. Patient taking differently: Place 1 application rectally 2 (two) times daily.  08/10/17   Jerrol Banana., MD  montelukast (SINGULAIR) 10 MG tablet TAKE 1 TABLET (10 MG TOTAL) BY MOUTH AT BEDTIME. 02/01/18   Jerrol Banana., MD  naproxen (NAPROSYN) 500 MG tablet TAKE 1 TABLET (500 MG TOTAL) BY  MOUTH 2 (TWO) TIMES DAILY WITH A MEAL. as needed Patient not taking: Reported on 07/15/2018 05/17/18   Jerrol Banana., MD  venlafaxine [facility]) 75 MG tablet Take 1 tablet (75 mg total) by mouth 2 (two) times daily. 12/30/17   Jerrol Banana., MD    Allergies Patient has no known allergies.  Family History  Problem Relation Age of Onset  . Hypertension Mother   . Hyperlipidemia Mother   . Hypertension Father   . Hyperlipidemia Father     Social History Social History   Tobacco Use  . Smoking status: Never Smoker  . Smokeless tobacco: Never Used  Substance Use Topics  . Alcohol use: Yes    Comment: socially- 1/2 drinks at a time  . Drug use: No    Review of Systems  Constitutional: No fever/chills Eyes: No visual changes. ENT: No sore throat. Respiratory: Denies cough Genitourinary: Negative for dysuria. Musculoskeletal: Negative for back pain.  Positive for left thumb pain and laceration  skin: Negative for rash.    ____________________________________________   PHYSICAL EXAM:  VITAL SIGNS: ED Triage Vitals  Enc Vitals Group     BP 09/10/18 1616 128/76     Pulse Rate 09/10/18 1616 63     Resp 09/10/18 1616 18     Temp 09/10/18 1616 98.2 F (36.8 C)     Temp Source 09/10/18 1616 Oral     SpO2 09/10/18  1616 98 %     Weight 09/10/18 1617 170 lb (77.1 kg)     Height 09/10/18 1617 5\' 10"  (1.778 m)     Head Circumference --      Peak Flow --      Pain Score 09/10/18 1616 5     Pain Loc --      Pain Edu? --      Excl. in [place]? --     Constitutional: Alert and oriented. Well appearing and in no acute distress. Eyes: Conjunctivae are normal.  Head: Atraumatic. Nose: No congestion/rhinnorhea. Mouth/Throat: Mucous membranes are moist.   Neck:  supple no lymphadenopathy noted Cardiovascular: Normal rate, regular rhythm.  Respiratory: Normal respiratory effort.  No retractions GU: deferred Musculoskeletal: FROM all extremities, warm and well  perfused.  Positive for a laceration distally.  It stretches from the side of the thumb all the way across the nail area.  Patient has full range of motion is neurovascularly intact. Neurologic:  Normal speech and language.  Skin:  Skin is warm, dry . No rash noted. Psychiatric: Mood and affect are normal. Speech and behavior are normal.  ____________________________________________   LABS (all labs ordered are listed, but only abnormal results are displayed)  Labs Reviewed - No data to display ____________________________________________   ____________________________________________  RADIOLOGY  X-ray of the left thumb is negative for fracture of fb  ____________________________________________   PROCEDURES  Procedure(s) performed:   Marland KitchenMarland KitchenLaceration Repair Date/Time: 09/10/2018 5:29 PM Performed by: Versie Starks, PA-C Authorized by: Versie Starks, PA-C   Consent:    Consent obtained:  Verbal   Consent given by:  Patient   Risks discussed:  Infection, pain, poor wound healing and retained foreign body   Alternatives discussed:  Observation Anesthesia (see MAR for exact dosages):    Anesthesia method:  Nerve block   Block needle gauge:  25 G   Block anesthetic:  Lidocaine 1% w/o epi   Block injection procedure:  Anatomic landmarks identified, introduced needle, incremental injection, anatomic landmarks palpated and negative aspiration for blood   Block outcome:  Anesthesia achieved Laceration details:    Location:  Finger   Finger location:  L thumb   Length (cm):  2   Depth (mm):  2 Repair type:    Repair type:  Simple Pre-procedure details:    Preparation:  Patient was prepped and draped in usual sterile fashion Exploration:    Hemostasis achieved with:  Direct pressure   Wound exploration: wound explored through full range of motion     Wound extent: no foreign bodies/material noted, no tendon damage noted and no underlying fracture noted     Contaminated:  no   Treatment:    Area cleansed with:  Betadine and saline   Amount of cleaning:  Standard   Irrigation solution:  Sterile saline   Irrigation method:  Pressure wash, syringe and tap Skin repair:    Repair method:  Sutures   Suture size:  5-0   Suture material:  Nylon   Suture technique:  Simple interrupted   Number of sutures:  6 Approximation:    Approximation:  Close Post-procedure details:    Dressing:  Antibiotic ointment and non-adherent dressing   Patient tolerance of procedure:  Tolerated well, no immediate complications      ____________________________________________   INITIAL IMPRESSION / ASSESSMENT AND PLAN / ED COURSE  Pertinent labs & imaging results that were available during my care of the patient were reviewed by me  and considered in my medical decision making (see chart for details).   Patient is a 57 year old male presents emergency department with laceration to left thumb.  On physical exam the laceration is at the distal portion of the left thumb crossing over to the area beneath the nail.  X-ray of the left thumb is negative for fracture or foreign body  The area was sutured with 5-0 Ethilon.  6 simple sutures were placed.  See procedure note.  The patient was given instructions on how to care for the sutures.  The area was dressed by the nurses.  He is to follow-up with his regular doctor in 7 to 10 days for suture removal.  If there is any sign of infection he should be the return to emergency department or see his regular doctor.  Patient states he understands will comply.  He was discharged in stable condition.     As part of my medical decision making, I reviewed the following data within the [facility] notes reviewed and incorporated, Old chart reviewed, Radiograph reviewed x-ray of the left thumb is negative, Notes from prior ED visits and [place] Controlled Substance  Database  ____________________________________________   FINAL CLINICAL IMPRESSION(S) / ED DIAGNOSES  Final diagnoses:  Laceration of left thumb without foreign body, nail damage status unspecified, initial encounter      NEW MEDICATIONS STARTED DURING THIS VISIT:  New Prescriptions   No medications on file     Note:  This document was prepared using Dragon voice recognition software and may include unintentional dictation errors.    Versie Starks, PA-C 09/10/18 1732    Schuyler Amor, MD 09/10/18 [phone]

## 2018-09-16 ENCOUNTER — Encounter: Payer: Self-pay | Admitting: Physician Assistant

## 2018-09-16 ENCOUNTER — Ambulatory Visit (INDEPENDENT_AMBULATORY_CARE_PROVIDER_SITE_OTHER): Payer: PPO | Admitting: Physician Assistant

## 2018-09-16 VITALS — BP 140/86 | HR 71 | Temp 98.1°F | Resp 16 | Wt 177.0 lb

## 2018-09-16 DIAGNOSIS — S61012S Laceration without foreign body of left thumb without damage to nail, sequela: Secondary | ICD-10-CM | POA: Diagnosis not present

## 2018-09-16 NOTE — Progress Notes (Signed)
Patient: Johnathan Campbell Male    DOB: 02/04/1961   57 y.o.   MRN: [id number] Visit Date: 09/16/2018  Today's Provider: Trinna Post, PA-C   Chief Complaint  Patient presents with  . Suture / Staple Removal   Subjective:    HPI  Suture Removal Patient presents today for suture removal. Patient was seen at 09/10/18 at [facility] for laceration of left thumb after cutting it on a saw. Patient has 6 sutures. Patient had tetanus shot updated in ER.     No Known Allergies   Current Outpatient Medications:  .  aspirin 325 MG tablet, Take 1 tablet by mouth every other day. , Disp: , Rfl:  .  atorvastatin (LIPITOR) 80 MG tablet, Take 1 tablet (80 mg total) by mouth at bedtime., Disp: 90 tablet, Rfl: 3 .  hydrocortisone (PROCTOZONE-HC) 2.5 % rectal cream, Place 1 application rectally 2 (two) times daily. (Patient taking differently: Place 1 application rectally 2 (two) times daily. ), Disp: 30 g, Rfl: 0 .  montelukast (SINGULAIR) 10 MG tablet, TAKE 1 TABLET (10 MG TOTAL) BY MOUTH AT BEDTIME., Disp: 90 tablet, Rfl: 3 .  naproxen (NAPROSYN) 500 MG tablet, TAKE 1 TABLET (500 MG TOTAL) BY MOUTH 2 (TWO) TIMES DAILY WITH A MEAL. as needed (Patient not taking: Reported on 07/15/2018), Disp: 180 tablet, Rfl: 3 .  venlafaxine (EFFEXOR) 75 MG tablet, Take 1 tablet (75 mg total) by mouth 2 (two) times daily., Disp: 180 tablet, Rfl: 3  Review of Systems  Constitutional: Negative.   Cardiovascular: Negative.   Skin: Positive for wound.    Social History   Tobacco Use  . Smoking status: Never Smoker  . Smokeless tobacco: Never Used  Substance Use Topics  . Alcohol use: Yes    Comment: socially- 1/2 drinks at a time   Objective:   BP 140/86 (BP Location: Left Arm, Patient Position: Sitting, Cuff Size: Normal)   Pulse 71   Temp 98.1 F (36.7 C) (Oral)   Resp 16   Wt 177 lb (80.3 kg)   SpO2 98%   BMI 25.40 kg/m  Vitals:   09/16/18 0936  BP: 140/86  Pulse: 71  Resp: 16  Temp:  98.1 F (36.7 C)  TempSrc: Oral  SpO2: 98%  Weight: 177 lb (80.3 kg)     Physical Exam  Constitutional: He is oriented to person, place, and time.  Neurological: He is alert and oriented to person, place, and time. He has normal strength. No sensory deficit.  Skin: Skin is warm and dry.  Laceration on left thumb approximated with several stitches. Area of crusted skin and dried blood surrounding, minimal erythema. No purulence.   Psychiatric: He has a normal mood and affect. His behavior is normal.        Assessment & Plan:     1. Laceration of left thumb, foreign body presence unspecified, nail damage status unspecified, sequela  Not quite one week since stitches placed. Hesitant to remove today as laceration overlies joint line subject to frequent motion. Recommend he return Monday for suture removal.   Return in about 3 days (around 09/19/2018).  The entirety of the information documented in the History of Present Illness, Review of Systems and Physical Exam were personally obtained by me. Portions of this information were initially documented by Lynford Humphrey, CMA and reviewed by me for thoroughness and accuracy.          Trinna Post, PA-C  [facility]

## 2018-09-16 NOTE — Patient Instructions (Signed)
Suture Removal, Care After Refer to this sheet in the next few weeks. These instructions provide you with information on caring for yourself after your procedure. Your health care provider may also give you more specific instructions. Your treatment has been planned according to current medical practices, but problems sometimes occur. Call your health care provider if you have any problems or questions after your procedure. What can I expect after the procedure? After your stitches (sutures) are removed, it is typical to have the following:  Some discomfort and swelling in the wound area.  Slight redness in the area.  Follow these instructions at home:  If you have skin adhesive strips over the wound area, do not take the strips off. They will fall off on their own in a few days. If the strips remain in place after 14 days, you may remove them.  Change any bandages (dressings) at least once a day or as directed by your health care provider. If the bandage sticks, soak it off with warm, soapy water.  Apply cream or ointment only as directed by your health care provider. If using cream or ointment, wash the area with soap and water 2 times a day to remove all the cream or ointment. Rinse off the soap and pat the area dry with a clean towel.  Keep the wound area dry and clean. If the bandage becomes wet or dirty, or if it develops a bad smell, change it as soon as possible.  Continue to protect the wound from injury.  Use sunscreen when out in the sun. New scars become sunburned easily. Contact a health care provider if:  You have increasing redness, swelling, or pain in the wound.  You see pus coming from the wound.  You have a fever.  You notice a bad smell coming from the wound or dressing.  Your wound breaks open (edges not staying together). This information is not intended to replace advice given to you by your health care provider. Make sure you discuss any questions you have  with your health care provider. Document Released: 07/14/2001 Document Revised: 03/26/2016 Document Reviewed: 05/31/2013 Elsevier Interactive Patient Education  2017 Elsevier Inc.  

## 2018-09-19 ENCOUNTER — Ambulatory Visit (INDEPENDENT_AMBULATORY_CARE_PROVIDER_SITE_OTHER): Payer: PPO | Admitting: Family Medicine

## 2018-09-19 ENCOUNTER — Encounter: Payer: Self-pay | Admitting: Family Medicine

## 2018-09-19 VITALS — BP 120/80 | HR 69 | Temp 98.8°F | Resp 16 | Wt 174.8 lb

## 2018-09-19 DIAGNOSIS — S61012S Laceration without foreign body of left thumb without damage to nail, sequela: Secondary | ICD-10-CM | POA: Diagnosis not present

## 2018-09-19 DIAGNOSIS — Z4802 Encounter for removal of sutures: Secondary | ICD-10-CM

## 2018-09-19 MED ORDER — CEPHALEXIN 500 MG PO CAPS: 500.0000 mg | ORAL_CAPSULE | Freq: Two times a day (BID) | ORAL | 0 refills | Status: DC

## 2018-09-19 NOTE — Progress Notes (Signed)
  Subjective:     Patient ID: Johnathan Campbell, male   DOB: 29-Nov-1960, 57 y.o.   MRN: [id number] Chief Complaint  Patient presents with  . Suture / Staple Removal    Patient comes in office today after being seen at [facility] on 09/10/18 for a laceration to his thumb. Patients last reported Tdap was 09/10/18, patient reports today some redness and swelling at the site around his thumb.   HPI Reportedly had 6 sutures placed for a saw injury.  Review of Systems     Objective:   Physical Exam  Constitutional: He appears well-developed and well-nourished. No distress.  Skin:  Left thumb with residual erythema at base of his thumb. Eschar present which partially occludes sutures. Attempted eschar removal with saline soak and alcohol pads. 5 sutures identified and removed atraumatically. I am unable to find a sixth despite careful probing.       Assessment:    1. Visit for suture removal  2. Laceration of left thumb, foreign body presence unspecified, nail damage status unspecified, sequela: will cover to possible secondary infection - cephALEXin (KEFLEX) 500 MG capsule; Take 1 capsule (500 mg total) by mouth 2 (two) times daily.  Dispense: 14 capsule; Refill: 0    Plan:    Discussed saline soaks and dressing.He will return if a sixth suture is identified for  removal.

## 2018-09-19 NOTE — Patient Instructions (Signed)
Soak your finger in saline for 5 minutes daily then redress your finger. If you see any residual suture come in for removal. If wound has increased redness or pus also let us know.

## 2018-09-20 ENCOUNTER — Telehealth: Payer: Self-pay | Admitting: Family Medicine

## 2018-09-20 ENCOUNTER — Ambulatory Visit (INDEPENDENT_AMBULATORY_CARE_PROVIDER_SITE_OTHER): Payer: PPO | Admitting: Family Medicine

## 2018-09-20 DIAGNOSIS — Z4802 Encounter for removal of sutures: Secondary | ICD-10-CM

## 2018-09-20 NOTE — Telephone Encounter (Signed)
I told him I would remove it without charge-schedule a nurse visit this PM

## 2018-09-20 NOTE — Telephone Encounter (Signed)
Pt has one more stitch that needs to be taken out that was overlooked from yesterday.  Please advise pt on what he needs to do.  Thanks, [employer]

## 2018-09-20 NOTE — Telephone Encounter (Signed)
Would you like me to schedule follow up appt? KW

## 2018-09-20 NOTE — Telephone Encounter (Signed)
Pt seen in office this afternoon. KW

## 2018-09-20 NOTE — Progress Notes (Signed)
Here to remove a residual suture not seen during office visit yesterday. Once again I am not able to visualize any suture as hard eschar remains. Have recommended he continue to soak in saline daily to soften eschar. If suture visible may return or remove himself (instructions given).

## 2018-09-21 ENCOUNTER — Emergency Department: Payer: PPO

## 2018-09-21 ENCOUNTER — Encounter: Payer: Self-pay | Admitting: Emergency Medicine

## 2018-09-21 ENCOUNTER — Emergency Department
Admission: EM | Admit: 2018-09-21 | Discharge: 2018-09-21 | Disposition: A | Discharge status: Home / Self Care | Payer: PPO | Attending: Emergency Medicine | Admitting: Emergency Medicine

## 2018-09-21 ENCOUNTER — Other Ambulatory Visit: Payer: Self-pay

## 2018-09-21 DIAGNOSIS — R4182 Altered mental status, unspecified: Secondary | ICD-10-CM | POA: Diagnosis not present

## 2018-09-21 DIAGNOSIS — Z7982 Long term (current) use of aspirin: Secondary | ICD-10-CM | POA: Diagnosis not present

## 2018-09-21 DIAGNOSIS — Z79899 Other long term (current) drug therapy: Secondary | ICD-10-CM | POA: Insufficient documentation

## 2018-09-21 DIAGNOSIS — J9811 Atelectasis: Secondary | ICD-10-CM | POA: Diagnosis not present

## 2018-09-21 DIAGNOSIS — R569 Unspecified convulsions: Secondary | ICD-10-CM | POA: Insufficient documentation

## 2018-09-21 DIAGNOSIS — I10 Essential (primary) hypertension: Secondary | ICD-10-CM | POA: Diagnosis not present

## 2018-09-21 DIAGNOSIS — R404 Transient alteration of awareness: Secondary | ICD-10-CM | POA: Diagnosis not present

## 2018-09-21 DIAGNOSIS — R456 Violent behavior: Secondary | ICD-10-CM | POA: Diagnosis not present

## 2018-09-21 DIAGNOSIS — R4020 Unspecified coma: Secondary | ICD-10-CM | POA: Diagnosis not present

## 2018-09-21 LAB — URINE DRUG SCREEN, QUALITATIVE (ARMC ONLY)
Amphetamines, Ur Screen: NOT DETECTED
BARBITURATES, UR SCREEN: NOT DETECTED
Benzodiazepine, Ur Scrn: POSITIVE — AB
CANNABINOID 50 NG, UR ~~LOC~~: NOT DETECTED
COCAINE METABOLITE, UR ~~LOC~~: NOT DETECTED
MDMA (ECSTASY) UR SCREEN: NOT DETECTED
Methadone Scn, Ur: NOT DETECTED
OPIATE, UR SCREEN: NOT DETECTED
Phencyclidine (PCP) Ur S: NOT DETECTED
TRICYCLIC, UR SCREEN: NOT DETECTED

## 2018-09-21 LAB — CBC WITH DIFFERENTIAL/PLATELET
Abs Immature Granulocytes: 0.03 10*3/uL (ref 0.00–0.07)
BASOS ABS: 0 10*3/uL (ref 0.0–0.1)
Basophils Relative: 1 %
EOS ABS: 0.1 10*3/uL (ref 0.0–0.5)
EOS PCT: 1 %
HEMATOCRIT: 45.7 % (ref 39.0–52.0)
Hemoglobin: 15.2 g/dL (ref 13.0–17.0)
IMMATURE GRANULOCYTES: 1 %
LYMPHS ABS: 1.4 10*3/uL (ref 0.7–4.0)
Lymphocytes Relative: 25 %
MCH: 31.2 pg (ref 26.0–34.0)
MCHC: 33.3 g/dL (ref 30.0–36.0)
MCV: 93.8 fL (ref 80.0–100.0)
Monocytes Absolute: 0.5 10*3/uL (ref 0.1–1.0)
Monocytes Relative: 9 %
NEUTROS PCT: 63 %
NRBC: 0 % (ref 0.0–0.2)
Neutro Abs: 3.5 10*3/uL (ref 1.7–7.7)
Platelets: 193 10*3/uL (ref 150–400)
RBC: 4.87 MIL/uL (ref 4.22–5.81)
RDW: 12.4 % (ref 11.5–15.5)
WBC: 5.5 10*3/uL (ref 4.0–10.5)

## 2018-09-21 LAB — COMPREHENSIVE METABOLIC PANEL
ALBUMIN: 3.8 g/dL (ref 3.5–5.0)
ALT: 29 U/L (ref 0–44)
ANION GAP: 10 (ref 5–15)
AST: 30 U/L (ref 15–41)
Alkaline Phosphatase: 65 U/L (ref 38–126)
BUN: 18 mg/dL (ref 6–20)
CHLORIDE: 104 mmol/L (ref 98–111)
CO2: 27 mmol/L (ref 22–32)
Calcium: 9 mg/dL (ref 8.9–10.3)
Creatinine, Ser: 0.97 mg/dL (ref 0.61–1.24)
GFR calc Af Amer: >60 mL/min (ref >60)
GFR calc non Af Amer: >60 mL/min (ref >60)
GLUCOSE: 96 mg/dL (ref 70–99)
POTASSIUM: 4 mmol/L (ref 3.5–5.1)
Sodium: 141 mmol/L (ref 135–145)
Total Bilirubin: 1.2 mg/dL (ref 0.3–1.2)
Total Protein: 6.7 g/dL (ref 6.5–8.1)

## 2018-09-21 LAB — ACETAMINOPHEN LEVEL: Acetaminophen (Tylenol), Serum: <10 ug/mL — ABNORMAL LOW (ref 10–30)

## 2018-09-21 LAB — LACTIC ACID, PLASMA: Lactic Acid, Venous: 1.1 mmol/L (ref 0.5–1.9)

## 2018-09-21 LAB — URINALYSIS, COMPLETE (UACMP) WITH MICROSCOPIC
BACTERIA UA: NONE SEEN
BILIRUBIN URINE: NEGATIVE
Glucose, UA: NEGATIVE mg/dL
HGB URINE DIPSTICK: NEGATIVE
Ketones, ur: NEGATIVE mg/dL
LEUKOCYTES UA: NEGATIVE
NITRITE: NEGATIVE
Protein, ur: NEGATIVE mg/dL
SPECIFIC GRAVITY, URINE: 1.005 (ref 1.005–1.030)
Squamous Epithelial / LPF: NONE SEEN (ref 0–5)
pH: 6 (ref 5.0–8.0)

## 2018-09-21 LAB — CG4 I-STAT (LACTIC ACID)
LACTIC ACID, VENOUS: 0.94 mmol/L (ref 0.5–1.9)
Lactic Acid, Venous: 3.35 mmol/L (ref 0.5–1.9)

## 2018-09-21 LAB — TROPONIN I

## 2018-09-21 LAB — ETHANOL: Alcohol, Ethyl (B): <10 mg/dL (ref <10)

## 2018-09-21 MED ORDER — LEVETIRACETAM IN NACL 1000 MG/100ML IV SOLN
1000.0000 mg | Freq: Once | INTRAVENOUS | Status: AC
  Administered 2018-09-21: 1000 mg via INTRAVENOUS
  Filled 2018-09-21: qty 100

## 2018-09-21 MED ORDER — SODIUM CHLORIDE 0.9 % IV SOLN
Freq: Once | INTRAVENOUS | Status: AC
  Administered 2018-09-21: 10:00:00 via INTRAVENOUS

## 2018-09-21 MED ORDER — LEVETIRACETAM 500 MG PO TABS: 500.0000 mg | ORAL_TABLET | Freq: Two times a day (BID) | ORAL | 2 refills | Status: DC

## 2018-09-21 MED ORDER — MIDAZOLAM HCL 2 MG/2ML IJ SOLN
INTRAMUSCULAR | Status: AC
  Filled 2018-09-21: qty 4

## 2018-09-21 MED ORDER — FLUMAZENIL 0.5 MG/5ML IV SOLN
INTRAVENOUS | Status: AC
  Filled 2018-09-21: qty 5

## 2018-09-21 NOTE — ED Notes (Signed)
X-ray at bedside

## 2018-09-21 NOTE — Discharge Instructions (Signed)
Please call Dr. Manuella Ghazi and arrange follow-up in the next week or so.  Please take the Keppra 1 pill twice a day.  Please return for any further seizures.  Please remember not to drive, climb ladders, take baths (showers are okay) or do any other activities where a seizure might result in serious injury to you or others.

## 2018-09-21 NOTE — ED Notes (Signed)
Patient has brother and mom at bedside

## 2018-09-21 NOTE — ED Notes (Signed)
Pt placed on 6L via [place] by MD. O2 sats 98%.

## 2018-09-21 NOTE — ED Notes (Signed)
Patient clothes removed due to being soaked in urine and patient cleaned and placed in brief

## 2018-09-21 NOTE — ED Provider Notes (Addendum)
[facility] Emergency Department Provider Note   ____________________________________________   First MD Initiated Contact with Patient 09/21/18 [phone]     (approximate)  I have reviewed the triage vital signs and the nursing notes.   HISTORY  Chief Complaint Altered Mental Status History limited by altered mental status  HPI Johnathan Campbell is a 57 y.o. male who was brought in by EMS.  EMS reports family told him he woke up normally this morning cleaned up and then went into the living room where his family then found him unresponsive.  They called EMS.  EMS got there he was unresponsive when he did a sternal rub he woke up and became very combative he required Versed 5 IM twice and Haldol 5 IM he still very combative so they called [facility] as they were planning on taking him there and Glean Salen told him to give him another 5 of Versed IM which point he calmed down.  Patient comes into the emergency room.  His only past medical history is a stroke about 5 years ago.  Patient is currently unresponsive.  He urinated on himself after the first few doses of IM medicine.  History obtained by EMS.  Past Medical History:  Diagnosis Date  . Acute intractable headache    unspecified headache type  . Allergy   . Aphasia S/P CVA   . Clot    blood clot  . Completed stroke ([place])   . Hypercholesterolemia     Patient Active Problem List   Diagnosis Date Noted  . Depression 01/16/2016  . Cephalalgia 06/03/2015  . Problems influencing health status 06/03/2015  . 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 Angiopathy 06/03/2015  . Completed stroke ([place]) 06/03/2015  . Hypercholesterolemia 06/03/2015    Past Surgical History:  Procedure Laterality Date  . HERNIA REPAIR  twice    Prior to Admission medications   Medication Sig Start Date End Date Taking? Authorizing Provider  aspirin 325 MG tablet Take 1 tablet by mouth every  other day.     [provider]  atorvastatin (LIPITOR) 80 MG tablet Take 1 tablet (80 mg total) by mouth at bedtime. 12/30/17   Jerrol Banana., MD  cephALEXin (KEFLEX) 500 MG capsule Take 1 capsule (500 mg total) by mouth 2 (two) times daily. 09/19/18   Carmon Ginsberg, PA  hydrocortisone (PROCTOZONE-HC) 2.5 % rectal cream Place 1 application rectally 2 (two) times daily. Patient taking differently: Place 1 application rectally 2 (two) times daily.  08/10/17   Jerrol Banana., MD  levETIRAcetam (KEPPRA) 500 MG tablet Take 1 tablet (500 mg total) by mouth 2 (two) times daily. 09/21/18   Nena Polio, MD  montelukast (SINGULAIR) 10 MG tablet TAKE 1 TABLET (10 MG TOTAL) BY MOUTH AT BEDTIME. 02/01/18   Jerrol Banana., MD  naproxen (NAPROSYN) 500 MG tablet TAKE 1 TABLET (500 MG TOTAL) BY MOUTH 2 (TWO) TIMES DAILY WITH A MEAL. as needed 05/17/18   Jerrol Banana., MD  venlafaxine [facility]) 75 MG tablet Take 1 tablet (75 mg total) by mouth 2 (two) times daily. 12/30/17   Jerrol Banana., MD    Allergies Patient has no known allergies.  Family History  Problem Relation Age of Onset  . Hypertension Mother   . Hyperlipidemia Mother   . Hypertension Father   . Hyperlipidemia Father     Social History Social History  Tobacco Use  . Smoking status: Never Smoker  . Smokeless tobacco: Never Used  Substance Use Topics  . Alcohol use: Yes    Comment: socially- 1/2 drinks at a time  . Drug use: No    Review of Systems    ____________________________________________   PHYSICAL EXAM:  VITAL SIGNS: ED Triage Vitals  Enc Vitals Group     BP 09/21/18 0921 108/71     Pulse Rate 09/21/18 0921 77     Resp 09/21/18 0921 14     Temp 09/21/18 0921 (!) 97.5 F (36.4 C)     Temp Source 09/21/18 0921 Axillary     SpO2 09/21/18 0921 (!) 89 %     Weight 09/21/18 0913 170 lb (77.1 kg)     Height 09/21/18 0913 5\' 9"  (1.753 m)     Head Circumference --        Peak Flow --      Pain Score 09/21/18 0913 Asleep     Pain Loc --      Pain Edu? --      Excl. in [place]? --     Constitutional: Alert and oriented. Well appearing and in no acute distress. Eyes: Conjunctivae are normal. PERRL. EOMI. Head: Atraumatic. Nose: No congestion/rhinnorhea. Mouth/Throat: Mucous membranes are moist.  Oropharynx non-erythematous. Neck: No stridor.   Cardiovascular: Normal rate, regular rhythm. Grossly normal heart sounds.  Good peripheral circulation. Respiratory: Normal respiratory effort.  No retractions. Lungs CTAB. Gastrointestinal: Soft and nontender. No distention. No abdominal bruits. No CVA tenderness. }Musculoskeletal: No lower extremity tenderness nor edema.  No joint effusions. Neurologic: Patient was baseline trouble speaking and word finding.  He is almost back to baseline per his brother.  Cranial nerves II through XII are intact of the visual fields were not checked motor strength is 5/5 throughout sensation appears to be intact there is no ataxia in the arms or legs. Skin:  Skin is warm, dry and intact. No rash noted. Psychiatric: Mood and affect are normal. Speech and behavior are normal.  ____________________________________________   LABS (all labs ordered are listed, but only abnormal results are displayed)  Labs Reviewed  URINALYSIS, COMPLETE (UACMP) WITH MICROSCOPIC - Abnormal; Notable for the following components:      Result Value   Color, Urine STRAW (*)    APPearance CLEAR (*)    All other components within normal limits  URINE DRUG SCREEN, QUALITATIVE ([facility] ONLY) - Abnormal; Notable for the following components:   Benzodiazepine, Ur Scrn POSITIVE (*)    All other components within normal limits  ACETAMINOPHEN LEVEL - Abnormal; Notable for the following components:   Acetaminophen (Tylenol), Serum <10 (*)    All other components within normal limits  CG4 I-STAT (LACTIC ACID) - Abnormal; Notable for the following components:    Lactic Acid, Venous 3.35 (*)    All other components within normal limits  COMPREHENSIVE METABOLIC PANEL  TROPONIN I  LACTIC ACID, PLASMA  CBC WITH DIFFERENTIAL/PLATELET  ETHANOL  LACTIC ACID, PLASMA  CG4 I-STAT (LACTIC ACID)   ____________________________________________  EKG  EKG read and interpreted by me shows sinus bradycardia rate of 55 normal axis no acute changes.  Is reading slight ST segment depression I do not see it ____________________________________________  RADIOLOGY  ED MD interpretation: mrI read by radiology as no acute disease  Official radiology report(s): Ct Head Wo Contrast  Result Date: 09/21/2018 CLINICAL DATA:  Altered level of consciousness. EXAM: CT HEAD WITHOUT CONTRAST TECHNIQUE: Contiguous axial images were  obtained from the base of the skull through the vertex without intravenous contrast. COMPARISON:  MRI 05/19/2016 FINDINGS: Brain: Area of encephalomalacia and old infarct in the left temporal lobe and parietal lobe. No acute intracranial abnormality. Specifically, no hemorrhage, hydrocephalus, mass lesion, acute infarction, or significant intracranial injury. Vascular: No hyperdense vessel or unexpected calcification. Skull: No acute calvarial abnormality. Sinuses/Orbits: Visualized paranasal sinuses and mastoids clear. Orbital soft tissues unremarkable. Other: None IMPRESSION: Old left temporal/parietal infarct and encephalomalacia. No acute intracranial abnormality. Electronically Signed   By: Rolm Baptise M.D.   On: 09/21/2018 10:11   Mr Brain Wo Contrast  Result Date: 09/21/2018 CLINICAL DATA:  Probable seizure.  Unresponsive. EXAM: MRI HEAD WITHOUT CONTRAST TECHNIQUE: Multiplanar, multiecho pulse sequences of the brain and surrounding structures were obtained without intravenous contrast. COMPARISON:  CT same day.  MRI 05/19/2016. FINDINGS: Brain: Diffusion imaging does not show any acute or subacute infarction. Brainstem and cerebellum are normal.  Right cerebral hemisphere is normal. Left cerebral hemisphere shows old infarction in the left MCA territory affecting the insula and frontoparietal cortical and subcortical brain. There is atrophy, encephalomalacia and gliosis with hemosiderin deposition. No evidence of mass lesion, recent hemorrhage, hydrocephalus or extra-axial collection. Vascular: Major vessels at the base of the brain show flow. Left ICA appears smaller than the right. Skull and upper cervical spine: Negative Sinuses/Orbits: Clear/normal Other: None IMPRESSION: No acute finding. Old infarction in the left MCA territory with atrophy, encephalomalacia, gliosis and hemosiderin deposition. No apparent change since 2017. Electronically Signed   By: Nelson Chimes M.D.   On: 09/21/2018 14:13   Dg Chest Portable 1 View  Result Date: 09/21/2018 CLINICAL DATA:  Syncope EXAM: PORTABLE CHEST 1 VIEW COMPARISON:  None. FINDINGS: Low lung volumes with bibasilar atelectasis. Heart size is accentuated by the low volumes and portable nature of the study, likely within normal limits. No effusions or acute bony abnormality. IMPRESSION: Low volumes, bibasilar atelectasis. Electronically Signed   By: Rolm Baptise M.D.   On: 09/21/2018 09:57    ____________________________________________   PROCEDURES  Procedure(s) performed:   Procedures  Critical Care performed: Critical care time 45 minutes including reviewing the patient's findings discussing the patient with neurology reexamining him and speaking with his mother.  ____________________________________________   INITIAL IMPRESSION / ASSESSMENT AND PLAN / ED COURSE  Discussed this patient with Dr. Doy Mince neurology.  Patient's speech is somewhat more slurry than usual.  He does have repeat residual a aphasia from the first stroke.  He did get 15 of Versed.  His lactic acid is up.  I think he has had a seizure but his mother is somewhat worried so Dr. Doy Mince will get an MRI of his head and  then we will make sure there is been no new injury she will see him immediately after the MRI.   Patient seen by Dr. Doy Mince now she agrees we will give him Keppra 500 p.o. twice daily after an IV load and follow-up with Dr. Manuella Ghazi.   ____________________________________________   FINAL CLINICAL IMPRESSION(S) / ED DIAGNOSES  Final diagnoses:  Seizures [facility])     ED Discharge Orders         Ordered    levETIRAcetam (KEPPRA) 500 MG tablet  2 times daily     09/21/18 1451           Note:  This document was prepared using Dragon voice recognition software and may include unintentional dictation errors.    Nena Polio, MD 09/21/18 1452  Nena Polio, MD 10/04/18 [phone]

## 2018-09-21 NOTE — ED Notes (Signed)
Patient transported to MRI 

## 2018-09-21 NOTE — Consult Note (Signed)
Reason for Consult: Seizures Referring Physician: Cinda Quest. [name]  CC: Seizure-like activity  HPI: Johnathan Campbell is an 57 y.o. male with history of left MCA stroke (2014), aphasia as late effect of stroke, hyperlipidemia, and chronic headache presenting to the ED with witnessed seizure-like activity.  Per patient's mother who witnessed the episode, patient was sitting in the delivery room when suddenly he yelled out and was flaring his arms and legs.  She states that patient was combative and uncontrollable prior to becoming unresponsive.  When EMS arrived patient was responsive to painful stimuli then suddenly became combative with EMS requiring administration of Versed 5 mg IM and Haldol.  Patient lost control of his bladder with intermittent snoring respiration followed by post ictal phase lasting few minutes.  On arrival to the ED patient was placed on 6 L of oxygen by nasal cannula.  Initial labs unremarkable.  CT head showed old left temporal/parietal infarct and encephalomalacia.  Follow-up MRI of the brain did not show acute finding.  Old infarction in the left MCA territory with atrophy, encephalomalacia, gliosis and hemosiderin deposition noted.  He was recently seen by his neurologist on 07/25/2018 with complaints of transient alteration of awareness with few episodes where it is as if things were "flashing" by him as if he was in a dream.  He had a EEG performed 07/30/2018 that was abnormal due to presence of left temporal theater slowing and infrequent left mid temporal sharp waves with phase reversal at T3.  Symptoms appeared to have resolved at that time.  Past Medical History:  Diagnosis Date  . Acute intractable headache    unspecified headache type  . Allergy   . Aphasia S/P CVA   . Clot    blood clot  . Completed stroke ([place])   . Hypercholesterolemia     Past Surgical History:  Procedure Laterality Date  . HERNIA REPAIR  twice    Family History  Problem Relation Age of Onset   . Hypertension Mother   . Hyperlipidemia Mother   . Hypertension Father   . Hyperlipidemia Father     Social History:  reports that he has never smoked. He has never used smokeless tobacco. He reports that he drinks alcohol. He reports that he does not use drugs.  No Known Allergies  Medications:  I have reviewed the patient's current medications. Prior to Admission:  (Not in a hospital admission) Scheduled: . flumazenil      . midazolam       Prior to Admission medications   Medication Sig Start Date End Date Taking? Authorizing Provider  aspirin 325 MG tablet Take 1 tablet by mouth every other day.     [provider]  atorvastatin (LIPITOR) 80 MG tablet Take 1 tablet (80 mg total) by mouth at bedtime. 12/30/17   Jerrol Banana., MD  cephALEXin (KEFLEX) 500 MG capsule Take 1 capsule (500 mg total) by mouth 2 (two) times daily. 09/19/18   Carmon Ginsberg, PA  hydrocortisone (PROCTOZONE-HC) 2.5 % rectal cream Place 1 application rectally 2 (two) times daily. Patient taking differently: Place 1 application rectally 2 (two) times daily.  08/10/17   Jerrol Banana., MD  levETIRAcetam (KEPPRA) 500 MG tablet Take 1 tablet (500 mg total) by mouth 2 (two) times daily. 09/21/18   Nena Polio, MD  montelukast (SINGULAIR) 10 MG tablet TAKE 1 TABLET (10 MG TOTAL) BY MOUTH AT BEDTIME. 02/01/18   Jerrol Banana., MD  naproxen (NAPROSYN)  500 MG tablet TAKE 1 TABLET (500 MG TOTAL) BY MOUTH 2 (TWO) TIMES DAILY WITH A MEAL. as needed 05/17/18   Jerrol Banana., MD  venlafaxine [facility]) 75 MG tablet Take 1 tablet (75 mg total) by mouth 2 (two) times daily. 12/30/17   Jerrol Banana., MD    ROS: History obtained from the patient   General ROS: negative for - chills, fatigue, fever, night sweats, weight gain or weight loss Psychological ROS: negative for - behavioral disorder, hallucinations, memory difficulties, mood swings or suicidal ideation Ophthalmic  ROS: negative for - blurry vision, double vision, eye pain or loss of vision ENT ROS: negative for - epistaxis, nasal discharge, oral lesions, sore throat, tinnitus or vertigo Allergy and Immunology ROS: negative for - hives or itchy/watery eyes Hematological and Lymphatic ROS: negative for - bleeding problems, bruising or swollen lymph nodes Endocrine ROS: negative for - galactorrhea, hair pattern changes, polydipsia/polyuria or temperature intolerance Respiratory ROS: negative for - cough, hemoptysis, shortness of breath or wheezing Cardiovascular ROS: negative for - chest pain, dyspnea on exertion, edema or irregular heartbeat Gastrointestinal ROS: negative for - abdominal pain, diarrhea, hematemesis, nausea/vomiting or stool incontinence Genito-Urinary ROS: negative for - dysuria, hematuria, incontinence or urinary frequency/urgency Musculoskeletal ROS: negative for - joint swelling or muscular weakness Neurological ROS: as noted in HPI Dermatological ROS: negative for rash and skin lesion changes  Physical Examination: Blood pressure 107/70, pulse (!) 57, temperature 97.6 F (36.4 C), temperature source Oral, resp. rate 13, height 5\' 9"  (1.753 m), weight 77.1 kg, SpO2 100 %.  HEENT-  Normocephalic, no lesions, without obvious abnormality.  Normal external eye and conjunctiva.  Normal TM's bilaterally.  Normal auditory canals and external ears. Normal external nose, mucus membranes and septum.  Normal pharynx. Cardiovascular- S1, S2 normal, pulses palpable throughout   Lungs- chest clear, no wheezing, rales, normal symmetric air entry Abdomen- soft, non-tender; bowel sounds normal; no masses,  no organomegaly Extremities- no edema Lymph-no adenopathy palpable Musculoskeletal-no joint tenderness, deformity or swelling Skin-warm and dry, no hyperpigmentation, vitiligo, or suspicious lesions  Neurological Exam   Mental Status: Alert, oriented, thought content appropriate.  Speech mildly  slurred with evidence of aphasia.  Able to follow 3 step commands with some difficulty. Delayed processing and comprehension of information. Required multiple prompts and cueing.  Attention span and concentration seemed appropriate  Cranial Nerves: II: Discs flat bilaterally; Visual field deficit the left, pupils equal, round, reactive to light and accommodation III,IV, VI: ptosis not present, extra-ocular motions intact bilaterally V,VII: smile symmetric, facial light touch sensation intact VIII: hearing normal bilaterally IX,X: gag reflex present XI: bilateral shoulder shrug XII: midline tongue extension Motor: Right :  Upper extremity   5/5 Without pronator drift      Left: Upper extremity   5/5 without pronator drift Right:   Lower extremity   5/5                                          Left: Lower extremity   5/5 Tone and bulk:normal tone throughout; no atrophy noted Sensory: Pinprick and light touch intact bilaterally Deep Tendon Reflexes: 2+ and symmetric throughout Plantars: Right: mute                              Left: mute Cerebellar: Finger-to-nose testing intact  bilaterally. Heel to shin testing normal bilaterally Gait: not tested due to safety concerns  Data Reviewed  Laboratory Studies:   Basic Metabolic Panel: Recent Labs  Lab 09/21/18 0932  NA 141  K 4.0  CL 104  CO2 27  GLUCOSE 96  BUN 18  CREATININE 0.97  CALCIUM 9.0    Liver Function Tests: Recent Labs  Lab 09/21/18 0932  AST 30  ALT 29  ALKPHOS 65  BILITOT 1.2  PROT 6.7  ALBUMIN 3.8   No results for input(s): LIPASE, AMYLASE in the last 168 hours. No results for input(s): AMMONIA in the last 168 hours.  CBC: Recent Labs  Lab 09/21/18 0932  WBC 5.5  NEUTROABS 3.5  HGB 15.2  HCT 45.7  MCV 93.8  PLT 193    Cardiac Enzymes: Recent Labs  Lab 09/21/18 0932  TROPONINI <0.03    BNP: Invalid input(s): POCBNP  CBG: No results for input(s): GLUCAP in the last 168  hours.  Microbiology: No results found for this or any previous visit.  Coagulation Studies: No results for input(s): LABPROT, INR in the last 72 hours.  Urinalysis:  Recent Labs  Lab 09/21/18 1209  COLORURINE STRAW*  LABSPEC 1.005  PHURINE 6.0  GLUCOSEU NEGATIVE  HGBUR NEGATIVE  BILIRUBINUR NEGATIVE  KETONESUR NEGATIVE  PROTEINUR NEGATIVE  NITRITE NEGATIVE  LEUKOCYTESUR NEGATIVE    Lipid Panel:     Component Value Date/Time   CHOL 131 12/10/2017 0942   TRIG 40 12/10/2017 0942   HDL 56 12/10/2017 0942   CHOLHDL 2.3 12/10/2017 0942   CHOLHDL 2.1 08/11/2017 0852   LDLCALC 67 12/10/2017 0942   LDLCALC 63 08/11/2017 0852    HgbA1C: No results found for: HGBA1C  Urine Drug Screen:      Component Value Date/Time   LABOPIA NONE DETECTED 09/21/2018 1210   COCAINSCRNUR NONE DETECTED 09/21/2018 1210   LABBENZ POSITIVE (A) 09/21/2018 1210   AMPHETMU NONE DETECTED 09/21/2018 1210   THCU NONE DETECTED 09/21/2018 1210   LABBARB NONE DETECTED 09/21/2018 1210    Alcohol Level:  Recent Labs  Lab 09/21/18 1210  ETH <10    Other results: EKG: normal EKG, normal sinus rhythm, unchanged from previous tracings. Vent. rate 55 BPM PR interval * ms QRS duration 104 ms QT/QTc 427/409 ms P-R-T axes 67 50 0   Imaging: Ct Head Wo Contrast  Result Date: 09/21/2018 CLINICAL DATA:  Altered level of consciousness. EXAM: CT HEAD WITHOUT CONTRAST TECHNIQUE: Contiguous axial images were obtained from the base of the skull through the vertex without intravenous contrast. COMPARISON:  MRI 05/19/2016 FINDINGS: Brain: Area of encephalomalacia and old infarct in the left temporal lobe and parietal lobe. No acute intracranial abnormality. Specifically, no hemorrhage, hydrocephalus, mass lesion, acute infarction, or significant intracranial injury. Vascular: No hyperdense vessel or unexpected calcification. Skull: No acute calvarial abnormality. Sinuses/Orbits: Visualized paranasal sinuses  and mastoids clear. Orbital soft tissues unremarkable. Other: None IMPRESSION: Old left temporal/parietal infarct and encephalomalacia. No acute intracranial abnormality. Electronically Signed   By: Rolm Baptise M.D.   On: 09/21/2018 10:11   Mr Brain Wo Contrast  Result Date: 09/21/2018 CLINICAL DATA:  Probable seizure.  Unresponsive. EXAM: MRI HEAD WITHOUT CONTRAST TECHNIQUE: Multiplanar, multiecho pulse sequences of the brain and surrounding structures were obtained without intravenous contrast. COMPARISON:  CT same day.  MRI 05/19/2016. FINDINGS: Brain: Diffusion imaging does not show any acute or subacute infarction. Brainstem and cerebellum are normal. Right cerebral hemisphere is normal. Left cerebral hemisphere shows old  infarction in the left MCA territory affecting the insula and frontoparietal cortical and subcortical brain. There is atrophy, encephalomalacia and gliosis with hemosiderin deposition. No evidence of mass lesion, recent hemorrhage, hydrocephalus or extra-axial collection. Vascular: Major vessels at the base of the brain show flow. Left ICA appears smaller than the right. Skull and upper cervical spine: Negative Sinuses/Orbits: Clear/normal Other: None IMPRESSION: No acute finding. Old infarction in the left MCA territory with atrophy, encephalomalacia, gliosis and hemosiderin deposition. No apparent change since 2017. Electronically Signed   By: Nelson Chimes M.D.   On: 09/21/2018 14:13   Dg Chest Portable 1 View  Result Date: 09/21/2018 CLINICAL DATA:  Syncope EXAM: PORTABLE CHEST 1 VIEW COMPARISON:  None. FINDINGS: Low lung volumes with bibasilar atelectasis. Heart size is accentuated by the low volumes and portable nature of the study, likely within normal limits. No effusions or acute bony abnormality. IMPRESSION: Low volumes, bibasilar atelectasis. Electronically Signed   By: Rolm Baptise M.D.   On: 09/21/2018 09:57   Patient seen and examined.  Clinical course and management  discussed.  Necessary edits performed.  I agree with the above.  Assessment and plan of care developed and discussed below.   Assessment: 57 y.o male with history of left MCA stroke (2014), aphasia as late effect of stroke, hyperlipidemia, and chronic headache presenting to the ED with witnessed seizure-like activity.  MRI of the brain reviewed and shows no acute intracranial abnormality.  Old infarction in the left MCA territory with atrophy, encephalomalacia, gliosis and hemosiderin deposition noted.  This is likely the etiology for the patient's presentation.    Recommendations: 1.  Load with IV Keppra 1000 mg once 2.  Start maintenance dose of Keppra 500mg  po BID.  Patient to follow up with Dr. Manuella Ghazi on an outpatient basis for further management.  Possible side effects discussed. 3. Patient unable to drive, operate heavy machinery, perform activities at heights and participate in water activities until release by outpatient physician.  This patient was staffed with Dr. Magda Paganini, Doy Mince who personally evaluated patient, reviewed documentation and agreed with assessment and plan of care as above.  Rufina Falco, DNP, FNP-BC Board certified Nurse Practitioner Neurology Department  09/21/2018, 2:58 PM   Alexis Goodell, MD Neurology [phone]  09/21/2018  4:31 PM

## 2018-09-21 NOTE — ED Triage Notes (Signed)
Pt presents to ED via [facility]. Per EMS pt given 15mg  Versed IM and 5 Haldol IM en route. Per EMS pt was in living room with his family when he went unresponsive, upon EMS arrival pt responsive to painful stimuli. EMS reports patient then awoke and became combative with EMS. Pt currently responsive to painful stimuli, noted to have had episode of incontinence with intermittent snoring respirations.

## 2018-10-07 DIAGNOSIS — I69398 Other sequelae of cerebral infarction: Secondary | ICD-10-CM | POA: Diagnosis not present

## 2018-10-07 DIAGNOSIS — I69320 Aphasia following cerebral infarction: Secondary | ICD-10-CM | POA: Diagnosis not present

## 2018-10-07 DIAGNOSIS — G40909 Epilepsy, unspecified, not intractable, without status epilepticus: Secondary | ICD-10-CM | POA: Diagnosis not present

## 2018-10-08 DIAGNOSIS — G40909 Epilepsy, unspecified, not intractable, without status epilepticus: Secondary | ICD-10-CM | POA: Diagnosis not present

## 2018-10-08 DIAGNOSIS — I69398 Other sequelae of cerebral infarction: Secondary | ICD-10-CM | POA: Diagnosis not present

## 2018-10-08 DIAGNOSIS — I69320 Aphasia following cerebral infarction: Secondary | ICD-10-CM | POA: Diagnosis not present

## 2018-10-09 DIAGNOSIS — I69320 Aphasia following cerebral infarction: Secondary | ICD-10-CM | POA: Diagnosis not present

## 2018-10-09 DIAGNOSIS — I69398 Other sequelae of cerebral infarction: Secondary | ICD-10-CM | POA: Diagnosis not present

## 2018-10-09 DIAGNOSIS — G40909 Epilepsy, unspecified, not intractable, without status epilepticus: Secondary | ICD-10-CM | POA: Diagnosis not present

## 2018-10-12 DIAGNOSIS — I69320 Aphasia following cerebral infarction: Secondary | ICD-10-CM | POA: Diagnosis not present

## 2018-10-12 DIAGNOSIS — G40909 Epilepsy, unspecified, not intractable, without status epilepticus: Secondary | ICD-10-CM | POA: Diagnosis not present

## 2018-10-12 DIAGNOSIS — I69398 Other sequelae of cerebral infarction: Secondary | ICD-10-CM | POA: Diagnosis not present

## 2018-12-08 DIAGNOSIS — R001 Bradycardia, unspecified: Secondary | ICD-10-CM | POA: Diagnosis not present

## 2018-12-08 DIAGNOSIS — I69320 Aphasia following cerebral infarction: Secondary | ICD-10-CM | POA: Diagnosis not present

## 2018-12-08 DIAGNOSIS — E782 Mixed hyperlipidemia: Secondary | ICD-10-CM | POA: Diagnosis not present

## 2019-01-13 ENCOUNTER — Other Ambulatory Visit: Payer: Self-pay | Admitting: Family Medicine

## 2019-02-01 ENCOUNTER — Ambulatory Visit: Payer: Self-pay | Admitting: Family Medicine

## 2019-02-02 ENCOUNTER — Other Ambulatory Visit: Payer: Self-pay

## 2019-02-02 ENCOUNTER — Ambulatory Visit (INDEPENDENT_AMBULATORY_CARE_PROVIDER_SITE_OTHER): Payer: PPO | Admitting: Family Medicine

## 2019-02-02 ENCOUNTER — Encounter: Payer: Self-pay | Admitting: Family Medicine

## 2019-02-02 VITALS — BP 124/78 | HR 60 | Temp 98.5°F | Resp 16 | Ht 68.0 in | Wt 180.0 lb

## 2019-02-02 DIAGNOSIS — I639 Cerebral infarction, unspecified: Secondary | ICD-10-CM | POA: Diagnosis not present

## 2019-02-02 DIAGNOSIS — E7800 Pure hypercholesterolemia, unspecified: Secondary | ICD-10-CM

## 2019-02-02 DIAGNOSIS — I69920 Aphasia following unspecified cerebrovascular disease: Secondary | ICD-10-CM

## 2019-02-02 DIAGNOSIS — F329 Major depressive disorder, single episode, unspecified: Secondary | ICD-10-CM

## 2019-02-02 DIAGNOSIS — F32A Depression, unspecified: Secondary | ICD-10-CM

## 2019-02-02 NOTE — Progress Notes (Signed)
Patient: Johnathan Campbell Male    DOB: December 22, 1960   58 y.o.   MRN: [id number] Visit Date: 02/02/2019  Today's Provider: Wilhemena Durie, MD   Chief Complaint  Patient presents with  . Hyperlipidemia  . Depression   Subjective:     HPI   Lipid/Cholesterol, Follow-up:   Last seen for this6 months ago.  Management changes since that visit include no changes. . Last Lipid Panel:    Component Value Date/Time   CHOL 131 12/10/2017 0942   TRIG 40 12/10/2017 0942   HDL 56 12/10/2017 0942   CHOLHDL 2.3 12/10/2017 0942   CHOLHDL 2.1 08/11/2017 0852   LDLCALC 67 12/10/2017 0942   LDLCALC 63 08/11/2017 0852   He reports good compliance with treatment. He is not having side effects.  Current symptoms include none and have been stable. Weight trend: stable Prior visit with dietician: no Current diet: well balanced Current exercise: no regular exercise, but he does stay active.   Wt Readings from Last 3 Encounters:  02/02/19 180 lb (81.6 kg)  09/21/18 170 lb (77.1 kg)  09/19/18 174 lb 12.8 oz (79.3 kg)   Follow up, depression: Patient was last seen in the office 6 months ago. No changes were made in his medications. He is currently taking venlafaxine 75mg  twice daily, and reports good compliance and good symptom control.  Depression screen [facility] 2/9 02/02/2019 07/15/2018 12/07/2017  Decreased Interest 0 0 1  Down, Depressed, Hopeless 0 1 1  PHQ - 2 Score 0 1 2  Altered sleeping 0 - 1  Tired, decreased energy 0 - 0  Change in appetite 0 - 0  Feeling bad or failure about yourself  0 - 0  Trouble concentrating 0 - 0  Moving slowly or fidgety/restless 0 - 3  Suicidal thoughts 0 - 0  PHQ-9 Score 0 - 6  Difficult doing work/chores Not difficult at all - Not difficult at all     No Known Allergies   Current Outpatient Medications:  .  aspirin 325 MG tablet, Take 1 tablet by mouth every other day. , Disp: , Rfl:  .  atorvastatin (LIPITOR) 80 MG tablet, TAKE 1 TABLET BY  MOUTH EVERYDAY AT BEDTIME, Disp: 90 tablet, Rfl: 3 .  hydrocortisone (PROCTOZONE-HC) 2.5 % rectal cream, Place 1 application rectally 2 (two) times daily. (Patient taking differently: Place 1 application rectally 2 (two) times daily. ), Disp: 30 g, Rfl: 0 .  levETIRAcetam (KEPPRA) 500 MG tablet, Take 1 tablet (500 mg total) by mouth 2 (two) times daily., Disp: 60 tablet, Rfl: 2 .  montelukast (SINGULAIR) 10 MG tablet, TAKE 1 TABLET (10 MG TOTAL) BY MOUTH AT BEDTIME., Disp: 90 tablet, Rfl: 3 .  naproxen (NAPROSYN) 500 MG tablet, TAKE 1 TABLET (500 MG TOTAL) BY MOUTH 2 (TWO) TIMES DAILY WITH A MEAL. as needed, Disp: 180 tablet, Rfl: 3 .  venlafaxine (EFFEXOR) 75 MG tablet, TAKE 1 TABLET BY MOUTH TWICE A DAY, Disp: 180 tablet, Rfl: 3 .  cephALEXin (KEFLEX) 500 MG capsule, Take 1 capsule (500 mg total) by mouth 2 (two) times daily. (Patient not taking: Reported on 02/02/2019), Disp: 14 capsule, Rfl: 0  Review of Systems  Constitutional: Negative for activity change, appetite change, chills, diaphoresis, fatigue, fever and unexpected weight change.  Eyes: Negative.   Respiratory: Negative for cough and shortness of breath.   Cardiovascular: Negative for chest pain, palpitations and leg swelling.  Gastrointestinal: Negative.   Endocrine: Negative.  Genitourinary: Negative.   Musculoskeletal: Negative for arthralgias, back pain and myalgias.  Allergic/Immunologic: Negative.   Neurological: Negative for dizziness, light-headedness and headaches.  Psychiatric/Behavioral: Negative.     Social History   Tobacco Use  . Smoking status: Never Smoker  . Smokeless tobacco: Never Used  Substance Use Topics  . Alcohol use: Yes    Comment: socially- 1/2 drinks at a time      Objective:   BP 124/78 (BP Location: Left Arm, Patient Position: Sitting, Cuff Size: Normal)   Pulse 60   Temp 98.5 F (36.9 C)   Resp 16   Ht 5\' 8"  (1.727 m)   Wt 180 lb (81.6 kg)   SpO2 98%   BMI 27.37 kg/m  Vitals:    02/02/19 0830  BP: 124/78  Pulse: 60  Resp: 16  Temp: 98.5 F (36.9 C)  SpO2: 98%  Weight: 180 lb (81.6 kg)  Height: 5\' 8"  (1.727 m)     Physical Exam Vitals signs and nursing note reviewed.  Constitutional:      Appearance: He is well-developed.  HENT:     Head: Normocephalic and atraumatic.  Eyes:     General: No scleral icterus. Neck:     Musculoskeletal: Normal range of motion and neck supple.  Cardiovascular:     Rate and Rhythm: Normal rate and regular rhythm.     Heart sounds: Normal heart sounds.  Pulmonary:     Effort: Pulmonary effort is normal.     Breath sounds: Normal breath sounds.  Skin:    General: Skin is warm and dry.  Neurological:     Mental Status: He is alert and oriented to person, place, and time. Mental status is at baseline.  Psychiatric:        Mood and Affect: Mood normal.        Behavior: Behavior normal.        Thought Content: Thought content normal.        Judgment: Judgment normal.         Assessment & Plan    1. Hypercholesterolemia On statin. - Lipid panel - Comprehensive metabolic panel  2. Depression, unspecified depression type In  patial remission.  3. 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  4. Completed stroke ([place]) All riusk factors treated.     Richard Cranford Mon, MD  [facility]

## 2019-02-09 DIAGNOSIS — E7800 Pure hypercholesterolemia, unspecified: Secondary | ICD-10-CM | POA: Diagnosis not present

## 2019-02-09 DIAGNOSIS — I83811 Varicose veins of right lower extremities with pain: Secondary | ICD-10-CM | POA: Diagnosis not present

## 2019-02-10 ENCOUNTER — Telehealth: Payer: Self-pay

## 2019-02-10 LAB — COMPREHENSIVE METABOLIC PANEL
ALT: 25 IU/L (ref 0–44)
AST: 21 IU/L (ref 0–40)
Albumin/Globulin Ratio: 1.6 (ref 1.2–2.2)
Albumin: 4.1 g/dL (ref 3.8–4.9)
Alkaline Phosphatase: 79 IU/L (ref 39–117)
BUN/Creatinine Ratio: 18 (ref 9–20)
BUN: 17 mg/dL (ref 6–24)
Bilirubin Total: 0.9 mg/dL (ref 0.0–1.2)
CO2: 23 mmol/L (ref 20–29)
Calcium: 9.6 mg/dL (ref 8.7–10.2)
Chloride: 103 mmol/L (ref 96–106)
Creatinine, Ser: 0.96 mg/dL (ref 0.76–1.27)
GFR calc Af Amer: 100 mL/min/{1.73_m2} (ref >59)
GFR calc non Af Amer: 86 mL/min/{1.73_m2} (ref >59)
Globulin, Total: 2.5 g/dL (ref 1.5–4.5)
Glucose: 85 mg/dL (ref 65–99)
Potassium: 5.1 mmol/L (ref 3.5–5.2)
Sodium: 144 mmol/L (ref 134–144)
Total Protein: 6.6 g/dL (ref 6.0–8.5)

## 2019-02-10 LAB — LIPID PANEL
Chol/HDL Ratio: 2.2 ratio (ref 0.0–5.0)
Cholesterol, Total: 121 mg/dL (ref 100–199)
HDL: 54 mg/dL (ref >39)
LDL Calculated: 56 mg/dL (ref 0–99)
Triglycerides: 55 mg/dL (ref 0–149)
VLDL Cholesterol Cal: 11 mg/dL (ref 5–40)

## 2019-02-10 NOTE — Telephone Encounter (Signed)
Pt's mom advised.  (On DPR)  Thanks,   -Mickel Baas

## 2019-02-10 NOTE — Telephone Encounter (Signed)
-----   Message from Jerrol Banana., MD sent at 02/10/2019  8:45 AM EDT ----- Labs good.

## 2019-02-12 ENCOUNTER — Other Ambulatory Visit: Payer: Self-pay | Admitting: Family Medicine

## 2019-04-25 ENCOUNTER — Telehealth: Payer: Self-pay | Admitting: Family Medicine

## 2019-04-25 NOTE — Chronic Care Management (AMB) (Signed)
°  Chronic Care Management   Outreach Note  04/25/2019 Name: DEWEL LOTTER MRN: [id number] DOB: 29-Dec-1960  Referred by: Jerrol Banana., MD Reason for referral : No chief complaint on file.   An unsuccessful telephone outreach was attempted today. The patient was referred to the case management team by for assistance with chronic care management and care coordination.   Follow Up Plan: A HIPPA compliant phone message was left for the patient providing contact information and requesting a return call.  The care management team will reach out to the patient again over the next 7 days.  If patient returns call to provider office, please advise to call [place]  at [place]  [email or web address]   ??[phone]

## 2019-04-25 NOTE — Chronic Care Management (AMB) (Signed)
Chronic Care Management   Note  04/25/2019 Name: MUAD NOGA MRN: [id number] DOB: 26-Sep-1961  HONOR FAIRBANK is a 58 y.o. year old male who is a primary care patient of Jerrol Banana., MD. I reached out to Ambrose Mantle by phone today in response to a referral sent by Mr. Telesforo Brosnahan Sherrill's health plan.    Mr. Schaumburg was given information about Chronic Care Management services today including:  1. CCM service includes personalized support from designated clinical staff supervised by his physician, including individualized plan of care and coordination with other care providers 2. 24/7 contact phone numbers for assistance for urgent and routine care needs. 3. Service will only be billed when office clinical staff spend 20 minutes or more in a month to coordinate care. 4. Only one practitioner may furnish and bill the service in a calendar month. 5. The patient may stop CCM services at any time (effective at the end of the month) by phone call to the office staff. 6. The patient will be responsible for cost sharing (co-pay) of up to 20% of the service fee (after annual deductible is met).  Patient's mom Travus Oren agreed to services and verbal consent obtained.   Follow up plan: Telephone appointment with CCM team member scheduled for: 05/11/2019  [place]  [email or web address]   ??[phone]

## 2019-05-03 DIAGNOSIS — D3131 Benign neoplasm of right choroid: Secondary | ICD-10-CM | POA: Diagnosis not present

## 2019-05-07 DIAGNOSIS — L02212 Cutaneous abscess of back [any part, except buttock]: Secondary | ICD-10-CM | POA: Diagnosis not present

## 2019-05-08 DIAGNOSIS — I69320 Aphasia following cerebral infarction: Secondary | ICD-10-CM | POA: Diagnosis not present

## 2019-05-08 DIAGNOSIS — I69398 Other sequelae of cerebral infarction: Secondary | ICD-10-CM | POA: Diagnosis not present

## 2019-05-08 DIAGNOSIS — G40909 Epilepsy, unspecified, not intractable, without status epilepticus: Secondary | ICD-10-CM | POA: Diagnosis not present

## 2019-05-08 DIAGNOSIS — R9401 Abnormal electroencephalogram [EEG]: Secondary | ICD-10-CM | POA: Diagnosis not present

## 2019-05-11 ENCOUNTER — Other Ambulatory Visit: Payer: Self-pay

## 2019-05-11 ENCOUNTER — Ambulatory Visit (INDEPENDENT_AMBULATORY_CARE_PROVIDER_SITE_OTHER): Payer: PPO

## 2019-05-11 DIAGNOSIS — I69920 Aphasia following unspecified cerebrovascular disease: Secondary | ICD-10-CM

## 2019-05-11 DIAGNOSIS — E7800 Pure hypercholesterolemia, unspecified: Secondary | ICD-10-CM

## 2019-05-11 DIAGNOSIS — I639 Cerebral infarction, unspecified: Secondary | ICD-10-CM | POA: Diagnosis not present

## 2019-05-11 NOTE — Chronic Care Management (AMB) (Signed)
  Chronic Care Management   Initial Visit Note  05/11/2019 Name: Johnathan Campbell MRN: [id number] DOB: 03/18/1961  Subjective: "I want you to take to my mother about my health because I have aphasia"  Objective:  BP Readings from Last 3 Encounters:  02/02/19 124/78  09/21/18 107/70  09/19/18 120/80    Assessment: Johnathan Campbell is a 58 year old male patient of Dr. Miguel Aschoff, who was referred to the chronic case management team by his health plan. He was consented to services and today CCM RN CM spoke with patient and mother/caregiver about his health.  [name] has a history of stroke resulting in aphasia and depression. 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 He is able to complete all ADLs independently however his mother fixes his pill boxes, reminds him to take his medications, and provides meals as he forgets simple things such as closing the refrigerator and forgetting to turn off the stove. Mother says he drives but only locally. He exercises daily and because of mothers high cholesterol, she prepares heart healthy meals.  Review of patient status, including review of consultants reports, relevant laboratory and other test results, and collaboration with appropriate care team members and the patient's provider was performed as part of comprehensive patient evaluation and provision of chronic care management services.     Goals Addressed            This Visit's Progress   . per mother (caregiver) "I think we are doing well managing his health" (pt-stated)       Current Barriers:  . Cognitive Deficits  Nurse Case Manager Clinical Goal(s):  Marland Kitchen Over the next 30 days, patient/mother will contact CCM RN CM if additional needs or concerns arise  Interventions:  . Completed initial health assessment . Discussed recent specialist appointments . Assessed for CCM Pharmacy/Social work need . Provided mother/caregiver with CCM RN CM contact  information and encouraged her to call if needs or concerns arise  Patient Self Care Activities:  . Currently UNABLE TO independently: administer medications, prepare complex meals, present to medical appointments independently secondary to memory and speech deficit.  Initial goal documentation         Follow up plan:  After complete health assessment, no needs for ongoing chronic care management is identified. Patient is also engaged with [facility] care management and will receive home health assessment with  "Darrell" on 05/31/2019 per mother.  The patient/mother has been provided with contact information for the care management team and has been advised to call with any health related questions or concerns.    Johnathan Campbell E. Rollene Rotunda, RN, BSN Nurse Care Coordinator [facility] Care Management [phone]

## 2019-05-12 NOTE — Patient Instructions (Signed)
  Thank you allowing the Chronic Care Management Team to be a part of your care! It was a pleasure speaking with you today!  1. Please contact CCM RN CM if additional needs, questions, or concerns arise.  CCM (Chronic Care Management) Team   Trish Fountain RN, BSN Nurse Care Coordinator  [phone]  Ruben Reason PharmD  Clinical Pharmacist  [phone]   Elliot Gurney, LCSW Clinical Social Worker [phone]  Goals Addressed            This Visit's Progress   . COMPLETED: per mother (caregiver) "I think we are doing well managing his health" (pt-stated)       Current Barriers:  . Cognitive Deficits  Nurse Case Manager Clinical Goal(s):  Marland Kitchen Over the next 30 days, patient/mother will contact CCM RN CM if additional needs or concerns arise  Interventions:  . Completed initial health assessment . Discussed recent specialist appointments . Assessed for CCM Pharmacy/Social work need . Provided mother/caregiver with CCM RN CM contact information and encouraged her to call if needs or concerns arise  Patient Self Care Activities:  . Currently UNABLE TO independently: administer medications, prepare complex meals, present to medical appointments independently secondary to memory and speech deficit.  Initial goal documentation        The patient verbalized understanding of instructions provided today and declined a print copy of patient instruction materials.   The patient has been provided with contact information for the care management team and has been advised to call with any health related questions or concerns.   SYMPTOMS OF A STROKE   You have any symptoms of stroke. "BE FAST" is an easy way to remember the main warning signs: ? B - Balance. Signs are dizziness, sudden trouble walking, or loss of balance. ? E - Eyes. Signs are trouble seeing or a sudden change in how you see. ? F - Face. Signs are sudden weakness or loss of feeling of the face, or the face  or eyelid drooping on one side. ? A - Arms. Signs are weakness or loss of feeling in an arm. This happens suddenly and usually on one side of the body. ? S - Speech. Signs are sudden trouble speaking, slurred speech, or trouble understanding what people say. ? T - Time. Time to call emergency services. Write down what time symptoms started.  You have other signs of stroke, such as: ? A sudden, very bad headache with no known cause. ? Feeling sick to your stomach (nausea). ? Throwing up (vomiting). ? Jerky movements you cannot control (seizure).  SYMPTOMS OF A HEART ATTACK  What are the signs or symptoms? Symptoms of this condition include:  Chest pain. It may feel like: ? Crushing or squeezing. ? Tightness, pressure, fullness, or heaviness.  Pain in the arm, neck, jaw, back, or upper body.  Shortness of breath.  Heartburn.  Indigestion.  Nausea.  Cold sweats.  Feeling tired.  Sudden lightheadedness.

## 2019-06-13 DIAGNOSIS — H43811 Vitreous degeneration, right eye: Secondary | ICD-10-CM | POA: Diagnosis not present

## 2019-07-11 DIAGNOSIS — D2262 Melanocytic nevi of left upper limb, including shoulder: Secondary | ICD-10-CM | POA: Diagnosis not present

## 2019-07-11 DIAGNOSIS — X32XXXA Exposure to sunlight, initial encounter: Secondary | ICD-10-CM | POA: Diagnosis not present

## 2019-07-11 DIAGNOSIS — D225 Melanocytic nevi of trunk: Secondary | ICD-10-CM | POA: Diagnosis not present

## 2019-07-11 DIAGNOSIS — D2272 Melanocytic nevi of left lower limb, including hip: Secondary | ICD-10-CM | POA: Diagnosis not present

## 2019-07-11 DIAGNOSIS — L821 Other seborrheic keratosis: Secondary | ICD-10-CM | POA: Diagnosis not present

## 2019-07-11 DIAGNOSIS — D2261 Melanocytic nevi of right upper limb, including shoulder: Secondary | ICD-10-CM | POA: Diagnosis not present

## 2019-07-11 DIAGNOSIS — L570 Actinic keratosis: Secondary | ICD-10-CM | POA: Diagnosis not present

## 2019-07-11 DIAGNOSIS — D2271 Melanocytic nevi of right lower limb, including hip: Secondary | ICD-10-CM | POA: Diagnosis not present

## 2019-07-12 DIAGNOSIS — I69398 Other sequelae of cerebral infarction: Secondary | ICD-10-CM | POA: Diagnosis not present

## 2019-07-12 DIAGNOSIS — G40909 Epilepsy, unspecified, not intractable, without status epilepticus: Secondary | ICD-10-CM | POA: Diagnosis not present

## 2019-07-12 DIAGNOSIS — I69320 Aphasia following cerebral infarction: Secondary | ICD-10-CM | POA: Diagnosis not present

## 2019-07-12 DIAGNOSIS — E782 Mixed hyperlipidemia: Secondary | ICD-10-CM | POA: Diagnosis not present

## 2019-07-21 DIAGNOSIS — Z23 Encounter for immunization: Secondary | ICD-10-CM | POA: Diagnosis not present

## 2019-07-24 ENCOUNTER — Other Ambulatory Visit: Payer: Self-pay | Admitting: Family Medicine

## 2019-07-24 NOTE — Telephone Encounter (Signed)
Pt needing a refill on:  naproxen (NAPROSYN) 500 MG tablet  Please fill at:  [id number] [address], [place] (Phone) [phone] (Fax)   Thanks, [facility]

## 2019-07-25 MED ORDER — NAPROXEN 500 MG PO TBEC: 500.0000 mg | DELAYED_RELEASE_TABLET | Freq: Two times a day (BID) | ORAL | 3 refills | Status: AC | PRN

## 2019-08-08 ENCOUNTER — Other Ambulatory Visit: Payer: Self-pay

## 2019-08-08 ENCOUNTER — Ambulatory Visit (INDEPENDENT_AMBULATORY_CARE_PROVIDER_SITE_OTHER): Payer: PPO | Admitting: Family Medicine

## 2019-08-08 ENCOUNTER — Encounter: Payer: Self-pay | Admitting: Family Medicine

## 2019-08-08 VITALS — BP 122/84 | HR 65 | Temp 97.1°F | Resp 18 | Wt 175.6 lb

## 2019-08-08 DIAGNOSIS — I69920 Aphasia following unspecified cerebrovascular disease: Secondary | ICD-10-CM

## 2019-08-08 DIAGNOSIS — I639 Cerebral infarction, unspecified: Secondary | ICD-10-CM

## 2019-08-08 DIAGNOSIS — F329 Major depressive disorder, single episode, unspecified: Secondary | ICD-10-CM | POA: Diagnosis not present

## 2019-08-08 DIAGNOSIS — F32A Depression, unspecified: Secondary | ICD-10-CM

## 2019-08-08 DIAGNOSIS — E7800 Pure hypercholesterolemia, unspecified: Secondary | ICD-10-CM

## 2019-08-08 NOTE — Progress Notes (Signed)
Patient: Johnathan Campbell Male    DOB: May 27, 1961   58 y.o.   MRN: [id number] Visit Date: 08/08/2019  Today's Provider: Wilhemena Durie, MD   Chief Complaint  Patient presents with  . Hyperlipidemia   Subjective:     HPI  6 month follow up   No Known Allergies   Current Outpatient Medications:  .  aspirin EC 81 MG tablet, Take 81 mg by mouth. Take every other day, Disp: , Rfl:  .  atorvastatin (LIPITOR) 80 MG tablet, TAKE 1 TABLET BY MOUTH EVERYDAY AT BEDTIME, Disp: 90 tablet, Rfl: 3 .  hydrocortisone (PROCTOZONE-HC) 2.5 % rectal cream, Place 1 application rectally 2 (two) times daily. (Patient taking differently: Place 1 application rectally 2 (two) times daily. ), Disp: 30 g, Rfl: 0 .  levETIRAcetam (KEPPRA) 500 MG tablet, Take 1 tablet (500 mg total) by mouth 2 (two) times daily., Disp: 60 tablet, Rfl: 2 .  montelukast (SINGULAIR) 10 MG tablet, TAKE 1 TABLET BY MOUTH EVERYDAY AT BEDTIME, Disp: 90 tablet, Rfl: 3 .  naproxen (NAPROXEN DR) 500 MG EC tablet, Take 1 tablet (500 mg total) by mouth 2 (two) times daily as needed., Disp: 180 tablet, Rfl: 3 .  venlafaxine (EFFEXOR) 75 MG tablet, TAKE 1 TABLET BY MOUTH TWICE A DAY, Disp: 180 tablet, Rfl: 3  Review of Systems  Constitutional: Negative for activity change, appetite change, chills, diaphoresis, fatigue, fever and unexpected weight change.  Eyes: Negative.   Respiratory: Negative for cough and shortness of breath.   Cardiovascular: Negative for chest pain, palpitations and leg swelling.  Gastrointestinal: Negative.   Endocrine: Negative.   Genitourinary: Negative.   Musculoskeletal: Negative for arthralgias, back pain and myalgias.  Allergic/Immunologic: Negative.   Neurological: Negative for dizziness, light-headedness and headaches.  Psychiatric/Behavioral: Negative.     Social History   Tobacco Use  . Smoking status: Never Smoker  . Smokeless tobacco: Never Used  Substance Use Topics  . Alcohol use:  Yes    Comment: socially- 1/2 drinks at a time      Objective:   BP 122/84 (BP Location: Left Arm, Patient Position: Sitting, Cuff Size: Large)   Pulse 65   Temp (!) 97.1 F (36.2 C) (Temporal)   Resp 18   Wt 175 lb 9.6 oz (79.7 kg)   SpO2 97%   BMI 26.70 kg/m  Vitals:   08/08/19 0812  BP: 122/84  Pulse: 65  Resp: 18  Temp: (!) 97.1 F (36.2 C)  TempSrc: Temporal  SpO2: 97%  Weight: 175 lb 9.6 oz (79.7 kg)  Body mass index is 26.7 kg/m.   Physical Exam Vitals signs and nursing note reviewed.  Constitutional:      Appearance: He is well-developed.  HENT:     Head: Normocephalic and atraumatic.  Eyes:     General: No scleral icterus. Neck:     Musculoskeletal: Normal range of motion and neck supple.  Cardiovascular:     Rate and Rhythm: Normal rate and regular rhythm.     Heart sounds: Normal heart sounds.  Pulmonary:     Effort: Pulmonary effort is normal.     Breath sounds: Normal breath sounds.  Skin:    General: Skin is warm and dry.  Neurological:     Mental Status: He is alert and oriented to person, place, and time. Mental status is at baseline.  Psychiatric:        Mood and Affect: Mood normal.  Behavior: Behavior normal.        Thought Content: Thought content normal.        Judgment: Judgment normal.      No results found for any visits on 08/08/19.     Assessment & Plan    1. Completed stroke ([place])   2. Hypercholesterolemia Onlipitor.CPE 2021  3. Depression, unspecified depression type In partial remission.  4. 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      Cranford Mon, MD  [facility]

## 2019-08-25 DIAGNOSIS — H43811 Vitreous degeneration, right eye: Secondary | ICD-10-CM | POA: Diagnosis not present

## 2019-10-04 DIAGNOSIS — Z800 Family history of malignant neoplasm of digestive organs: Secondary | ICD-10-CM | POA: Diagnosis not present

## 2019-10-04 DIAGNOSIS — Z86010 Personal history of colonic polyps: Secondary | ICD-10-CM | POA: Diagnosis not present

## 2019-10-04 DIAGNOSIS — Z8371 Family history of colonic polyps: Secondary | ICD-10-CM | POA: Diagnosis not present

## 2019-10-16 DIAGNOSIS — Z20828 Contact with and (suspected) exposure to other viral communicable diseases: Secondary | ICD-10-CM | POA: Diagnosis not present

## 2019-10-18 DIAGNOSIS — Z03818 Encounter for observation for suspected exposure to other biological agents ruled out: Secondary | ICD-10-CM | POA: Diagnosis not present

## 2019-11-02 DIAGNOSIS — Z1159 Encounter for screening for other viral diseases: Secondary | ICD-10-CM | POA: Diagnosis not present

## 2019-11-06 DIAGNOSIS — K648 Other hemorrhoids: Secondary | ICD-10-CM | POA: Diagnosis not present

## 2019-11-06 DIAGNOSIS — D124 Benign neoplasm of descending colon: Secondary | ICD-10-CM | POA: Diagnosis not present

## 2019-11-06 DIAGNOSIS — K5730 Diverticulosis of large intestine without perforation or abscess without bleeding: Secondary | ICD-10-CM | POA: Diagnosis not present

## 2019-11-06 DIAGNOSIS — D120 Benign neoplasm of cecum: Secondary | ICD-10-CM | POA: Diagnosis not present

## 2019-11-06 DIAGNOSIS — Z86010 Personal history of colonic polyps: Secondary | ICD-10-CM | POA: Diagnosis not present

## 2019-11-08 DIAGNOSIS — D124 Benign neoplasm of descending colon: Secondary | ICD-10-CM | POA: Diagnosis not present

## 2019-11-08 DIAGNOSIS — D120 Benign neoplasm of cecum: Secondary | ICD-10-CM | POA: Diagnosis not present

## 2019-11-13 DIAGNOSIS — L820 Inflamed seborrheic keratosis: Secondary | ICD-10-CM | POA: Diagnosis not present

## 2019-11-13 DIAGNOSIS — L538 Other specified erythematous conditions: Secondary | ICD-10-CM | POA: Diagnosis not present

## 2019-11-13 DIAGNOSIS — L821 Other seborrheic keratosis: Secondary | ICD-10-CM | POA: Diagnosis not present

## 2019-11-14 DIAGNOSIS — G40909 Epilepsy, unspecified, not intractable, without status epilepticus: Secondary | ICD-10-CM | POA: Diagnosis not present

## 2019-11-14 DIAGNOSIS — I69398 Other sequelae of cerebral infarction: Secondary | ICD-10-CM | POA: Diagnosis not present

## 2019-11-14 DIAGNOSIS — I69320 Aphasia following cerebral infarction: Secondary | ICD-10-CM | POA: Diagnosis not present

## 2019-11-27 ENCOUNTER — Other Ambulatory Visit: Payer: Self-pay

## 2019-11-27 ENCOUNTER — Ambulatory Visit: Payer: PPO | Attending: Neurology | Admitting: Speech Pathology

## 2019-11-27 DIAGNOSIS — R4701 Aphasia: Secondary | ICD-10-CM

## 2019-11-28 ENCOUNTER — Other Ambulatory Visit: Payer: Self-pay

## 2019-11-28 ENCOUNTER — Encounter: Payer: Self-pay | Admitting: Speech Pathology

## 2019-11-28 NOTE — Therapy (Signed)
[place] MAIN [facility] SERVICES [address] [place], [place], [address] Phone: [phone]   Fax:  [phone]  Speech Language Pathology Evaluation  Patient Details  Name: Johnathan Campbell MRN: [id number] Date of Birth: 01-09-1961 Referring Provider (SLP): Dr. Manuella Ghazi   Encounter Date: 11/27/2019  End of Session - 11/28/19 1438    Visit Number  1    Number of Visits  12    Date for SLP Re-Evaluation  01/12/20    Authorization Type  Medicare    Authorization Time Period  Start 11/27/2019    Authorization - Visit Number  1    Authorization - Number of Visits  10    SLP Start Time  1400    SLP Stop Time   1500    SLP Time Calculation (min)  60 min    Activity Tolerance  Patient tolerated treatment well       Past Medical History:  Diagnosis Date  . Acute intractable headache    unspecified headache type  . Allergy   . Aphasia S/P CVA   . Clot    blood clot  . Completed stroke ([place])   . Hypercholesterolemia     Past Surgical History:  Procedure Laterality Date  . HERNIA REPAIR  twice    There were no vitals filed for this visit.      SLP Evaluation [facility] - 11/28/19 0001      SLP Visit Information   SLP Received On  11/27/19    Referring Provider (SLP)  Dr. 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 Patient history is significant for aphasia and seizures. Patient received SLP services in 2017 and has returned because he says his speech is "still not where he wants it to be". Patient has been attending teletherapy sessions with the [facility] ([facility]) due to COVID-19, however, he is searching for  individual therapy because he wants more intensive treatment.      Prior Functional Status   Cognitive/Linguistic Baseline  Within functional limits      Auditory Comprehension   Overall Auditory Comprehension  Impaired      Reading Comprehension   Reading Status  Impaired      Verbal Expression   Overall Verbal Expression  Impaired      Written Expression   Written Expression  Exceptions to [facility]      Oral Motor/Sensory Function   Overall Oral Motor/Sensory Function  Appears within functional limits for tasks assessed      Motor Speech   Overall Motor Speech  Appears within functional limits for tasks assessed      Standardized Assessments   Standardized Assessments   Western Aphasia Battery revised       Western Aphasia Battery - Revised Spontaneous Speech Information Content: 9/10 Fluency: 9/10 Auditory Verbal Comprehension Yes/No Questions: 54/60 Auditory Word Recognition: 52/60 Sequential Commands: 18/80 Repetition Repetition: 32/100 Naming Object Naming: 58/60 Word Fluency: 10/20 Sentence Completion: 10/10 Responsive Speech: 7/10 Aphasia Quotient: 71.8 Reading and Writing Reading (Task A + B): 52/60 Writing (not completed): 21/50    SLP  Education - 11/28/19 1437    Education Details  Plan of care    Person(s) Educated  Patient    Methods  Explanation    Comprehension  Verbalized understanding         SLP Long Term Goals - 11/28/19 1441      SLP LONG TERM GOAL #1   Title  Patient will generate grammatical and cogent sentence to complete abstract/comple linguistic task with 80% accuracy.    Time  6    Period  Weeks    Status  New    Target Date  01/12/20      SLP LONG TERM GOAL #2   Title  Patient will read aloud sentences and multi-syllabic words, maintaining phonemic accuracy, with 80% accuracy.      Time  6    Period  Weeks    Status  New    Target Date  01/12/20      SLP LONG TERM GOAL #3   Title  Patient will write grammatical and  cogent sentence to complete simple/basic linguistic task with 80% accuracy.    Time  6    Period  Weeks    Status  New    Target Date  01/12/20      SLP LONG TERM GOAL #4   Title  Patient will complete multi-unit processing tasks with 80% accuracy without the need of repetition of task instructions or significant delays in responding.    Time  6    Period  Weeks    Status  New    Target Date  01/12/20      SLP LONG TERM GOAL #5   Title  Patient will demonstrate reading comprehension for paragraphs with 80% accuracy.    Time  6    Period  Weeks    Status  New    Target Date  01/12/20       Plan - 11/28/19 1440    Clinical Impression Statement  At 6 years post CVA, this 59 year old man is presenting with mild-moderate aphasia characterized by fluent speech (word-finding errors, paraphasias, and circumlocutions), auditory comprehension deficits, and poor repetition skills.  The patient earned an Aphasia Quotient of 71.8/100, which is improved form testing in 2017 (Aphasia Quotient = 58.8/100).  The patient will benefit from skilled speech therapy for restorative and compensatory treatment of auditory comprehension and verbal expression, exploit written language skills to maximize communicative competence, and development of home exercise program to maintain expected gains.    Speech Therapy Frequency  2x / week    Duration  Other (comment)   6 weeks   Treatment/Interventions  Patient/family education;Language facilitation    Potential to Achieve Goals  Good    Potential Considerations  Ability to learn/carryover information;Family/community support;Previous level of function;Cooperation/participation level    Consulted and Agree with Plan of Care  Patient       Patient will benefit from skilled therapeutic intervention in order to improve the following deficits and impairments:   Aphasia - Plan: SLP plan of care cert/re-cert    Problem List Patient Active Problem List    Diagnosis Date Noted  . Depression 01/16/2016  . Cephalalgia 06/03/2015  . Problems influencing health status 06/03/2015  . 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 Angiopathy 06/03/2015  . Completed stroke ([place]) 06/03/2015  . Hypercholesterolemia 06/03/2015   [name], MS/CCC- SLP  Lou Miner 11/28/2019, 2:48 PM  [place] MAIN [facility] SERVICES  [place], [place], [address] Phone: [phone]   Fax:  [phone]  Name: Johnathan Campbell MRN: [id number] Date of Birth: 1960-12-06

## 2019-11-29 ENCOUNTER — Ambulatory Visit: Payer: PPO | Admitting: Speech Pathology

## 2019-12-04 ENCOUNTER — Ambulatory Visit: Payer: PPO | Attending: Neurology | Admitting: Speech Pathology

## 2019-12-04 ENCOUNTER — Other Ambulatory Visit: Payer: Self-pay

## 2019-12-04 ENCOUNTER — Encounter: Payer: Self-pay | Admitting: Speech Pathology

## 2019-12-04 DIAGNOSIS — R4701 Aphasia: Secondary | ICD-10-CM | POA: Insufficient documentation

## 2019-12-04 NOTE — Therapy (Signed)
[place] MAIN [facility] SERVICES [address] [place], [place], [address] Phone: [phone]   Fax:  [phone]  Speech Language Pathology Treatment  Patient Details  Name: Johnathan Campbell MRN: [id number] Date of Birth: Jul 16, 1961 Referring Provider (SLP): Dr. Manuella Ghazi   Encounter Date: 12/04/2019  End of Session - 12/04/19 1534    Visit Number  2    Number of Visits  12    Date for SLP Re-Evaluation  01/12/20    Authorization Type  Medicare    Authorization Time Period  Start 11/27/2019    Authorization - Visit Number  2    Authorization - Number of Visits  10    SLP Start Time  0205    SLP Stop Time   [id number]    SLP Time Calculation (min)  50 min    Activity Tolerance  Patient tolerated treatment well       Past Medical History:  Diagnosis Date  . Acute intractable headache    unspecified headache type  . Allergy   . Aphasia S/P CVA   . Clot    blood clot  . Completed stroke ([place])   . Hypercholesterolemia     Past Surgical History:  Procedure Laterality Date  . HERNIA REPAIR  twice    There were no vitals filed for this visit.  Subjective Assessment - 12/04/19 1532    Subjective  Patient was engaged and motivated thorughout the session.    Currently in Pain?  No/denies            ADULT SLP TREATMENT - 12/04/19 0001      General Information   Behavior/Cognition  Pleasant mood      Treatment Provided   Treatment provided  Cognitive-Linquistic      Pain Assessment   Pain Assessment  No/denies pain      Cognitive-Linquistic Treatment   Treatment focused on  Aphasia    Skilled Treatment   On a sequencing task, patient placed 4 cards in correct sequence with 90% accuracy. Patient self-corrected sequence on 2 attempts. Patient demonstrated pronoun reversal and mild disfluency on task. Patient sentences were incomplete, mostly consisting of nouns and lacking verbs (example: there's a boy, at bus stop, seems to have be  newspaper). On a functional reading task, patient answered numerically related questions with 90% accuracy given visual cues. Patient was asked to read aloud 2-step directions and manipulate items (f=10). Patient was 70% accurate on task. Patient increased to 100% accuracy when prompted to re-read the question. Patient errors included prepositional errors (under instead of above), pronoun errors (misunderstanding "give it to me"), and noun errors (mistaking tissue for paper and vice versa).       Assessment / Recommendations / Plan   Plan  Continue with current plan of care      Progression Toward Goals   Progression toward goals  Progressing toward goals       SLP Education - 12/04/19 1533    Education Details  Patient was educated on importance of slowing down reading in order to get all of the details.    Person(s) Educated  Patient    Methods  Explanation    Comprehension  Verbalized understanding         SLP Long Term Goals - 12/04/19 1540      SLP LONG TERM GOAL #1   Title  Patient will generate grammatical and cogent sentence to complete abstract/comple linguistic task with 80% accuracy.  Time  6    Period  Weeks    Status  New      SLP LONG TERM GOAL #2   Title  Patient will read aloud sentences and multi-syllabic words, maintaining phonemic accuracy, with 80% accuracy.      Time  6    Period  Weeks    Status  New      SLP LONG TERM GOAL #3   Title  Patient will write grammatical and cogent sentence to complete simple/basic linguistic task with 80% accuracy.    Time  6    Period  Weeks    Status  New      SLP LONG TERM GOAL #4   Title  Patient will complete multi-unit processing tasks with 80% accuracy without the need of repetition of task instructions or significant delays in responding.    Time  6    Period  Weeks    Status  New      SLP LONG TERM GOAL #5   Title  Patient will demonstrate reading comprehension for paragraphs with 80% accuracy.    Time  6     Period  Weeks    Status  New       Plan - 12/04/19 1538    Clinical Impression Statement  Patient presents with significant auditory comprehension deficits and reading difficulty. Although patient often can understand the material he has read, when reading aloud he misses words or reverses them. Patient responds well to prompts to slow down his speech or re-read material. Patient misunderstood some tasks today but understands facial expression and body language well, and also responds well to prompts to listen again, re-read materials, or to slow down on task. Patient admits rushing through things, which often leads to errors.    Speech Therapy Frequency  2x / week    Treatment/Interventions  Patient/family education;Language facilitation    Potential to Achieve Goals  Good    Potential Considerations  Ability to learn/carryover information;Family/community support;Previous level of function;Cooperation/participation level    Consulted and Agree with Plan of Care  Patient       Patient will benefit from skilled therapeutic intervention in order to improve the following deficits and impairments:   Aphasia    Problem List Patient Active Problem List   Diagnosis Date Noted  . Depression 01/16/2016  . Cephalalgia 06/03/2015  . Problems influencing health status 06/03/2015  . 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 Angiopathy 06/03/2015  . Completed stroke ([place]) 06/03/2015  . Hypercholesterolemia 06/03/2015    Shirl Weir 12/04/2019, 3:46 PM  [place] MAIN [facility] SERVICES [address] [place], [place], [address] Phone: [phone]   Fax:  [phone]   Name: Johnathan Campbell MRN: [id number] Date of Birth: 09-Feb-1961

## 2019-12-06 ENCOUNTER — Encounter: Payer: PPO | Admitting: Speech Pathology

## 2019-12-07 ENCOUNTER — Other Ambulatory Visit: Payer: Self-pay

## 2019-12-07 ENCOUNTER — Ambulatory Visit: Payer: PPO | Admitting: Speech Pathology

## 2019-12-07 DIAGNOSIS — R4701 Aphasia: Secondary | ICD-10-CM

## 2019-12-08 ENCOUNTER — Encounter: Payer: Self-pay | Admitting: Speech Pathology

## 2019-12-08 NOTE — Therapy (Signed)
[place] MAIN [facility] SERVICES [address] [place], [place], [address] Phone: [phone]   Fax:  [phone]  Speech Language Pathology Treatment  Patient Details  Name: Johnathan Campbell MRN: [id number] Date of Birth: Oct 12, 1961 Referring Provider (SLP): Dr. Manuella Ghazi   Encounter Date: 12/07/2019  End of Session - 12/08/19 1650    Visit Number  3    Number of Visits  12    Date for SLP Re-Evaluation  01/12/20    Authorization Type  Medicare    Authorization Time Period  Start 11/27/2019    Authorization - Visit Number  3    Authorization - Number of Visits  10    SLP Start Time  1400    SLP Stop Time   1450    SLP Time Calculation (min)  50 min    Activity Tolerance  Patient tolerated treatment well       Past Medical History:  Diagnosis Date  . Acute intractable headache    unspecified headache type  . Allergy   . Aphasia S/P CVA   . Clot    blood clot  . Completed stroke ([place])   . Hypercholesterolemia     Past Surgical History:  Procedure Laterality Date  . HERNIA REPAIR  twice    There were no vitals filed for this visit.  Subjective Assessment - 12/08/19 1650    Subjective  Patient was engaged and motivated thorughout the session.            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 Patient history is significant for aphasia and seizures. Patient received SLP services in 2017 and has returned because he says his speech is "still not where he wants it to be". Patient has been attending teletherapy sessions with the [facility] ([facility]) due to COVID-19, however, he is searching for individual therapy because he wants more intensive treatment.       Treatment Provided   Treatment provided   Cognitive-Linquistic      Pain Assessment   Pain Assessment  No/denies pain      Cognitive-Linquistic Treatment   Treatment focused on  Aphasia    Skilled Treatment  VERBAL EXPRESSION: Patient is not able to generate a simple, cogent sentence given a simple action picture.  He generates fluent but circumlocutionary word finding attempts with no grammatic structure.  When constrained to state only the verb being pictured, the patient is 70% accurate.  READING FLUENCY: Patient able to read increasing length sentences with overall 80% fluency, much greater than he can read a long sentence without the increasing length.  READING COMPLEHENSION: Patient demonstrates functional reading at 80% accuracy, sentence comprehension at 80%, and paragraph comprehension at 70% accuracy.       Assessment / Recommendations / Plan   Plan  Continue with current plan of care      Progression Toward Goals   Progression toward goals  Progressing toward goals       SLP Education - 12/08/19 1650    Education Details  Slow down to improve verbal output    Person(s) Educated  Patient    Methods  Explanation    Comprehension  Verbalized understanding         SLP Long  Term Goals - 12/04/19 1540      SLP LONG TERM GOAL #1   Title  Patient will generate grammatical and cogent sentence to complete abstract/comple linguistic task with 80% accuracy.    Time  6    Period  Weeks    Status  New      SLP LONG TERM GOAL #2   Title  Patient will read aloud sentences and multi-syllabic words, maintaining phonemic accuracy, with 80% accuracy.      Time  6    Period  Weeks    Status  New      SLP LONG TERM GOAL #3   Title  Patient will write grammatical and cogent sentence to complete simple/basic linguistic task with 80% accuracy.    Time  6    Period  Weeks    Status  New      SLP LONG TERM GOAL #4   Title  Patient will complete multi-unit processing tasks with 80% accuracy without the need of repetition of  task instructions or significant delays in responding.    Time  6    Period  Weeks    Status  New      SLP LONG TERM GOAL #5   Title  Patient will demonstrate reading comprehension for paragraphs with 80% accuracy.    Time  6    Period  Weeks    Status  New       Plan - 12/08/19 1651    Clinical Impression Statement  Patient presents with severe auditory comprehension deficits and reading difficulty. Although patient often can understand the material he has read, when reading aloud he misses words or reverses them. Patient responds well to prompts to slow down his speech or re-read material. Patient misunderstood some tasks today but understands facial expression and body language well, and also responds well to prompts to listen again, re-read materials, or to slow down on task. Patient admits rushing through things, which often leads to errors.    Speech Therapy Frequency  2x / week    Duration  Other (comment)    Treatment/Interventions  Patient/family education;Language facilitation    Potential to Achieve Goals  Good    Potential Considerations  Ability to learn/carryover information;Family/community support;Previous level of function;Cooperation/participation level    Consulted and Agree with Plan of Care  Patient       Patient will benefit from skilled therapeutic intervention in order to improve the following deficits and impairments:   Aphasia    Problem List Patient Active Problem List   Diagnosis Date Noted  . Depression 01/16/2016  . Cephalalgia 06/03/2015  . Problems influencing health status 06/03/2015  . 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 Angiopathy 06/03/2015  . Completed stroke ([place]) 06/03/2015  . Hypercholesterolemia 06/03/2015   [name], MS/CCC- SLP  Lou Miner 12/08/2019, 4:52 PM  [place] MAIN [facility] SERVICES [address] [place], [place], [address] Phone:  [phone]   Fax:  [phone]   Name: Johnathan Campbell MRN: [id number] Date of Birth: [birth year]

## 2019-12-11 ENCOUNTER — Other Ambulatory Visit: Payer: Self-pay

## 2019-12-11 ENCOUNTER — Ambulatory Visit: Payer: PPO | Admitting: Speech Pathology

## 2019-12-11 ENCOUNTER — Encounter: Payer: Self-pay | Admitting: Speech Pathology

## 2019-12-11 DIAGNOSIS — R4701 Aphasia: Secondary | ICD-10-CM | POA: Diagnosis not present

## 2019-12-11 NOTE — Therapy (Signed)
[place] MAIN [facility] SERVICES [address] [place], [place], [address] Phone: [phone]   Fax:  [phone]  Speech Language Pathology Treatment  Patient Details  Name: Johnathan Campbell MRN: [id number] Date of Birth: [birth year] Referring Provider (SLP): Dr. Manuella Ghazi   Encounter Date: 12/11/2019  End of Session - 12/11/19 1640    Visit Number  4    Number of Visits  12    Date for SLP Re-Evaluation  01/12/20    Authorization Type  Medicare    Authorization Time Period  Start 11/27/2019    Authorization - Visit Number  4    Authorization - Number of Visits  10    SLP Start Time  0300    SLP Stop Time   0350    SLP Time Calculation (min)  50 min    Activity Tolerance  Patient tolerated treatment well       Past Medical History:  Diagnosis Date  . Acute intractable headache    unspecified headache type  . Allergy   . Aphasia S/P CVA   . Clot    blood clot  . Completed stroke ([place])   . Hypercholesterolemia     Past Surgical History:  Procedure Laterality Date  . HERNIA REPAIR  twice    There were no vitals filed for this visit.  Subjective Assessment - 12/11/19 1639    Subjective  Patient was engaged and motivated thorughout the session.    Currently in Pain?  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 Patient history is significant for aphasia and seizures. Patient received SLP services in 2017 and has returned because he says his speech is "still not where he wants it to be". Patient has been attending teletherapy sessions with the [facility] ([facility]) due to COVID-19, however, he is searching for individual therapy because he wants more intensive treatment.       Treatment  Provided   Treatment provided  Cognitive-Linquistic      Cognitive-Linquistic Treatment   Treatment focused on  Aphasia    Skilled Treatment  Patient was 60% accurate on reading fluency of simple wh- question sentences. Patient noted to read quickly and skip over words or utilize incorrect function words. Patient was 100% accurate in comprehension of sentences, but was not able to generate cogent, grammatical sentence structures to answer the questions. Patient noted to shorten words "[place]" for [place], "Gerrianne Scale" for [place] Patient utilizes circumlocutionary word finding attempts when unable to generate target word. Patient demonstrated difficulty wiith reading words of increased length (i.e.: equipment, activities, birthday). Patient was asked to read various short paragraphs and answer questions about them. Patient was 80% accurate in reading questions aloud, 100% accurate in comprehension. Patient was unable to generate a cogent summary of the paragraphs after answering questions about the paragraphs and cited inferential humor located within the text.      Assessment / Recommendations / Plan   Plan  Continue with current plan of care      Progression Toward Goals   Progression toward goals  Progressing toward goals       SLP Education - 12/11/19 1639    Education Details  Slow  down to improve verbal output    Person(s) Educated  Patient    Methods  Explanation    Comprehension  Verbalized understanding         SLP Long Term Goals - 12/04/19 1540      SLP LONG TERM GOAL #1   Title  Patient will generate grammatical and cogent sentence to complete abstract/comple linguistic task with 80% accuracy.    Time  6    Period  Weeks    Status  New      SLP LONG TERM GOAL #2   Title  Patient will read aloud sentences and multi-syllabic words, maintaining phonemic accuracy, with 80% accuracy.      Time  6    Period  Weeks    Status  New      SLP LONG TERM GOAL #3   Title   Patient will write grammatical and cogent sentence to complete simple/basic linguistic task with 80% accuracy.    Time  6    Period  Weeks    Status  New      SLP LONG TERM GOAL #4   Title  Patient will complete multi-unit processing tasks with 80% accuracy without the need of repetition of task instructions or significant delays in responding.    Time  6    Period  Weeks    Status  New      SLP LONG TERM GOAL #5   Title  Patient will demonstrate reading comprehension for paragraphs with 80% accuracy.    Time  6    Period  Weeks    Status  New       Plan - 12/11/19 1640    Clinical Impression Statement  Patient presents with severe auditory comprehension deficits and reading difficulty. Although patient often can understand the material he has read, when reading aloud he misses words or reverses them. Patient responds well to prompts to slow down his speech or re-read material. Patient misunderstood some tasks today but understands facial expression and body language well, and also responds well to prompts to listen again, re-read materials, or to slow down on task. Patient admits rushing through things, which often leads to errors. Patient becomes confused on tasks that involve pragmatics/humor.    Speech Therapy Frequency  2x / week    Duration  Other (comment)    Treatment/Interventions  Patient/family education;Language facilitation    Potential to Achieve Goals  Good    Potential Considerations  Ability to learn/carryover information;Family/community support;Previous level of function;Cooperation/participation level    Consulted and Agree with Plan of Care  Patient       Patient will benefit from skilled therapeutic intervention in order to improve the following deficits and impairments:   Aphasia    Problem List Patient Active Problem List   Diagnosis Date Noted  . Depression 01/16/2016  . Cephalalgia 06/03/2015  . Problems influencing health status 06/03/2015  .  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 Angiopathy 06/03/2015  . Completed stroke ([place]) 06/03/2015  . Hypercholesterolemia 06/03/2015    Alisa Stjames 12/11/2019, 4:44 PM  [place] MAIN [facility] SERVICES [address] [place], [place], [address] Phone: [phone]   Fax:  [phone]   Name: OAKLIN VIVIANI MRN: [id number] Date of Birth: Aug 11, 1961

## 2019-12-13 ENCOUNTER — Ambulatory Visit: Payer: PPO | Admitting: Speech Pathology

## 2019-12-13 ENCOUNTER — Other Ambulatory Visit: Payer: Self-pay

## 2019-12-13 DIAGNOSIS — R4701 Aphasia: Secondary | ICD-10-CM

## 2019-12-14 ENCOUNTER — Encounter: Payer: Self-pay | Admitting: Speech Pathology

## 2019-12-14 NOTE — Therapy (Signed)
[place] MAIN [facility] SERVICES [address] [place], [place], [address] Phone: [phone]   Fax:  [phone]  Speech Language Pathology Treatment  Patient Details  Name: Johnathan Campbell MRN: [id number] Date of Birth: 1961/01/01 Referring Provider (SLP): Dr. Manuella Ghazi   Encounter Date: 12/13/2019  End of Session - 12/14/19 1105    Visit Number  5    Number of Visits  12    Date for SLP Re-Evaluation  01/12/20    Authorization Type  Medicare    Authorization Time Period  Start 11/27/2019    Authorization - Visit Number  5    Authorization - Number of Visits  10    SLP Start Time  0200    SLP Stop Time   0250    SLP Time Calculation (min)  50 min    Activity Tolerance  Patient tolerated treatment well       Past Medical History:  Diagnosis Date  . Acute intractable headache    unspecified headache type  . Allergy   . Aphasia S/P CVA   . Clot    blood clot  . Completed stroke ([place])   . Hypercholesterolemia     Past Surgical History:  Procedure Laterality Date  . HERNIA REPAIR  twice    There were no vitals filed for this visit.  Subjective Assessment - 12/14/19 1105    Subjective  Patient was engaged and motivated thorughout the session.    Currently in Pain?  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 Patient history is significant for aphasia and seizures. Patient received SLP services in 2017 and has returned because he says his speech is "still not where he wants it to be". Patient has been attending teletherapy sessions with the [facility] ([facility]) due to COVID-19, however, he is searching for individual therapy because he wants more intensive treatment.       Treatment  Provided   Treatment provided  Cognitive-Linquistic      Pain Assessment   Pain Assessment  No/denies pain      Cognitive-Linquistic Treatment   Treatment focused on  Aphasia    Skilled Treatment Patient was asked to complete two tasks involving figurative language. Reading fluency, comprehension and writing were observed. Patient was able to read 70% of sentences with some level of fluency. Disfluencies mostly involved word-finding difficulty (I.e. trying to produce the correct pronunciation of the word as rapidly  as possible). Patient wrote 100% of single-word answers spelled correctly with a visual cue. Patient demonstrated comprehension of questions and could answer with 100% accuracy. However, when prompted to elaborate on why his answer was correct on the humor task, patient was unable to give cogent, grammatical sentences justifying his answer. Patient was also asked the appropriate context for using abstract figurative language. Although patient could describe what the figurative language, he needed clinician cues for coming up with examples of use.      Assessment / Recommendations / Plan   Plan  Continue with current plan of care      Progression Toward Goals   Progression toward goals  Progressing toward goals       SLP Education -  12/14/19 1105    Education Details  figurative language    Person(s) Educated  Patient    Methods  Explanation    Comprehension  Verbalized understanding         SLP Long Term Goals - 12/04/19 1540      SLP LONG TERM GOAL #1   Title  Patient will generate grammatical and cogent sentence to complete abstract/comple linguistic task with 80% accuracy.    Time  6    Period  Weeks    Status  New      SLP LONG TERM GOAL #2   Title  Patient will read aloud sentences and multi-syllabic words, maintaining phonemic accuracy, with 80% accuracy.      Time  6    Period  Weeks    Status  New      SLP LONG TERM GOAL #3   Title  Patient will write  grammatical and cogent sentence to complete simple/basic linguistic task with 80% accuracy.    Time  6    Period  Weeks    Status  New      SLP LONG TERM GOAL #4   Title  Patient will complete multi-unit processing tasks with 80% accuracy without the need of repetition of task instructions or significant delays in responding.    Time  6    Period  Weeks    Status  New      SLP LONG TERM GOAL #5   Title  Patient will demonstrate reading comprehension for paragraphs with 80% accuracy.    Time  6    Period  Weeks    Status  New       Plan - 12/14/19 1106    Clinical Impression Statement  Patient presents with severe auditory comprehension deficits and reading difficulty. Although patient often can understand the material he has read, when reading aloud he misses words or reverses them. Patient responds well to prompts to slow down his speech or re-read material. Patient misunderstood some tasks today but understands facial expression and body language well, and also responds well to prompts to listen again, re-read materials, or to slow down. on task. Patient admits rushing through things, which often leads to errors. Patient demonstrated an improvement in reading fluency, but it is unclear if he truly comprehends. Patient is often unable to justify his answers and gives non-cogent answers or changes the subject. (ex: "when you make a banana", "sharp teeth on there...you don't put the teeth of a saw in your mouth", "sounds like sicking", "conflated two things whether he know it or not"). Patient also demonstrates difficulty with pronunciation of longer words (I.e. "material", "[place]").   Speech Therapy Frequency  2x / week    Duration  Other (comment)    Treatment/Interventions  Patient/family education;Language facilitation    Potential to Achieve Goals  Good    Potential Considerations  Ability to learn/carryover information;Family/community support;Previous level of  function;Cooperation/participation level    Consulted and Agree with Plan of Care  Patient       Patient will benefit from skilled therapeutic intervention in order to improve the following deficits and impairments:   Aphasia    Problem List Patient Active Problem List   Diagnosis Date Noted  . Depression 01/16/2016  . Cephalalgia 06/03/2015  . Problems influencing health status 06/03/2015  . 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 Angiopathy 06/03/2015  . Completed stroke ([place]) 06/03/2015  . Hypercholesterolemia 06/03/2015  Nili Honda 12/14/2019, 11:07 AM  [place] MAIN [facility] SERVICES [address] [place], [place], [address] Phone: [phone]   Fax:  [phone]   Name: Johnathan Campbell MRN: [id number] Date of Birth: [birth year]

## 2019-12-18 ENCOUNTER — Encounter: Payer: PPO | Admitting: Speech Pathology

## 2019-12-20 ENCOUNTER — Encounter: Payer: Self-pay | Admitting: Speech Pathology

## 2019-12-20 ENCOUNTER — Ambulatory Visit: Payer: PPO | Admitting: Speech Pathology

## 2019-12-20 ENCOUNTER — Other Ambulatory Visit: Payer: Self-pay

## 2019-12-20 DIAGNOSIS — R4701 Aphasia: Secondary | ICD-10-CM | POA: Diagnosis not present

## 2019-12-20 NOTE — Therapy (Signed)
[place] MAIN [facility] SERVICES [address] [place], [place], [address] Phone: [phone]   Fax:  [phone]  Speech Language Pathology Treatment  Patient Details  Name: Johnathan Campbell MRN: [id number] Date of Birth: 27-Jun-1961 Referring Provider (SLP): Dr. Manuella Ghazi   Encounter Date: 12/20/2019  End of Session - 12/20/19 1126    Visit Number  6    Number of Visits  12    Date for SLP Re-Evaluation  01/12/20    Authorization Type  Medicare    Authorization Time Period  Start 11/27/2019    Authorization - Visit Number  6    Authorization - Number of Visits  10    SLP Start Time  1000    SLP Stop Time   1050    SLP Time Calculation (min)  50 min    Activity Tolerance  Patient tolerated treatment well       Past Medical History:  Diagnosis Date  . Acute intractable headache    unspecified headache type  . Allergy   . Aphasia S/P CVA   . Clot    blood clot  . Completed stroke ([place])   . Hypercholesterolemia     Past Surgical History:  Procedure Laterality Date  . HERNIA REPAIR  twice    There were no vitals filed for this visit.  Subjective Assessment - 12/20/19 1119    Subjective  Patient was engaged and motivated thorughout the session.    Currently in Pain?  No/denies            ADULT SLP TREATMENT - 12/20/19 0001      General Information   Behavior/Cognition  Alert;Cooperative;Pleasant mood    HPI  Patient is a 59 year old male who suffered an ischemic left MCA infarct with involvement of both temporal and parietal lobes on 05/15/2013. Patient history is significant for aphasia and seizures. Patient received SLP services in 2017 and has returned because he says his speech is "still not where he wants it to be". Patient has been attending teletherapy sessions with the [facility] ([facility]) due to COVID-19, however, he is searching for individual therapy because he wants more intensive treatment.       Treatment  Provided   Treatment provided  Cognitive-Linquistic      Pain Assessment   Pain Assessment  No/denies pain      Cognitive-Linquistic Treatment   Treatment focused on  Aphasia    Skilled Treatment  Patient was 60% accurate on reading fluency of simple wh- question sentences. Patient noted to read quickly and skip over words or utilize incorrect function words. Patient was 100% accurate in correcting scrambled sentences, but 70% accurate in reading them fluently. Patient expressed a desire to improve his numerical skills, so most of his session was spent talking about historical dates, etc. Patient was 50% accurate on a writing task involving listening for numbers and writing them. Patient demonstrated perseveration on mistakes. Patient was able to write years out with 50% accuracy, although he was often aware of the year and was able to write it or say it with 80% accuracy given cues from the clinician. Patient demonstrated significant word-spelling difficulty with words such as "[place]", "[place]", "marathon", but was able to write and say each word with 100% accuracy given clinician cues by the end of the session. Patient is very stimulable given cues to slow down and to write/draw the word.     Assessment / Recommendations / Plan  Plan  Continue with current plan of care      Progression Toward Goals   Progression toward goals  Progressing toward goals       SLP Education - 12/20/19 1122    Education Details  reading/writing numbers    Person(s) Educated  Patient    Methods  Explanation    Comprehension  Verbalized understanding         SLP Long Term Goals - 12/04/19 1540      SLP LONG TERM GOAL #1   Title  Patient will generate grammatical and cogent sentence to complete abstract/comple linguistic task with 80% accuracy.    Time  6    Period  Weeks    Status  New      SLP LONG TERM GOAL #2   Title  Patient will read aloud sentences and multi-syllabic words, maintaining  phonemic accuracy, with 80% accuracy.      Time  6    Period  Weeks    Status  New      SLP LONG TERM GOAL #3   Title  Patient will write grammatical and cogent sentence to complete simple/basic linguistic task with 80% accuracy.    Time  6    Period  Weeks    Status  New      SLP LONG TERM GOAL #4   Title  Patient will complete multi-unit processing tasks with 80% accuracy without the need of repetition of task instructions or significant delays in responding.    Time  6    Period  Weeks    Status  New      SLP LONG TERM GOAL #5   Title  Patient will demonstrate reading comprehension for paragraphs with 80% accuracy.    Time  6    Period  Weeks    Status  New       Plan - 12/20/19 1127    Clinical Impression Statement  Patient presents with severe auditory comprehension deficits and reading difficulty. Although patient often can understand the material he has read, when reading aloud he misses words or reverses them. Patient responds well to prompts to slow down his speech or re-read material. Patient continues to utilize circumlocution: "fat blood" for target "cholesterol" and "60s war" for "[place]". Patient is able to communicate effectively using drawing and writing, although he often has spelling errors.Patient utilizes many strategies for avoiding communication breakdown, including some maladaptive strategies such as avoiding certain words or changing the topic. However, patient responds remarkably well to prompts to re-try words or phrases.    Speech Therapy Frequency  2x / week    Duration  Other (comment)    Treatment/Interventions  Patient/family education;Language facilitation    Potential to Achieve Goals  Good    Potential Considerations  Ability to learn/carryover information;Family/community support;Previous level of function;Cooperation/participation level    Consulted and Agree with Plan of Care  Patient       Patient will benefit from skilled therapeutic  intervention in order to improve the following deficits and impairments:   Aphasia    Problem List Patient Active Problem List   Diagnosis Date Noted  . Depression 01/16/2016  . Cephalalgia 06/03/2015  . Problems influencing health status 06/03/2015  . 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 Angiopathy 06/03/2015  . Completed stroke ([place]) 06/03/2015  . Hypercholesterolemia 06/03/2015    Rathana Viveros 12/20/2019, 11:27 AM  [place] [facility] [address]  [place], [place], [address] Phone: [phone]   Fax:  [phone]   Name: Johnathan Campbell MRN: [id number] Date of Birth: [birth year]

## 2019-12-22 ENCOUNTER — Ambulatory Visit: Payer: PPO | Admitting: Speech Pathology

## 2019-12-22 ENCOUNTER — Other Ambulatory Visit: Payer: Self-pay

## 2019-12-22 ENCOUNTER — Encounter: Payer: Self-pay | Admitting: Speech Pathology

## 2019-12-22 DIAGNOSIS — R4701 Aphasia: Secondary | ICD-10-CM

## 2019-12-22 NOTE — Therapy (Signed)
[place] MAIN [facility] SERVICES [address] [place], [place], [address] Phone: [phone]   Fax:  [phone]  Speech Language Pathology Treatment  Patient Details  Name: Johnathan Campbell MRN: [id number] Date of Birth: 1961/04/16 Referring Provider (SLP): Dr. Manuella Ghazi   Encounter Date: 12/22/2019  End of Session - 12/22/19 1136    Visit Number  7    Number of Visits  12    Date for SLP Re-Evaluation  01/12/20    Authorization Type  Medicare    Authorization Time Period  Start 11/27/2019    Authorization - Visit Number  7    Authorization - Number of Visits  10    SLP Start Time  0900    SLP Stop Time   0950    SLP Time Calculation (min)  50 min    Activity Tolerance  Patient tolerated treatment well       Past Medical History:  Diagnosis Date  . Acute intractable headache    unspecified headache type  . Allergy   . Aphasia S/P CVA   . Clot    blood clot  . Completed stroke ([place])   . Hypercholesterolemia     Past Surgical History:  Procedure Laterality Date  . HERNIA REPAIR  twice    There were no vitals filed for this visit.  Subjective Assessment - 12/22/19 1136    Subjective  Patient was engaged and motivated thorughout the session.    Currently in Pain?  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 Patient history is significant for aphasia and seizures. Patient received SLP services in 2017 and has returned because he says his speech is "still not where he wants it to be". Patient has been attending teletherapy sessions with the [facility] ([facility]) due to COVID-19, however, he is searching for individual therapy because he wants more intensive treatment.       Treatment  Provided   Treatment provided  Cognitive-Linquistic      Pain Assessment   Pain Assessment  No/denies pain      Cognitive-Linquistic Treatment   Treatment focused on  Aphasia    Skilled Treatment Patient was prompted to write down years when presented auditorily. Patient was 70% accurate in verbalizing the target year, but 0% in written expression of same target. Given mod clinician cues, patient was able to write target year. Patient wrote target words throughout the task, but often abandoned the task when unable to write it correctly and needed clinician cues to rewrite it. Patient need maximal clinician cues to write words of over 3 syllables, and demonstrates telescoping in his speech and his written language ("present" for target "president", "telgraph" for target "telegraph", "contental" for target "continental","[place]" for target "Valvano"). Patient was unable to say the year his children were born, but able to write the year, then read it. Patient able to say and write the year(s) he graduate high school and college. Patient was eager to share novel information today and described in detail a study he participated in, the impact Donetta Potts had on [place], and [place] history.      Assessment / Recommendations / Plan   Plan  Continue with current plan of care      Progression Toward Goals   Progression toward goals  Progressing toward goals       SLP Education - 12/22/19 1136    Education Details  reading and writing numbers    Person(s) Educated  Patient    Methods  Explanation    Comprehension  Verbalized understanding         SLP Long Term Goals - 12/04/19 1540      SLP LONG TERM GOAL #1   Title  Patient will generate grammatical and cogent sentence to complete abstract/comple linguistic task with 80% accuracy.    Time  6    Period  Weeks    Status  New      SLP LONG TERM GOAL #2   Title  Patient will read aloud sentences and multi-syllabic words, maintaining phonemic  accuracy, with 80% accuracy.      Time  6    Period  Weeks    Status  New      SLP LONG TERM GOAL #3   Title  Patient will write grammatical and cogent sentence to complete simple/basic linguistic task with 80% accuracy.    Time  6    Period  Weeks    Status  New      SLP LONG TERM GOAL #4   Title  Patient will complete multi-unit processing tasks with 80% accuracy without the need of repetition of task instructions or significant delays in responding.    Time  6    Period  Weeks    Status  New      SLP LONG TERM GOAL #5   Title  Patient will demonstrate reading comprehension for paragraphs with 80% accuracy.    Time  6    Period  Weeks    Status  New       Plan - 12/22/19 1137    Clinical Impression Statement Patient asked for words longer than 3 syllables to be broken down phonetically by syllable. When prompted to read syllables one at a time, patient is able to produce target words, but is unable to recreate without visual prompts. Patient responded well to this new method of reading/writing intervention. Patient demonstrated more self-correction on tasks involving writing numbers. If he wrote a year incorrectly, he was able to circle the correct year. Patient benefits from immediate feedback from the clinician and demonstrated new strategies to help him focus. Patient also drew items and pulled up pictures of items on his phone to help resolve communication breakdown.   Speech Therapy Frequency  2x / week    Duration  Other (comment)    Treatment/Interventions  Patient/family education;Language facilitation    Potential to Achieve Goals  Good    Potential Considerations  Ability to learn/carryover information;Family/community support;Previous level of function;Cooperation/participation level    Consulted and Agree with Plan of Care  Patient       Patient will benefit from skilled therapeutic intervention in order to improve the following deficits and impairments:    Aphasia    Problem List Patient Active Problem List   Diagnosis Date Noted  . Depression 01/16/2016  . Cephalalgia 06/03/2015  . Problems influencing health status 06/03/2015  . 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 Angiopathy 06/03/2015  . Completed stroke ([place]) 06/03/2015  . Hypercholesterolemia 06/03/2015    Allizon Woznick 12/22/2019, 11:37 AM  [address] MAIN [facility] SERVICES [place]  [place], [place], [address] Phone: [phone]   Fax:  [phone]   Name: Johnathan Campbell MRN: [id number] Date of Birth: 1961-08-07

## 2019-12-25 ENCOUNTER — Ambulatory Visit: Payer: PPO | Admitting: Speech Pathology

## 2019-12-25 ENCOUNTER — Encounter: Payer: Self-pay | Admitting: Speech Pathology

## 2019-12-25 ENCOUNTER — Other Ambulatory Visit: Payer: Self-pay

## 2019-12-25 DIAGNOSIS — R4701 Aphasia: Secondary | ICD-10-CM

## 2019-12-25 NOTE — Therapy (Signed)
[place] MAIN [facility] SERVICES [address] [place], [place], [address] Phone: [phone]   Fax:  [phone]  Speech Language Pathology Treatment  Patient Details  Name: Johnathan Campbell MRN: [id number] Date of Birth: 09/25/1961 Referring Provider (SLP): Dr. Manuella Ghazi   Encounter Date: 12/25/2019  End of Session - 12/25/19 1308    Visit Number  8    Number of Visits  12    Date for SLP Re-Evaluation  01/12/20    Authorization Type  Medicare    Authorization Time Period  Start 11/27/2019    Authorization - Visit Number  8    Authorization - Number of Visits  10    SLP Start Time  1000    SLP Stop Time   1050    SLP Time Calculation (min)  50 min    Activity Tolerance  Patient tolerated treatment well       Past Medical History:  Diagnosis Date  . Acute intractable headache    unspecified headache type  . Allergy   . Aphasia S/P CVA   . Clot    blood clot  . Completed stroke ([place])   . Hypercholesterolemia     Past Surgical History:  Procedure Laterality Date  . HERNIA REPAIR  twice    There were no vitals filed for this visit.  Subjective Assessment - 12/25/19 1307    Subjective  Patient was engaged and motivated thorughout the session.    Currently in Pain?  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 Patient history is significant for aphasia and seizures. Patient received SLP services in 2017 and has returned because he says his speech is "still not where he wants it to be". Patient has been attending teletherapy sessions with the [facility] ([facility]) due to COVID-19, however, he is searching for individual therapy because he wants more intensive treatment.       Treatment  Provided   Treatment provided  Cognitive-Linquistic      Pain Assessment   Pain Assessment  No/denies pain      Cognitive-Linquistic Treatment   Treatment focused on  Aphasia    Skilled Treatment Patient was asked questions requiring functional use of numbers (addresses, phone numbers, dates, times, etc.). Patient was able to answer questions with 90% accuracy. Given an opportunity to write numbers, patient was able to write his phone number and his mother's phone number with 100% accuracy. When asked to write today's date, patient was 100% accurate independently. When patient was asked to write the date from one year ago, patient wrote Nov 03, 2019, Dec 05, 2019, then was able to write the target date (December 24, 2018). This error is thought to be due to auditory comprehension deficits. Patient was prompted to write down words he could not generate (millenial, croissant) and prompted to rewrite them, and repeat them. When asked yes/no questions auditorily, patient was 90% accurate. When asked to discriminate between two minimal pairs, patient was unable to do so without maximal verbal and visual cues from the clinician. Again, it is unclear if the patient understood the instructions.      Assessment / Recommendations / Plan   Plan  Continue with current plan of care      Progression Toward Goals   Progression toward goals  Progressing toward goals       SLP Education - 12/25/19 1307    Education Details  auditory discrimination    Person(s) Educated  Patient    Methods  Explanation    Comprehension  Verbalized understanding         SLP Long Term Goals - 12/04/19 1540      SLP LONG TERM GOAL #1   Title  Patient will generate grammatical and cogent sentence to complete abstract/comple linguistic task with 80% accuracy.    Time  6    Period  Weeks    Status  New      SLP LONG TERM GOAL #2   Title  Patient will read aloud sentences and multi-syllabic words, maintaining phonemic  accuracy, with 80% accuracy.      Time  6    Period  Weeks    Status  New      SLP LONG TERM GOAL #3   Title  Patient will write grammatical and cogent sentence to complete simple/basic linguistic task with 80% accuracy.    Time  6    Period  Weeks    Status  New      SLP LONG TERM GOAL #4   Title  Patient will complete multi-unit processing tasks with 80% accuracy without the need of repetition of task instructions or significant delays in responding.    Time  6    Period  Weeks    Status  New      SLP LONG TERM GOAL #5   Title  Patient will demonstrate reading comprehension for paragraphs with 80% accuracy.    Time  6    Period  Weeks    Status  New       Plan - 12/25/19 1308    Clinical Impression Statement Patient is able to "pretend" that he understands things, although he often is unclear about the directions or goals of task because he is unable to understand. Patient prefers in-person meetings because he is able to read people's body language and act accordingly. Patient benefits from reminders to speak slowly. Patient also shares that one-on-one conversations are much easier for him because he is able to focus. Patient was briefed on strategies for communicating and encouraged to slow down.    Speech Therapy Frequency  2x / week    Duration  Other (comment)    Treatment/Interventions  Patient/family education;Language facilitation    Potential to Achieve Goals  Good    Potential Considerations  Ability to learn/carryover information;Family/community support;Previous level of function;Cooperation/participation level    Consulted and Agree with Plan of Care  Patient       Patient will benefit from skilled therapeutic intervention in order to improve the following deficits and impairments:   Aphasia    Problem List Patient Active Problem List   Diagnosis Date Noted  . Depression 01/16/2016  . Cephalalgia 06/03/2015  . Problems influencing health status 06/03/2015   . 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 Angiopathy 06/03/2015  . Completed stroke ([place]) 06/03/2015  . Hypercholesterolemia 06/03/2015    Johnathan Campbell 12/25/2019, 1:09 PM  [place] MAIN [facility] SERVICES [address] [place], [place], [address] Phone: [phone]   Fax:  [phone]   Name: Johnathan Campbell MRN: [id number] Date of Birth: 03-21-1961

## 2019-12-27 ENCOUNTER — Ambulatory Visit: Payer: PPO | Admitting: Speech Pathology

## 2019-12-27 ENCOUNTER — Encounter: Payer: Self-pay | Admitting: Speech Pathology

## 2019-12-27 ENCOUNTER — Other Ambulatory Visit: Payer: Self-pay

## 2019-12-27 DIAGNOSIS — R4701 Aphasia: Secondary | ICD-10-CM | POA: Diagnosis not present

## 2019-12-27 NOTE — Therapy (Signed)
[place] MAIN [facility] SERVICES [address] [place], [place], [address] Phone: [phone]   Fax:  [phone]  Speech Language Pathology Treatment  Patient Details  Name: Johnathan Campbell MRN: [id number] Date of Birth: July 23, 1961 Referring Provider (SLP): Dr. Manuella Ghazi   Encounter Date: 12/27/2019  End of Session - 12/27/19 1129    Visit Number  9    Number of Visits  12    Date for SLP Re-Evaluation  01/12/20    Authorization Type  Medicare    Authorization Time Period  Start 11/27/2019    Authorization - Visit Number  9    Authorization - Number of Visits  10    SLP Start Time  1000    SLP Stop Time   1050    SLP Time Calculation (min)  50 min    Activity Tolerance  Patient tolerated treatment well       Past Medical History:  Diagnosis Date  . Acute intractable headache    unspecified headache type  . Allergy   . Aphasia S/P CVA   . Clot    blood clot  . Completed stroke ([place])   . Hypercholesterolemia     Past Surgical History:  Procedure Laterality Date  . HERNIA REPAIR  twice    There were no vitals filed for this visit.  Subjective Assessment - 12/27/19 1129    Subjective  Patient was engaged and motivated thorughout the session.            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 Patient history is significant for aphasia and seizures. Patient received SLP services in 2017 and has returned because he says his speech is "still not where he wants it to be". Patient has been attending teletherapy sessions with the [facility] ([facility]) due to COVID-19, however, he is searching for individual therapy because he wants more intensive treatment.       Treatment Provided   Treatment provided   Cognitive-Linquistic      Pain Assessment   Pain Assessment  No/denies pain      Cognitive-Linquistic Treatment   Treatment focused on  Aphasia    Skilled Treatment  Patient was read pairs of sentences containing minimal pairs and asked to identify corresponding pictures(f=2). Patient was 80% accurate on the task. Patient was then asked to read pairs of sentences and then elaborate on what they mean. Patient reading fluency was 100%, patient comprehension was 90%. Patient was then read 20 of the previously used sentences and asked to describe what they meant. Patient was 60% accurate in description when presented with sentences auditorily. Given clinician cues, patient increased to 75% accuracy.      Assessment / Recommendations / Plan   Plan  Continue with current plan of care      Progression Toward Goals   Progression toward goals  Progressing toward goals       SLP Education - 12/27/19 1129    Education Details  auditory discrimination    Person(s) Educated  Patient    Methods  Explanation    Comprehension  Verbalized understanding         SLP Long Term Goals - 12/04/19 1540      SLP LONG TERM GOAL #  1   Title  Patient will generate grammatical and cogent sentence to complete abstract/comple linguistic task with 80% accuracy.    Time  6    Period  Weeks    Status  New      SLP LONG TERM GOAL #2   Title  Patient will read aloud sentences and multi-syllabic words, maintaining phonemic accuracy, with 80% accuracy.      Time  6    Period  Weeks    Status  New      SLP LONG TERM GOAL #3   Title  Patient will write grammatical and cogent sentence to complete simple/basic linguistic task with 80% accuracy.    Time  6    Period  Weeks    Status  New      SLP LONG TERM GOAL #4   Title  Patient will complete multi-unit processing tasks with 80% accuracy without the need of repetition of task instructions or significant delays in responding.    Time  6    Period  Weeks     Status  New      SLP LONG TERM GOAL #5   Title  Patient will demonstrate reading comprehension for paragraphs with 80% accuracy.    Time  6    Period  Weeks    Status  New       Plan - 12/27/19 1130    Clinical Impression Statement  Patient attended well to the task today and was able to describe with some level of cogency why each sentence was different. Patient continues to present with incorrect verb usage within spontaneous speech. However, patient is noted to have slowed down his speech and is independently utilizing strategies for complex word-finding.   Speech Therapy Frequency  2x / week    Duration  Other (comment)    Treatment/Interventions  Patient/family education;Language facilitation    Potential to Achieve Goals  Good    Potential Considerations  Ability to learn/carryover information;Family/community support;Previous level of function;Cooperation/participation level    Consulted and Agree with Plan of Care  Patient       Patient will benefit from skilled therapeutic intervention in order to improve the following deficits and impairments:   Aphasia    Problem List Patient Active Problem List   Diagnosis Date Noted  . Depression 01/16/2016  . Cephalalgia 06/03/2015  . Problems influencing health status 06/03/2015  . 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 Angiopathy 06/03/2015  . Completed stroke ([place]) 06/03/2015  . Hypercholesterolemia 06/03/2015    Johnathan Campbell 12/27/2019, 11:32 AM  [place] MAIN [facility] SERVICES [address] [place], [place], [address] Phone: [phone]   Fax:  [phone]   Name: Johnathan Campbell MRN: [id number] Date of Birth: 06-27-1961

## 2020-01-01 ENCOUNTER — Other Ambulatory Visit: Payer: Self-pay

## 2020-01-01 ENCOUNTER — Ambulatory Visit: Payer: PPO | Attending: Neurology | Admitting: Speech Pathology

## 2020-01-01 ENCOUNTER — Encounter: Payer: Self-pay | Admitting: Speech Pathology

## 2020-01-01 DIAGNOSIS — R4701 Aphasia: Secondary | ICD-10-CM | POA: Diagnosis not present

## 2020-01-01 NOTE — Therapy (Signed)
[place] MAIN [facility] SERVICES [address] [place], [place], [address] Phone: [phone]   Fax:  [phone]  Speech Language Pathology Treatment/Progress Summary  Speech Therapy Progress Note   Dates of reporting period  11/27/2019   to   01/01/2020   Patient Details  Name: Johnathan Campbell MRN: [id number] Date of Birth: 10-13-1961 Referring Provider (SLP): Dr. Manuella Ghazi   Encounter Date: 01/01/2020  End of Session - 01/01/20 1233    Visit Number  10    Number of Visits  12    Date for SLP Re-Evaluation  01/12/20    Authorization Type  Medicare    Authorization Time Period  Start 11/27/2019    Authorization - Visit Number  10    Authorization - Number of Visits  10    SLP Start Time  1000    SLP Stop Time   1050    SLP Time Calculation (min)  50 min    Activity Tolerance  Patient tolerated treatment well       Past Medical History:  Diagnosis Date  . Acute intractable headache    unspecified headache type  . Allergy   . Aphasia S/P CVA   . Clot    blood clot  . Completed stroke ([place])   . Hypercholesterolemia     Past Surgical History:  Procedure Laterality Date  . HERNIA REPAIR  twice    There were no vitals filed for this visit.  Subjective Assessment - 01/01/20 1231    Subjective  Patient was engaged and motivated thorughout the session.    Currently in Pain?  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 Patient history is significant for aphasia and seizures. Patient received SLP services in 2017 and has returned because he says his speech is "still not where he wants it to be". Patient has been attending teletherapy sessions with the [facility] ([facility]) due to COVID-19,  however, he is searching for individual therapy because he wants more intensive treatment.       Treatment Provided   Treatment provided  Cognitive-Linquistic      Pain Assessment   Pain Assessment  No/denies pain      Cognitive-Linquistic Treatment   Treatment focused on  Aphasia    Skilled Treatment  Patient was read pairs of sentences containing minimal pairs. He was able to repeat 25% immediately without re-listening to the model .Patient often repeated a variation of the sentence (I.e. "they are living" for target "they are alive", "collect them tests" for target "collect the exams"). Given additional models (average of 3-4)  and some written cues, patient was 100% in repetition. Patient was 100% accurate in auditory comprehension and cogently explained what each sentence meant with 100% accuracy.Patient read 10 groups of two sentences containing minimal pairs and he was 90% accurate on the task. Given 12 sets of 2 minimal pairs and asked to read them, patient read them with 90% accuracy. He missed "vast", "vendor" and "phase". When asked to reread the words and explain what they meant, patient was able to do so with all except for "vast", stating, "I think I know what that meant a long time ago."  Assessment / Recommendations / Plan   Plan  Continue with current plan of care      Progression Toward Goals   Progression toward goals  Progressing toward goals       SLP Education - 01/01/20 1232    Education Details  auditory discrimination    Person(s) Educated  Patient    Methods  Explanation    Comprehension  Verbalized understanding         SLP Long Term Goals - 01/01/20 1330      SLP LONG TERM GOAL #1   Title  Patient will generate grammatical and cogent sentence to complete abstract/comple linguistic task with 80% accuracy.    Status  Partially Met    Target Date  01/12/20      SLP LONG TERM GOAL #2   Title  Patient will read aloud sentences and multi-syllabic words,  maintaining phonemic accuracy, with 80% accuracy.      Status  Partially Met    Target Date  01/05/20      SLP LONG TERM GOAL #3   Title  Patient will write grammatical and cogent sentence to complete simple/basic linguistic task with 80% accuracy.    Status  Partially Met    Target Date  01/12/20      SLP LONG TERM GOAL #4   Title  Patient will complete multi-unit processing tasks with 80% accuracy without the need of repetition of task instructions or significant delays in responding.    Status  Partially Met    Target Date  01/12/20      SLP LONG TERM GOAL #5   Title  Patient will demonstrate reading comprehension for paragraphs with 80% accuracy.    Status  Partially Met    Target Date  01/12/20       Plan - 01/01/20 1233    Clinical Impression Statement  Patient attended well to task and reports he has been spending time with some friends, playing games such as "What Do You Meme?" Patient reports trying to guess less during his interactions and responds well to slowed speech and writing to resolve communication breakdown.    Speech Therapy Frequency  2x / week    Duration  Other (comment)    Treatment/Interventions  Patient/family education;Language facilitation    Potential to Achieve Goals  Good    Potential Considerations  Ability to learn/carryover information;Family/community support;Previous level of function;Cooperation/participation level    Consulted and Agree with Plan of Care  Patient       Patient will benefit from skilled therapeutic intervention in order to improve the following deficits and impairments:   Aphasia    Problem List Patient Active Problem List   Diagnosis Date Noted  . Depression 01/16/2016  . Cephalalgia 06/03/2015  . Problems influencing health status 06/03/2015  . 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 Angiopathy 06/03/2015  . Completed stroke ([place]) 06/03/2015  . Hypercholesterolemia 06/03/2015     Lou Miner 01/01/2020, 1:33 PM  [place] MAIN [facility] SERVICES [address] [place], [place], [address] Phone: [phone]   Fax:  [phone]   Name: Johnathan Campbell MRN: [id number] Date of Birth: 06-20-1961

## 2020-01-02 IMAGING — CT CT HEAD W/O CM
3 series · 16 of 47 positions shown, 19 images · non-contrast
Comparison: MRI 05/19/2016

CLINICAL DATA: Altered level of consciousness.

EXAM:
CT HEAD WITHOUT CONTRAST
TECHNIQUE: Contiguous axial images were obtained from the base of the skull
through the vertex without intravenous contrast.

[Series 2: head wo · axial · 0.44mm/px · z∈[-134,-9]mm · 10 of 31 slices shown, 13 images]
[im 3/31  brain]
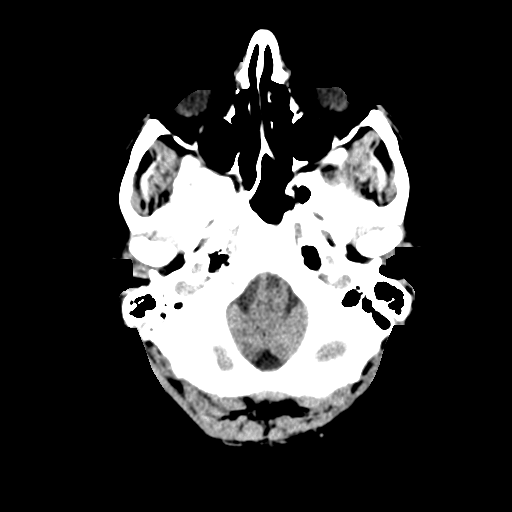
[im 3/31  bone]
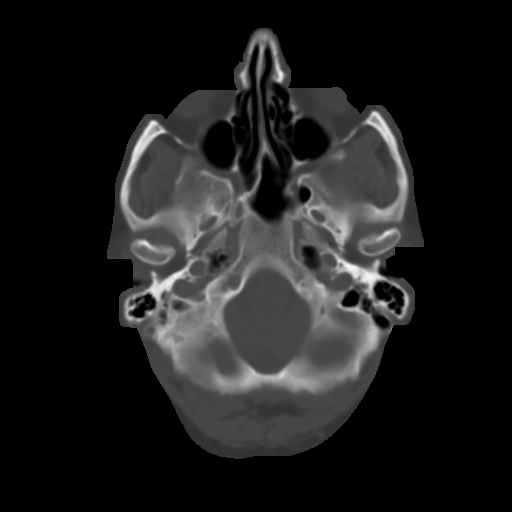
[im 6/31  brain]
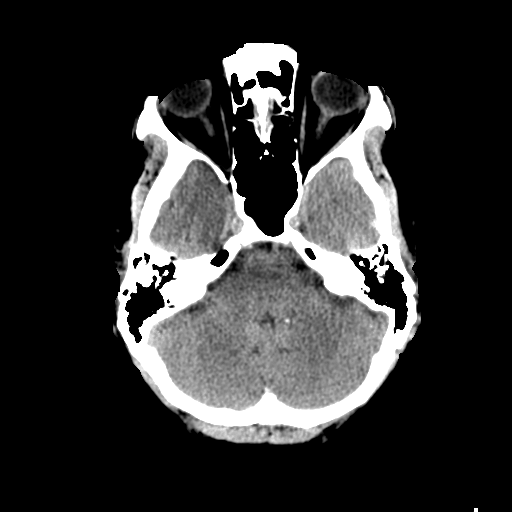
[im 9/31  brain]
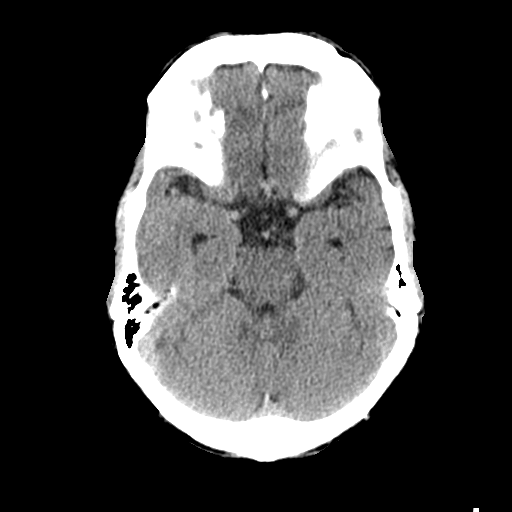
[im 11/31  brain]
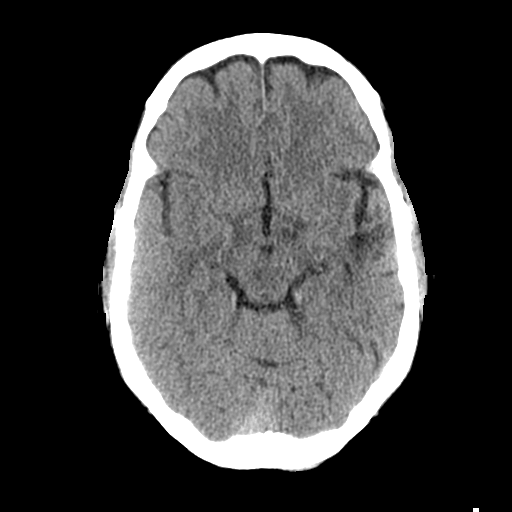
[im 14/31  brain]
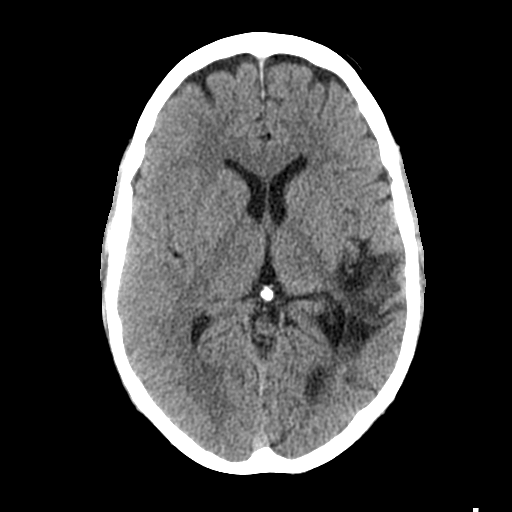
[im 14/31  bone]
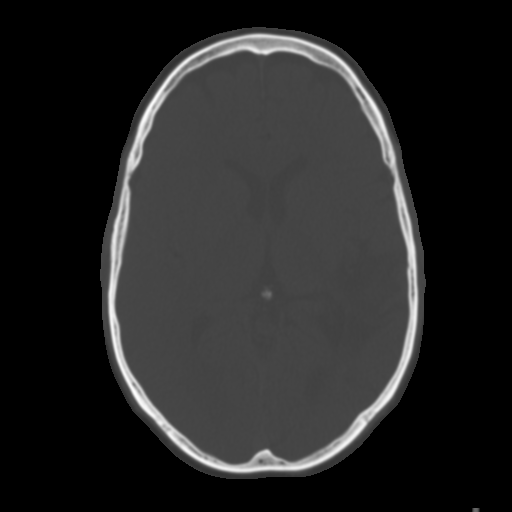
[im 17/31  brain]
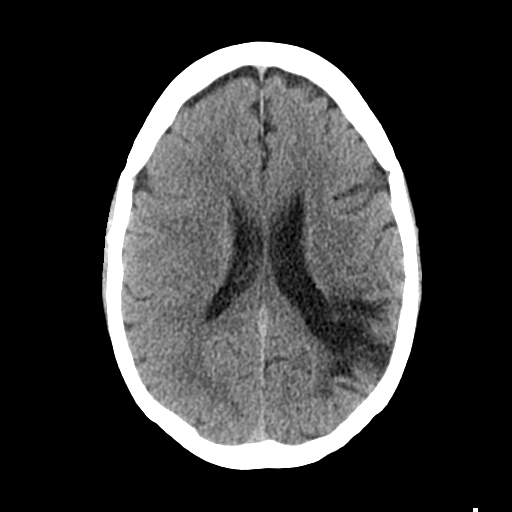
[im 20/31  brain]
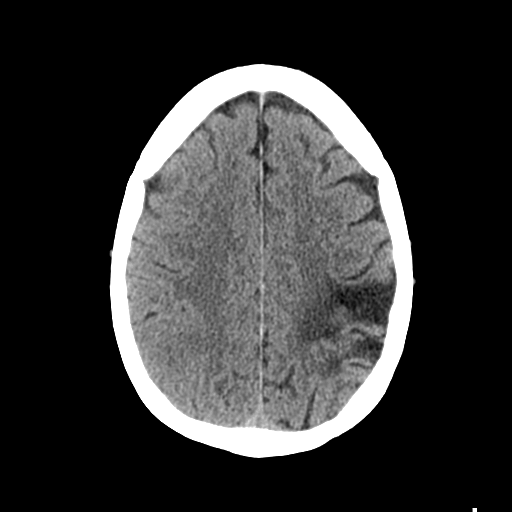
[im 23/31  brain]
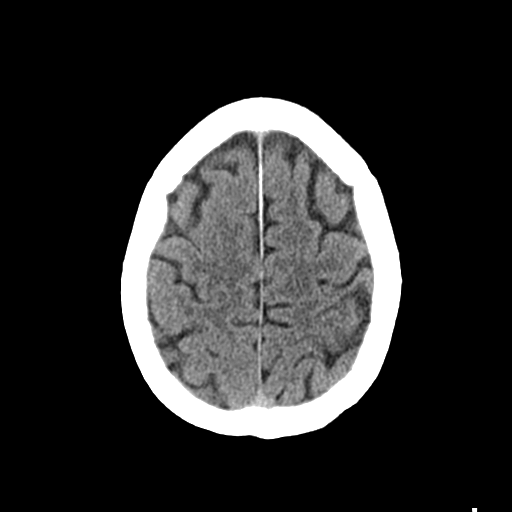
[im 25/31  brain]
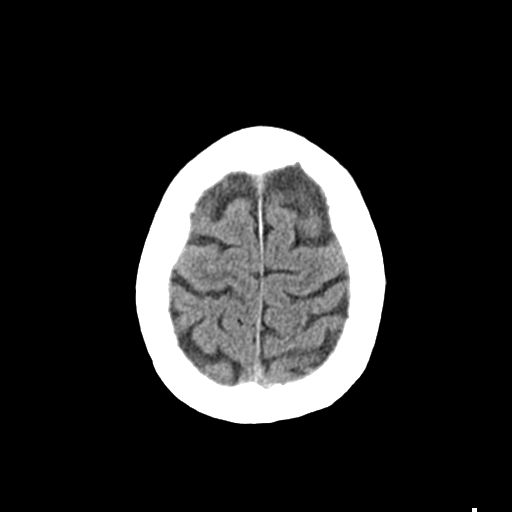
[im 25/31  bone]
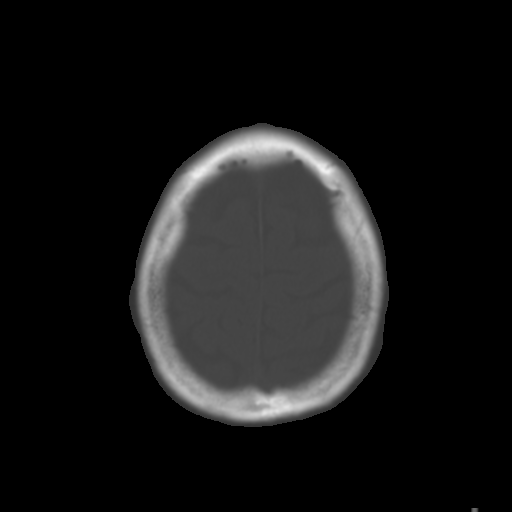
[im 28/31  brain]
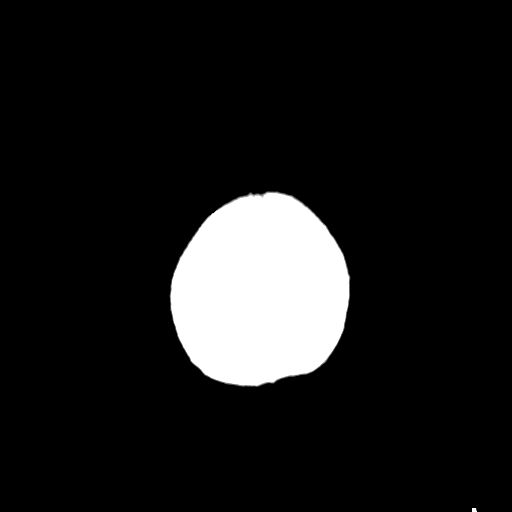

[Series 4: coronal soft tissue · coronal · 0.32mm/px · 3 of 69 slices shown]
[im 23/69  brain]
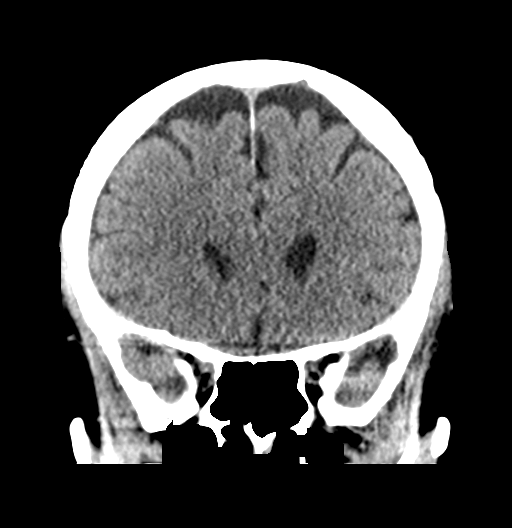
[im 31/69  brain]
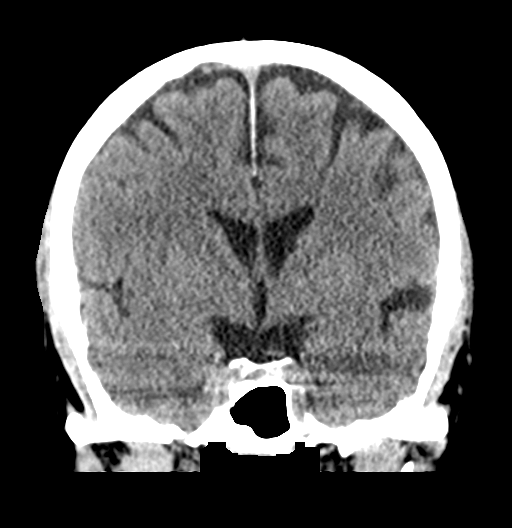
[im 38/69  brain]
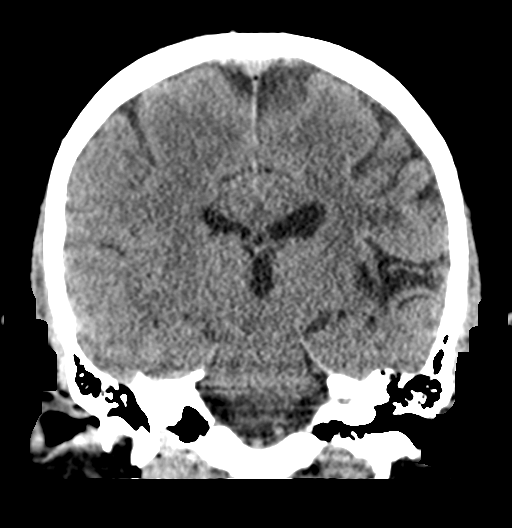

[Series 5: sagittal soft tissue · sagittal · 0.33mm/px · 3 of 52 slices shown]
[im 18/52  brain]
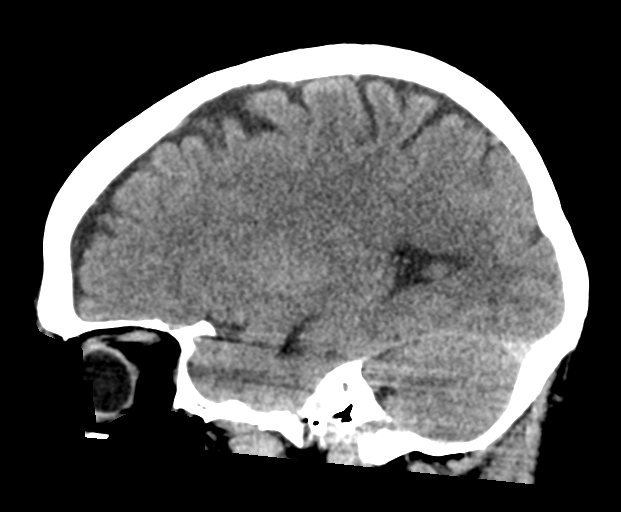
[im 26/52  brain]
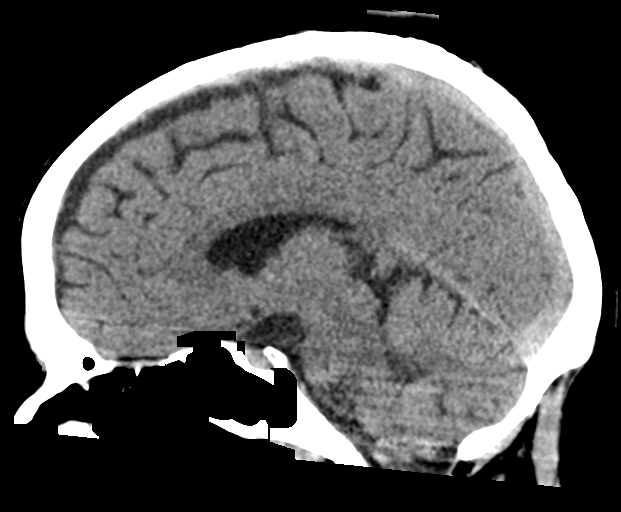
[im 35/52  brain]
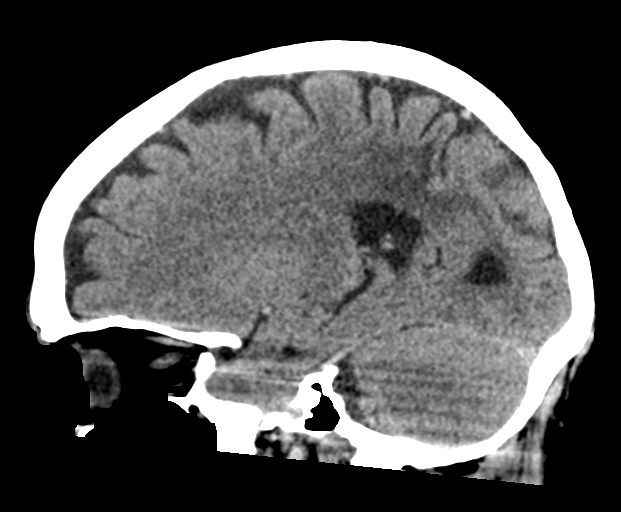

[16 of 47 positions shown; findings below may reference images not displayed]

FINDINGS: Brain: Area of encephalomalacia and old infarct in the left temporal
lobe and parietal lobe. No acute intracranial abnormality.
Specifically, no hemorrhage, hydrocephalus, mass lesion, acute
infarction, or significant intracranial injury.

Vascular: No hyperdense vessel or unexpected calcification.

Skull: No acute calvarial abnormality.

Sinuses/Orbits: Visualized paranasal sinuses and mastoids clear.
Orbital soft tissues unremarkable.

Other: None
IMPRESSION: Old left temporal/parietal infarct and encephalomalacia. No acute
intracranial abnormality.

## 2020-01-03 ENCOUNTER — Encounter: Payer: Self-pay | Admitting: Speech Pathology

## 2020-01-03 ENCOUNTER — Ambulatory Visit: Payer: PPO | Admitting: Speech Pathology

## 2020-01-03 ENCOUNTER — Other Ambulatory Visit: Payer: Self-pay

## 2020-01-03 DIAGNOSIS — R4701 Aphasia: Secondary | ICD-10-CM

## 2020-01-03 NOTE — Therapy (Signed)
[place] MAIN [facility] SERVICES [address] [place], [place], [address] Phone: [phone]   Fax:  [phone]  Speech Language Pathology Treatment  Patient Details  Name: Johnathan Campbell MRN: [id number] Date of Birth: 04-28-1961 Referring Provider (SLP): Dr. Manuella Ghazi   Encounter Date: 01/03/2020  End of Session - 01/03/20 1530    Visit Number  11    Number of Visits  12    Date for SLP Re-Evaluation  01/12/20    Authorization Type  Medicare    Authorization Time Period  Start 01/03/2020    Authorization - Visit Number  1    Authorization - Number of Visits  10    SLP Start Time  1000    SLP Stop Time   1050    SLP Time Calculation (min)  50 min    Activity Tolerance  Patient tolerated treatment well       Past Medical History:  Diagnosis Date  . Acute intractable headache    unspecified headache type  . Allergy   . Aphasia S/P CVA   . Clot    blood clot  . Completed stroke ([place])   . Hypercholesterolemia     Past Surgical History:  Procedure Laterality Date  . HERNIA REPAIR  twice    There were no vitals filed for this visit.  Subjective Assessment - 01/03/20 1529    Subjective  Patient was engaged and motivated thorughout the session.            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 Patient history is significant for aphasia and seizures. Patient received SLP services in 2017 and has returned because he says his speech is "still not where he wants it to be". Patient has been attending teletherapy sessions with the [facility] ([facility]) due to COVID-19, however, he is searching for individual therapy because he wants more intensive treatment.       Treatment Provided   Treatment provided   Cognitive-Linquistic      Pain Assessment   Pain Assessment  No/denies pain      Cognitive-Linquistic Treatment   Treatment focused on  Aphasia    Skilled Treatment  Imitate 2-syllable words (given auditory input only) with 50% accuracy.  Imitate rhyming words with 70% accuracy.  Imitate sentences of increasing length with 75% accuracy.  Patient can consistently read the target words accurately.  Read aloud similar word pairs with 90% accuracy.  In conversation, patient able to name and describe a movie given mod-max cues to clarify.      Assessment / Recommendations / Plan   Plan  Continue with current plan of care      Progression Toward Goals   Progression toward goals  Progressing toward goals       SLP Education - 01/03/20 1529    Education Details  auditory discrimination    Person(s) Educated  Patient    Methods  Explanation    Comprehension  Verbalized understanding         SLP Long Term Goals - 01/01/20 1330      SLP LONG TERM GOAL #1   Title  Patient will generate grammatical and cogent sentence to complete abstract/comple linguistic task with 80% accuracy.  Status  Partially Met    Target Date  01/12/20      SLP LONG TERM GOAL #2   Title  Patient will read aloud sentences and multi-syllabic words, maintaining phonemic accuracy, with 80% accuracy.      Status  Partially Met    Target Date  01/05/20      SLP LONG TERM GOAL #3   Title  Patient will write grammatical and cogent sentence to complete simple/basic linguistic task with 80% accuracy.    Status  Partially Met    Target Date  01/12/20      SLP LONG TERM GOAL #4   Title  Patient will complete multi-unit processing tasks with 80% accuracy without the need of repetition of task instructions or significant delays in responding.    Status  Partially Met    Target Date  01/12/20      SLP LONG TERM GOAL #5   Title  Patient will demonstrate reading comprehension for paragraphs with 80% accuracy.    Status   Partially Met    Target Date  01/12/20         Patient will benefit from skilled therapeutic intervention in order to improve the following deficits and impairments:   Aphasia    Problem List Patient Active Problem List   Diagnosis Date Noted  . Depression 01/16/2016  . Cephalalgia 06/03/2015  . Problems influencing health status 06/03/2015  . 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 Angiopathy 06/03/2015  . Completed stroke ([place]) 06/03/2015  . Hypercholesterolemia 06/03/2015   [name], MS/CCC- SLP  Lou Miner 01/03/2020, 3:32 PM  [place] MAIN [facility] SERVICES [address] [place], [place], [address] Phone: [phone]   Fax:  [phone]   Name: Johnathan Campbell MRN: [id number] Date of Birth: 1961/09/01

## 2020-01-08 ENCOUNTER — Encounter: Payer: Self-pay | Admitting: Speech Pathology

## 2020-01-08 ENCOUNTER — Ambulatory Visit: Payer: PPO | Admitting: Speech Pathology

## 2020-01-08 ENCOUNTER — Other Ambulatory Visit: Payer: Self-pay

## 2020-01-08 DIAGNOSIS — R4701 Aphasia: Secondary | ICD-10-CM

## 2020-01-08 NOTE — Therapy (Signed)
[place] MAIN [facility] SERVICES [address] [place], [place], [address] Phone: [phone]   Fax:  [phone]  Speech Language Pathology Treatment  Patient Details  Name: Johnathan Campbell MRN: [id number] Date of Birth: 12-10-1960 Referring Provider (SLP): Dr. Manuella Ghazi   Encounter Date: 01/08/2020  End of Session - 01/08/20 1251    Visit Number  12    Number of Visits  12    Date for SLP Re-Evaluation  01/12/20    Authorization Type  Medicare    Authorization Time Period  Start 01/03/2020    Authorization - Visit Number  2    Authorization - Number of Visits  10    SLP Start Time  1000    SLP Stop Time   1050    SLP Time Calculation (min)  50 min    Activity Tolerance  Patient tolerated treatment well       Past Medical History:  Diagnosis Date  . Acute intractable headache    unspecified headache type  . Allergy   . Aphasia S/P CVA   . Clot    blood clot  . Completed stroke ([place])   . Hypercholesterolemia     Past Surgical History:  Procedure Laterality Date  . HERNIA REPAIR  twice    There were no vitals filed for this visit.  Subjective Assessment - 01/08/20 1249    Subjective  Patient took his time today and communicated when he was guessing vs when he was hearing questions correctly.    Currently in Pain?  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 Patient history is significant for aphasia and seizures. Patient received SLP services in 2017 and has returned because he says his speech is "still not where he wants it to be". Patient has been attending teletherapy sessions with the [facility] ([facility]) due to COVID-19, however, he is searching for individual therapy because he  wants more intensive treatment.       Treatment Provided   Treatment provided  Cognitive-Linquistic      Pain Assessment   Pain Assessment  No/denies pain      Cognitive-Linquistic Treatment   Treatment focused on  Aphasia    Skilled Treatment  On an auditory comprehension task, patient was able to generate the correct answer without repetition of the question on 57% of the items. With repetition of the prompt, patient was 95% accurate.  The patient was then asked to do a barrier task and describe photos utilizing grammatical, cogent sentences. The patient was able to independently generate 4 complete sentences. With prompting from the clinician, he generated 8 sentences. Patient overall demonstrated improved cogency in his sentences, although he often omitted the subject and verb of the sentence. Given verbal cues from the clinician, the patient was able to incorporate subject + verb+ object sentence structure with 100% accuracy.      Assessment / Recommendations / Plan   Plan  Continue with current plan of care      Progression Toward Goals   Progression toward goals  Progressing toward goals       SLP Education - 01/08/20 1249    Education Details  auditory discrimination/comprehension,  Promoting Aphasics Communicative Effectiveness (PACE) technique    Person(s) Educated  Patient    Methods  Explanation    Comprehension  Verbalized understanding         SLP Long Term Goals - 01/01/20 1330      SLP LONG TERM GOAL #1   Title  Patient will generate grammatical and cogent sentence to complete abstract/comple linguistic task with 80% accuracy.    Status  Partially Met    Target Date  01/12/20      SLP LONG TERM GOAL #2   Title  Patient will read aloud sentences and multi-syllabic words, maintaining phonemic accuracy, with 80% accuracy.      Status  Partially Met    Target Date  01/05/20      SLP LONG TERM GOAL #3   Title  Patient will write grammatical and cogent sentence to  complete simple/basic linguistic task with 80% accuracy.    Status  Partially Met    Target Date  01/12/20      SLP LONG TERM GOAL #4   Title  Patient will complete multi-unit processing tasks with 80% accuracy without the need of repetition of task instructions or significant delays in responding.    Status  Partially Met    Target Date  01/12/20      SLP LONG TERM GOAL #5   Title  Patient will demonstrate reading comprehension for paragraphs with 80% accuracy.    Status  Partially Met    Target Date  01/12/20       Plan - 01/08/20 1252    Clinical Impression Statement  The patient recognized when he was guessing and asked the clinician to repeat the question in order to correctly identify all parts of the sentence. He independently slowed down on spontaneous speech tasks, without cues from the clinician. He was focused today, but communicated to the clinician that listening so closely/attentively is difficult.    Speech Therapy Frequency  2x / week    Duration  Other (comment)    Treatment/Interventions  Patient/family education;Language facilitation    Potential to Achieve Goals  Good    Potential Considerations  Ability to learn/carryover information;Family/community support;Previous level of function;Cooperation/participation level    Consulted and Agree with Plan of Care  Patient       Patient will benefit from skilled therapeutic intervention in order to improve the following deficits and impairments:   Aphasia    Problem List Patient Active Problem List   Diagnosis Date Noted  . Depression 01/16/2016  . Cephalalgia 06/03/2015  . Problems influencing health status 06/03/2015  . 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 Angiopathy 06/03/2015  . Completed stroke ([place]) 06/03/2015  . Hypercholesterolemia 06/03/2015    Vada Swift 01/08/2020, 12:57 PM  [place] MAIN [facility] SERVICES [address]  [place], [place], [address] Phone: [phone]   Fax:  [phone]   Name: Johnathan Campbell MRN: [id number] Date of Birth: [birth year]

## 2020-01-10 ENCOUNTER — Ambulatory Visit: Payer: PPO | Admitting: Speech Pathology

## 2020-01-10 ENCOUNTER — Other Ambulatory Visit: Payer: Self-pay

## 2020-01-10 DIAGNOSIS — R4701 Aphasia: Secondary | ICD-10-CM

## 2020-01-11 ENCOUNTER — Encounter: Payer: Self-pay | Admitting: Speech Pathology

## 2020-01-11 NOTE — Therapy (Signed)
[place] MAIN [facility] SERVICES [address] [place], [place], [address] Phone: [phone]   Fax:  [phone]  Speech Language Pathology Treatment/Re-Certification   Patient Details  Name: Johnathan Campbell MRN: [id number] Date of Birth: Dec 15, 1960 Referring Provider (SLP): Dr. Manuella Ghazi   Encounter Date: 01/10/2020  End of Session - 01/11/20 1531    Visit Number  13    Number of Visits  28    Date for SLP Re-Evaluation  03/08/20    Authorization Type  Medicare    Authorization Time Period  Start 01/03/2020    Authorization - Visit Number  3    Authorization - Number of Visits  10    SLP Start Time  1000    SLP Stop Time   1050    SLP Time Calculation (min)  50 min    Activity Tolerance  Patient tolerated treatment well       Past Medical History:  Diagnosis Date  . Acute intractable headache    unspecified headache type  . Allergy   . Aphasia S/P CVA   . Clot    blood clot  . Completed stroke ([place])   . Hypercholesterolemia     Past Surgical History:  Procedure Laterality Date  . HERNIA REPAIR  twice    There were no vitals filed for this visit.  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 Patient history is significant for aphasia and seizures. Patient received SLP services in 2017 and has returned because he says his speech is "still not where he wants it to be". Patient has been attending teletherapy sessions with the [facility] (TAP) due to COVID-19, however, he is searching for individual therapy because he wants more intensive treatment.       Treatment Provided   Treatment  provided  Cognitive-Linquistic      Pain Assessment   Pain Assessment  No/denies pain      Cognitive-Linquistic Treatment   Treatment focused on  Aphasia    Skilled Treatment  READING FLUENCY:  Read aloud complex sentences with 70% fluency.  Patient is not consistently aware of errors. VISUAL REASONING: Complete pictural analogies with 90% accuracy.  VERBAL EXPRESSION: Generates cogent, grammatical phrases/sentences to explain literal meaning of idioms and verbalizes abstract meaning of the idioms with 50% accuracy.  AUDITORY COMPREHENSION: Given multiple repetitions and re-wording, patient able to state item given 2 attributes with 80% accuracy.       Assessment / Recommendations / Plan   Plan  Continue with current plan of care      Progression Toward Goals   Progression toward goals  Progressing toward goals       SLP Education - 01/11/20 1531    Education Details  Influence of receptive aphasia on effective communication    Person(s) Educated  Patient    Methods  Explanation    Comprehension  Verbalized understanding         SLP Long Term Goals - 01/11/20 1537      SLP LONG TERM GOAL #1   Title  Patient will generate grammatical  and cogent sentence to complete abstract/comple linguistic task with 80% accuracy.    Time  8    Period  Weeks    Status  Partially Met    Target Date  03/08/20      SLP LONG TERM GOAL #2   Title  Patient will read aloud sentences and multi-syllabic words, maintaining phonemic accuracy, with 80% accuracy.      Time  8    Period  Weeks    Status  Partially Met    Target Date  03/08/20      SLP LONG TERM GOAL #3   Title  Patient will write grammatical and cogent sentence to complete simple/basic linguistic task with 80% accuracy.    Time  8    Period  Weeks    Status  Partially Met    Target Date  03/08/20      SLP LONG TERM GOAL #4   Title  Patient will complete multi-unit processing tasks with 80% accuracy without the need of repetition  of task instructions or significant delays in responding.    Time  8    Period  Weeks    Status  Partially Met    Target Date  03/08/20      SLP LONG TERM GOAL #5   Title  Patient will demonstrate reading comprehension for paragraphs with 80% accuracy.    Time  8    Period  Weeks    Status  Partially Met    Target Date  03/08/20       Plan - 01/11/20 1536    Clinical Impression Statement  The patient is making gains in all areas of language with positive impact on communication.  He is attentive and attempts to apply strategies he is given.  Will continue with speech therapy to improve language skills for effective communication.    Speech Therapy Frequency  2x / week    Duration  Other (comment)    Treatment/Interventions  Patient/family education;Language facilitation    Potential to Achieve Goals  Good    Potential Considerations  Ability to learn/carryover information;Family/community support;Previous level of function;Cooperation/participation level    Consulted and Agree with Plan of Care  Patient       Patient will benefit from skilled therapeutic intervention in order to improve the following deficits and impairments:   Aphasia - Plan: SLP plan of care cert/re-cert    Problem List Patient Active Problem List   Diagnosis Date Noted  . Depression 01/16/2016  . Cephalalgia 06/03/2015  . Problems influencing health status 06/03/2015  . 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 Angiopathy 06/03/2015  . Completed stroke ([place]) 06/03/2015  . Hypercholesterolemia 06/03/2015   [name], MS/CCC- SLP  Lou Miner 01/11/2020, 3:40 PM  [place] MAIN [facility] SERVICES [address] [place], [place], [address] Phone: [phone]   Fax:  [phone]   Name: Johnathan Campbell MRN: [id number] Date of Birth: 25-Apr-1961

## 2020-01-15 ENCOUNTER — Encounter: Payer: Self-pay | Admitting: Speech Pathology

## 2020-01-15 ENCOUNTER — Other Ambulatory Visit: Payer: Self-pay

## 2020-01-15 ENCOUNTER — Ambulatory Visit: Payer: PPO | Admitting: Speech Pathology

## 2020-01-15 DIAGNOSIS — R4701 Aphasia: Secondary | ICD-10-CM

## 2020-01-15 NOTE — Therapy (Signed)
[place] MAIN [facility] SERVICES [address] [place], [place], [address] Phone: [phone]   Fax:  [phone]  Speech Language Pathology Treatment  Patient Details  Name: Johnathan Campbell MRN: [id number] Date of Birth: 1961/01/24 Referring Provider (SLP): Dr. Manuella Ghazi   Encounter Date: 01/15/2020  End of Session - 01/15/20 1252    Visit Number  14    Number of Visits  28    Date for SLP Re-Evaluation  03/08/20    Authorization Type  Medicare    Authorization Time Period  Start 01/03/2020    Authorization - Visit Number  4    Authorization - Number of Visits  10    SLP Start Time  1000    SLP Stop Time   1050    SLP Time Calculation (min)  50 min    Activity Tolerance  Patient tolerated treatment well       Past Medical History:  Diagnosis Date  . Acute intractable headache    unspecified headache type  . Allergy   . Aphasia S/P CVA   . Clot    blood clot  . Completed stroke ([place])   . Hypercholesterolemia     Past Surgical History:  Procedure Laterality Date  . HERNIA REPAIR  twice    There were no vitals filed for this visit.  Subjective Assessment - 01/15/20 1251    Subjective  "Thank you for challenging me"    Currently in Pain?  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 Patient history is significant for aphasia and seizures. Patient received SLP services in 2017 and has returned because he says his speech is "still not where he wants it to be". Patient has been attending teletherapy sessions with the [facility] (T[facility]) due to COVID-19, however, he is searching for individual therapy because he wants more intensive treatment.       Treatment Provided   Treatment  provided  Cognitive-Linquistic      Pain Assessment   Pain Assessment  No/denies pain      Cognitive-Linquistic Treatment   Treatment focused on  Aphasia    Skilled Treatment  The patient read short questions requiring him to name objects by attribute. Reading fluency was 60% , patient was 80% accurate when asked to read the question a second time more slowly. Patient was 95% correct on comprehension, although he demonstrated phonemic paraphasias. Patient was read a short story and asked comprehension questions. He was 33% accurate. Given the article to read, he was 66% accurate and missed the one question he answered correctly on the previous trial. Patient was then asked open-ended questions and answered with 100% accuracy. On a writing task related to the reading, the patient was unable to generate written cogent answers without maximal clinician cues. Patient was prompted to write 3-syllable words delivered auditorily. He was 40% accurate on the task independently, and required mod-max clinician cues for the remaining 60%. He then was read the paragraph a final time and asked to summarize it. He retold the story and generated 7 key details from the story with no cues from the clinician.      Assessment / Recommendations / Plan  Plan  Continue with current plan of care      Progression Toward Goals   Progression toward goals  Progressing toward goals       SLP Education - 01/15/20 1251    Education Details  auditory comprehension/reading comprehension    Person(s) Educated  Patient    Methods  Explanation    Comprehension  Verbalized understanding         SLP Long Term Goals - 01/11/20 1537      SLP LONG TERM GOAL #1   Title  Patient will generate grammatical and cogent sentence to complete abstract/comple linguistic task with 80% accuracy.    Time  8    Period  Weeks    Status  Partially Met    Target Date  03/08/20      SLP LONG TERM GOAL #2   Title  Patient will read aloud  sentences and multi-syllabic words, maintaining phonemic accuracy, with 80% accuracy.      Time  8    Period  Weeks    Status  Partially Met    Target Date  03/08/20      SLP LONG TERM GOAL #3   Title  Patient will write grammatical and cogent sentence to complete simple/basic linguistic task with 80% accuracy.    Time  8    Period  Weeks    Status  Partially Met    Target Date  03/08/20      SLP LONG TERM GOAL #4   Title  Patient will complete multi-unit processing tasks with 80% accuracy without the need of repetition of task instructions or significant delays in responding.    Time  8    Period  Weeks    Status  Partially Met    Target Date  03/08/20      SLP LONG TERM GOAL #5   Title  Patient will demonstrate reading comprehension for paragraphs with 80% accuracy.    Time  8    Period  Weeks    Status  Partially Met    Target Date  03/08/20       Plan - 01/15/20 1252    Clinical Impression Statement  The patient is making gains in all areas of language with positive impact on communication.  He is attentive and attempts to apply strategies he is given. He comes to sessions highly motivated and eager to improve, and expressed an interest in improving reading/listening skills.   Speech Therapy Frequency  2x / week    Duration  Other (comment)    Treatment/Interventions  Patient/family education;Language facilitation    Potential to Achieve Goals  Good    Potential Considerations  Ability to learn/carryover information;Family/community support;Previous level of function;Cooperation/participation level    Consulted and Agree with Plan of Care  Patient       Patient will benefit from skilled therapeutic intervention in order to improve the following deficits and impairments:   Aphasia    Problem List Patient Active Problem List   Diagnosis Date Noted  . Depression 01/16/2016  . Cephalalgia 06/03/2015  . Problems influencing health status 06/03/2015  . Aphasia due to  late effects of cerebrovascular disease 06/03/2015  . Angiopathy 06/03/2015  . Completed stroke ([place]) 06/03/2015  . Hypercholesterolemia 06/03/2015    Emmie Tozier 01/15/2020, 12:54 PM  [place] MAIN [facility] SERVICES [address] [place], [place], [address] Phone: [phone]   Fax:  [phone]   Name: DEANTA MINCEY MRN:  [id number] Date of Birth: 01-25-1961

## 2020-01-17 ENCOUNTER — Other Ambulatory Visit: Payer: Self-pay

## 2020-01-17 ENCOUNTER — Ambulatory Visit: Payer: PPO | Admitting: Speech Pathology

## 2020-01-17 ENCOUNTER — Encounter: Payer: Self-pay | Admitting: Speech Pathology

## 2020-01-17 DIAGNOSIS — R4701 Aphasia: Secondary | ICD-10-CM | POA: Diagnosis not present

## 2020-01-17 NOTE — Therapy (Signed)
[place] MAIN [facility] SERVICES [address] [place], [place], [address] Phone: [phone]   Fax:  [phone]  Speech Language Pathology Treatment  Patient Details  Name: Johnathan Campbell MRN: [id number] Date of Birth: 1961-08-08 Referring Provider (SLP): Dr. Manuella Ghazi   Encounter Date: 01/17/2020  End of Session - 01/17/20 1247    Visit Number  15    Number of Visits  28    Date for SLP Re-Evaluation  03/08/20    Authorization Type  Medicare    Authorization Time Period  Start 01/03/2020    Authorization - Visit Number  5    Authorization - Number of Visits  10    SLP Start Time  1000    SLP Stop Time   1050    SLP Time Calculation (min)  50 min    Activity Tolerance  Patient tolerated treatment well       Past Medical History:  Diagnosis Date  . Acute intractable headache    unspecified headache type  . Allergy   . Aphasia S/P CVA   . Clot    blood clot  . Completed stroke ([place])   . Hypercholesterolemia     Past Surgical History:  Procedure Laterality Date  . HERNIA REPAIR  twice    There were no vitals filed for this visit.  Subjective Assessment - 01/17/20 1241    Subjective  Patient was in a pleasant mood.    Currently in Pain?  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 Patient history is significant for aphasia and seizures. Patient received SLP services in 2017 and has returned because he says his speech is "still not where he wants it to be". Patient has been attending teletherapy sessions with the [facility] (TAP) due to COVID-19, however, he is searching for individual therapy because he wants more intensive treatment.       Treatment Provided   Treatment  provided  Cognitive-Linquistic      Pain Assessment   Pain Assessment  No/denies pain      Cognitive-Linquistic Treatment   Treatment focused on  Aphasia    Skilled Treatment  Copy 10 4-syllable words 3 times with 93% accuracy, read them aloud with 50% accuracy. Patient read a story aloud and experienced difficulty with all words 3 syllables or greater (administration, Samantha, together, position). Wrote answers to reading comprehension questions with 50% accuracy.  When given the reading, patient was 100% accurate. He was able to generate two cogent responses to three open-ended questions. With clinician cues, he generated a cogent written response to the final prompt. Patient was then prompted to write the 10 4-syllable words from the beginning of the session. He was 50% accurate on the first spelling, and 100% accurate with multiple tries. The patient completed this task independently with no cues from the clinician. He read all words with perfect fluency at the end of the task. He needed multiple prompts to slow down today, especially on reading task.     Assessment / Recommendations / Plan   Plan  Continue with current plan of care      Progression Toward Goals   Progression toward goals  Progressing toward goals       SLP Education - 01/17/20 1247    Education Details  reading comprehension/writing    Person(s) Educated  Patient    Methods  Explanation    Comprehension  Verbalized understanding         SLP Long Term Goals - 01/11/20 1537      SLP LONG TERM GOAL #1   Title  Patient will generate grammatical and cogent sentence to complete abstract/comple linguistic task with 80% accuracy.    Time  8    Period  Weeks    Status  Partially Met    Target Date  03/08/20      SLP LONG TERM GOAL #2   Title  Patient will read aloud sentences and multi-syllabic words, maintaining phonemic accuracy, with 80% accuracy.      Time  8    Period  Weeks    Status  Partially Met     Target Date  03/08/20      SLP LONG TERM GOAL #3   Title  Patient will write grammatical and cogent sentence to complete simple/basic linguistic task with 80% accuracy.    Time  8    Period  Weeks    Status  Partially Met    Target Date  03/08/20      SLP LONG TERM GOAL #4   Title  Patient will complete multi-unit processing tasks with 80% accuracy without the need of repetition of task instructions or significant delays in responding.    Time  8    Period  Weeks    Status  Partially Met    Target Date  03/08/20      SLP LONG TERM GOAL #5   Title  Patient will demonstrate reading comprehension for paragraphs with 80% accuracy.    Time  8    Period  Weeks    Status  Partially Met    Target Date  03/08/20       Plan - 01/17/20 1247    Clinical Impression Statement  The patient is making gains in all areas of language with positive impact on communication.  He is attentive and attempts to apply strategies he is given. He comes to sessions highly motivated and eager to improve, and this time expressed an interest in improving reading/listening skills.    Speech Therapy Frequency  2x / week    Duration  Other (comment)    Treatment/Interventions  Patient/family education;Language facilitation    Potential to Achieve Goals  Good    Potential Considerations  Ability to learn/carryover information;Family/community support;Previous level of function;Cooperation/participation level    Consulted and Agree with Plan of Care  Patient       Patient will benefit from skilled therapeutic intervention in order to improve the following deficits and impairments:   Aphasia    Problem List Patient Active Problem List   Diagnosis Date Noted  . Depression 01/16/2016  . Cephalalgia 06/03/2015  . Problems influencing health status 06/03/2015  . 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 Angiopathy 06/03/2015  . Completed stroke ([place]) 06/03/2015  .  Hypercholesterolemia 06/03/2015    Supreme Rybarczyk 01/17/2020, 12:48 PM  [place] [facility] MAIN [facility] SERVICES [address] [place], [place], [address] Phone: [phone]   Fax:  [phone]   Name: Johnathan Campbell MRN: [id number] Date of Birth: 10-Aug-1961

## 2020-01-20 ENCOUNTER — Other Ambulatory Visit: Payer: Self-pay | Admitting: Family Medicine

## 2020-01-22 ENCOUNTER — Ambulatory Visit: Payer: PPO | Admitting: Speech Pathology

## 2020-01-22 ENCOUNTER — Encounter: Payer: Self-pay | Admitting: Speech Pathology

## 2020-01-22 ENCOUNTER — Other Ambulatory Visit: Payer: Self-pay

## 2020-01-22 DIAGNOSIS — R4701 Aphasia: Secondary | ICD-10-CM | POA: Diagnosis not present

## 2020-01-22 NOTE — Therapy (Signed)
[place] MAIN [facility] SERVICES [address] [place], [place], [address] Phone: [phone]   Fax:  [phone]  Speech Language Pathology Treatment  Patient Details  Name: Johnathan Campbell MRN: [id number] Date of Birth: 1961-02-05 Referring Provider (SLP): Dr. Manuella Ghazi   Encounter Date: 01/22/2020  End of Session - 01/22/20 1223    Visit Number  16    Number of Visits  28    Date for SLP Re-Evaluation  03/08/20    Authorization Type  Medicare    Authorization Time Period  Start 01/03/2020    Authorization - Visit Number  6    Authorization - Number of Visits  10    SLP Start Time  0900    SLP Stop Time   0950    SLP Time Calculation (min)  50 min    Activity Tolerance  Patient tolerated treatment well       Past Medical History:  Diagnosis Date  . Acute intractable headache    unspecified headache type  . Allergy   . Aphasia S/P CVA   . Clot    blood clot  . Completed stroke ([place])   . Hypercholesterolemia     Past Surgical History:  Procedure Laterality Date  . HERNIA REPAIR  twice    There were no vitals filed for this visit.  Subjective Assessment - 01/22/20 1222    Subjective  "This is hard for me."    Currently in Pain?  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 Patient history is significant for aphasia and seizures. Patient received SLP services in 2017 and has returned because he says his speech is "still not where he wants it to be". Patient has been attending teletherapy sessions with the [facility] (T[facility]) due to COVID-19, however, he is searching for individual therapy because he wants more intensive treatment.       Treatment Provided   Treatment provided   Cognitive-Linquistic      Pain Assessment   Pain Assessment  No/denies pain      Cognitive-Linquistic Treatment   Treatment focused on  Aphasia    Skilled Treatment  Wrote 10 previously learned 4-syllable words with no model with 80% accuracy independently. Required max cues for the final 2 words. Prompted with visuals to give the phonetic sound of 23 graphemes of English. Patient required max cues for /r/, /l/, /x/. Patient was prompted to complete sentences using homonyms. He self-corrected 3/3 incorrect answers independently and was 100% successful on the task. Initial errors were a result of reading comprehension issues and the patient resolved his mistakes by re-reading. He was lastly prompted to complete sentences with 4-syllable words and read them aloud. He required max cues to slow down and break the syllables down.       Assessment / Recommendations / Plan   Plan  Continue with current plan of care      Progression Toward Goals   Progression toward goals  Progressing toward goals       SLP Education - 01/22/20 1223    Education Details  phonics/writing    Person(s) Educated  Patient    Methods  Explanation  Comprehension  Verbalized understanding         SLP Long Term Goals - 01/11/20 1537      SLP LONG TERM GOAL #1   Title  Patient will generate grammatical and cogent sentence to complete abstract/comple linguistic task with 80% accuracy.    Time  8    Period  Weeks    Status  Partially Met    Target Date  03/08/20      SLP LONG TERM GOAL #2   Title  Patient will read aloud sentences and multi-syllabic words, maintaining phonemic accuracy, with 80% accuracy.      Time  8    Period  Weeks    Status  Partially Met    Target Date  03/08/20      SLP LONG TERM GOAL #3   Title  Patient will write grammatical and cogent sentence to complete simple/basic linguistic task with 80% accuracy.    Time  8    Period  Weeks    Status  Partially Met    Target Date   03/08/20      SLP LONG TERM GOAL #4   Title  Patient will complete multi-unit processing tasks with 80% accuracy without the need of repetition of task instructions or significant delays in responding.    Time  8    Period  Weeks    Status  Partially Met    Target Date  03/08/20      SLP LONG TERM GOAL #5   Title  Patient will demonstrate reading comprehension for paragraphs with 80% accuracy.    Time  8    Period  Weeks    Status  Partially Met    Target Date  03/08/20       Plan - 01/22/20 1224    Clinical Impression Statement  The patient is making gains in all areas of language with positive impact on communication.  He is attentive and attempts to apply strategies he is given. He comes to sessions highly motivated and eager to improve. The patient continues to rush through tasks and requires max cues to slow his speech, although he does improve on tasks when he slows down.    Speech Therapy Frequency  2x / week    Duration  Other (comment)    Treatment/Interventions  Patient/family education;Language facilitation    Potential to Achieve Goals  Good    Potential Considerations  Ability to learn/carryover information;Family/community support;Previous level of function;Cooperation/participation level    Consulted and Agree with Plan of Care  Patient       Patient will benefit from skilled therapeutic intervention in order to improve the following deficits and impairments:   Aphasia    Problem List Patient Active Problem List   Diagnosis Date Noted  . Depression 01/16/2016  . Cephalalgia 06/03/2015  . Problems influencing health status 06/03/2015  . 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 Angiopathy 06/03/2015  . Completed stroke ([place]) 06/03/2015  . Hypercholesterolemia 06/03/2015    Amariss Detamore 01/22/2020, 12:26 PM  [place] MAIN [facility] SERVICES [address] [place], [place], [address] Phone:  [phone]   Fax:  [phone]   Name: Johnathan Campbell MRN: [id number] Date of Birth: November 27, 1960

## 2020-01-24 ENCOUNTER — Encounter: Payer: Self-pay | Admitting: Speech Pathology

## 2020-01-24 ENCOUNTER — Ambulatory Visit: Payer: PPO | Admitting: Speech Pathology

## 2020-01-24 ENCOUNTER — Other Ambulatory Visit: Payer: Self-pay

## 2020-01-24 DIAGNOSIS — R4701 Aphasia: Secondary | ICD-10-CM | POA: Diagnosis not present

## 2020-01-24 NOTE — Therapy (Signed)
[place] MAIN [facility] SERVICES [address] [place], [place], [address] Phone: [phone]   Fax:  [phone]  Speech Language Pathology Treatment  Patient Details  Name: NICKALOS PETERSEN MRN: [id number] Date of Birth: 03-Jun-1961 Referring Provider (SLP): Dr. Manuella Ghazi   Encounter Date: 01/24/2020  End of Session - 01/24/20 1232    Visit Number  17    Number of Visits  28    Date for SLP Re-Evaluation  03/08/20    Authorization Type  Medicare    Authorization Time Period  Start 01/03/2020    Authorization - Visit Number  7    Authorization - Number of Visits  10    SLP Start Time  0900    SLP Stop Time   0950    SLP Time Calculation (min)  50 min    Activity Tolerance  Patient tolerated treatment well       Past Medical History:  Diagnosis Date  . Acute intractable headache    unspecified headache type  . Allergy   . Aphasia S/P CVA   . Clot    blood clot  . Completed stroke ([place])   . Hypercholesterolemia     Past Surgical History:  Procedure Laterality Date  . HERNIA REPAIR  twice    There were no vitals filed for this visit.  Subjective Assessment - 01/24/20 1231    Subjective  Patient persisted despite difficulty    Currently in Pain?  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 Patient history is significant for aphasia and seizures. Patient received SLP services in 2017 and has returned because he says his speech is "still not where he wants it to be". Patient has been attending teletherapy sessions with the [facility] ([facility]) due to COVID-19, however, he is searching for individual therapy because he wants more intensive treatment.       Treatment Provided   Treatment  provided  Cognitive-Linquistic      Pain Assessment   Pain Assessment  No/denies pain      Cognitive-Linquistic Treatment   Treatment focused on  Aphasia    Skilled Treatment  Repeat rhyming words with 100% accuracy given mod clinician cues. Able to describe meaning of each word with 100% accuracy. Patient wrote the grapheme corresponding to a phonetic sound and did so with 60% accuracy. Given clinician cues, he was able to identify/write the proper grapheme with 100% accuracy. At the end of the task, patient was asked to repeat the phonetic sound given an immediate model and then generate a word containing that sound. He was 86% accurate independently on repetition task, and 91% accurate generating a word containing the sound.       Assessment / Recommendations / Plan   Plan  Continue with current plan of care      Progression Toward Goals   Progression toward goals  Progressing toward goals       SLP Education - 01/24/20 1231    Education Details  phonics    Person(s) Educated  Patient    Methods  Explanation    Comprehension  Verbalized understanding         SLP Long Term  Goals - 01/11/20 1537      SLP LONG TERM GOAL #1   Title  Patient will generate grammatical and cogent sentence to complete abstract/comple linguistic task with 80% accuracy.    Time  8    Period  Weeks    Status  Partially Met    Target Date  03/08/20      SLP LONG TERM GOAL #2   Title  Patient will read aloud sentences and multi-syllabic words, maintaining phonemic accuracy, with 80% accuracy.      Time  8    Period  Weeks    Status  Partially Met    Target Date  03/08/20      SLP LONG TERM GOAL #3   Title  Patient will write grammatical and cogent sentence to complete simple/basic linguistic task with 80% accuracy.    Time  8    Period  Weeks    Status  Partially Met    Target Date  03/08/20      SLP LONG TERM GOAL #4   Title  Patient will complete multi-unit processing tasks with 80%  accuracy without the need of repetition of task instructions or significant delays in responding.    Time  8    Period  Weeks    Status  Partially Met    Target Date  03/08/20      SLP LONG TERM GOAL #5   Title  Patient will demonstrate reading comprehension for paragraphs with 80% accuracy.    Time  8    Period  Weeks    Status  Partially Met    Target Date  03/08/20       Plan - 01/24/20 1232    Clinical Impression Statement  The patient continues to make gains in all areas of language with a positive impact on communciation.    Speech Therapy Frequency  2x / week    Duration  Other (comment)    Treatment/Interventions  Patient/family education;Language facilitation    Potential to Achieve Goals  Good    Potential Considerations  Ability to learn/carryover information;Family/community support;Previous level of function;Cooperation/participation level    Consulted and Agree with Plan of Care  Patient       Patient will benefit from skilled therapeutic intervention in order to improve the following deficits and impairments:   Aphasia    Problem List Patient Active Problem List   Diagnosis Date Noted  . Depression 01/16/2016  . Cephalalgia 06/03/2015  . Problems influencing health status 06/03/2015  . 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 Angiopathy 06/03/2015  . Completed stroke ([place]) 06/03/2015  . Hypercholesterolemia 06/03/2015    Sakshi Sermons 01/24/2020, 12:34 PM  [place] MAIN [facility] SERVICES [address] [place], [place], [address] Phone: [phone]   Fax:  [phone]   Name: TRAJON ROSETE MRN: [id number] Date of Birth: June 10, 1961

## 2020-01-29 ENCOUNTER — Ambulatory Visit: Payer: PPO | Admitting: Speech Pathology

## 2020-01-29 ENCOUNTER — Other Ambulatory Visit: Payer: Self-pay

## 2020-01-29 ENCOUNTER — Encounter: Payer: Self-pay | Admitting: Speech Pathology

## 2020-01-29 DIAGNOSIS — R4701 Aphasia: Secondary | ICD-10-CM | POA: Diagnosis not present

## 2020-01-29 NOTE — Therapy (Signed)
[place] MAIN [facility] SERVICES [address] [place], [place], [address] Phone: [phone]   Fax:  [phone]  Speech Language Pathology Treatment  Patient Details  Name: Johnathan Campbell MRN: [id number] Date of Birth: 1961-02-07 Referring Provider (SLP): Dr. Manuella Ghazi   Encounter Date: 01/29/2020  End of Session - 01/29/20 1204    Visit Number  18    Number of Visits  28    Date for SLP Re-Evaluation  03/08/20    Authorization Type  Medicare    Authorization Time Period  Start 01/03/2020    Authorization - Visit Number  8    Authorization - Number of Visits  10    SLP Start Time  0900    SLP Stop Time   0950    SLP Time Calculation (min)  50 min    Activity Tolerance  Patient tolerated treatment well       Past Medical History:  Diagnosis Date  . Acute intractable headache    unspecified headache type  . Allergy   . Aphasia S/P CVA   . Clot    blood clot  . Completed stroke ([place])   . Hypercholesterolemia     Past Surgical History:  Procedure Laterality Date  . HERNIA REPAIR  twice    There were no vitals filed for this visit.  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 Patient history is significant for aphasia and seizures. Patient received SLP services in 2017 and has returned because he says his speech is "still not where he wants it to be". Patient has been attending teletherapy sessions with the [facility] ([facility]) due to COVID-19, however, he is searching for individual therapy because he wants more intensive treatment.       Treatment Provided   Treatment provided  Cognitive-Linquistic       Pain Assessment   Pain Assessment  No/denies pain      Cognitive-Linquistic Treatment   Treatment focused on  Aphasia    Skilled Treatment  Repeat CVC rhyming words with 80% accuracy. Identify/define words with 96% accuracy. Write grapheme given phonetic sound with 83% accuracy. Repeat sound given immediate model with 86% accuracy (missed /h/, x and /r/). Gave an example of each sound in a word with 100% accuracy.  Given short sentences containing homonyms, write homonyms with 50% accuracy independently. With min cues, increased to 75% accuracy.      Assessment / Recommendations / Plan   Plan  Continue with current plan of care      Progression Toward Goals   Progression toward goals  Progressing toward goals       SLP Education - 01/29/20 1204    Education Details  phonics/writing    Person(s) Educated  Patient    Methods  Explanation    Comprehension  Verbalized understanding         SLP Long Term Goals - 01/11/20 1537      SLP LONG TERM GOAL #1   Title  Patient will generate grammatical and cogent sentence to complete abstract/comple linguistic task with 80% accuracy.    Time  8    Period  Weeks    Status  Partially Met    Target Date  03/08/20      SLP LONG TERM GOAL #2   Title  Patient will read aloud sentences and multi-syllabic words, maintaining phonemic accuracy, with 80% accuracy.      Time  8    Period  Weeks    Status  Partially Met    Target Date  03/08/20      SLP LONG TERM GOAL #3   Title  Patient will write grammatical and cogent sentence to complete simple/basic linguistic task with 80% accuracy.    Time  8    Period  Weeks    Status  Partially Met    Target Date  03/08/20      SLP LONG TERM GOAL #4   Title  Patient will complete multi-unit processing tasks with 80% accuracy without the need of repetition of task instructions or significant delays in responding.    Time  8    Period  Weeks    Status  Partially Met    Target Date  03/08/20       SLP LONG TERM GOAL #5   Title  Patient will demonstrate reading comprehension for paragraphs with 80% accuracy.    Time  8    Period  Weeks    Status  Partially Met    Target Date  03/08/20       Plan - 01/29/20 1205    Clinical Impression Statement  The patient demonstrated excellent motivation throughout the session. He continues to make gains in all areas of language with a positive impact on communciation, particularly in auditory comprehension.    Speech Therapy Frequency  2x / week    Duration  Other (comment)    Treatment/Interventions  Patient/family education;Language facilitation    Potential to Achieve Goals  Good    Potential Considerations  Ability to learn/carryover information;Family/community support;Previous level of function;Cooperation/participation level    Consulted and Agree with Plan of Care  Patient       Patient will benefit from skilled therapeutic intervention in order to improve the following deficits and impairments:   Aphasia    Problem List Patient Active Problem List   Diagnosis Date Noted  . Depression 01/16/2016  . Cephalalgia 06/03/2015  . Problems influencing health status 06/03/2015  . 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 Angiopathy 06/03/2015  . Completed stroke ([place]) 06/03/2015  . Hypercholesterolemia 06/03/2015    Jerryl Holzhauer 01/29/2020, 12:07 PM  [place] MAIN [facility] SERVICES [address] [place], [place], [address] Phone: [phone]   Fax:  [phone]   Name: Johnathan Campbell MRN: [id number] Date of Birth: [birth year]

## 2020-01-31 ENCOUNTER — Other Ambulatory Visit: Payer: Self-pay

## 2020-01-31 ENCOUNTER — Encounter: Payer: Self-pay | Admitting: Speech Pathology

## 2020-01-31 ENCOUNTER — Ambulatory Visit: Payer: PPO | Admitting: Speech Pathology

## 2020-01-31 DIAGNOSIS — R4701 Aphasia: Secondary | ICD-10-CM

## 2020-01-31 NOTE — Therapy (Signed)
[place] MAIN [facility] SERVICES [address] [place], [place], [address] Phone: [phone]   Fax:  [phone]  Speech Language Pathology Treatment  Patient Details  Name: Johnathan Campbell MRN: [id number] Date of Birth: [birth year] Referring Provider (SLP): Dr. Manuella Ghazi   Encounter Date: 01/31/2020  End of Session - 01/31/20 1234    Visit Number  19    Number of Visits  28    Date for SLP Re-Evaluation  03/08/20    Authorization Type  Medicare    Authorization Time Period  Start 01/03/2020    Authorization - Visit Number  9    Authorization - Number of Visits  10    SLP Start Time  1100    SLP Stop Time   1150    SLP Time Calculation (min)  50 min    Activity Tolerance  Patient tolerated treatment well       Past Medical History:  Diagnosis Date  . Acute intractable headache    unspecified headache type  . Allergy   . Aphasia S/P CVA   . Clot    blood clot  . Completed stroke ([place])   . Hypercholesterolemia     Past Surgical History:  Procedure Laterality Date  . HERNIA REPAIR  twice    There were no vitals filed for this visit.  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 Patient history is significant for aphasia and seizures. Patient received SLP services in 2017 and has returned because he says his speech is "still not where he wants it to be". Patient has been attending teletherapy sessions with the [facility] ([facility]) due to COVID-19, however, he is searching for individual therapy because he wants more intensive treatment.       Treatment Provided   Treatment provided  Cognitive-Linquistic      Pain  Assessment   Pain Assessment  No/denies pain      Cognitive-Linquistic Treatment   Treatment focused on  Aphasia    Skilled Treatment  Write grapheme given phonetic sound with 91% accuracy. Gave an example of each sound in a  word with 100% accuracy. Create rhyming words utilizing phonics with 100% accuracy. Identify ungrammatical phrases with rhyming words with mod clinician cues. Identify numbers said auditorily by pointing to numbers with 80% accuracy. Write numbers given numbers auditorily with 70% accuracy. Increased to 100% accuracy given min clinician cues.      Assessment / Recommendations / Plan   Plan  Continue with current plan of care      Progression Toward Goals   Progression toward goals  Progressing toward goals       SLP Education - 01/31/20 1233    Education Details  phonics/writing and auditory comprehension    Person(s) Educated  Patient    Methods  Explanation    Comprehension  Verbalized understanding         SLP Long Term Goals - 01/11/20 1537      SLP LONG TERM GOAL #1   Title  Patient will generate grammatical and cogent sentence to complete abstract/comple linguistic task with  80% accuracy.    Time  8    Period  Weeks    Status  Partially Met    Target Date  03/08/20      SLP LONG TERM GOAL #2   Title  Patient will read aloud sentences and multi-syllabic words, maintaining phonemic accuracy, with 80% accuracy.      Time  8    Period  Weeks    Status  Partially Met    Target Date  03/08/20      SLP LONG TERM GOAL #3   Title  Patient will write grammatical and cogent sentence to complete simple/basic linguistic task with 80% accuracy.    Time  8    Period  Weeks    Status  Partially Met    Target Date  03/08/20      SLP LONG TERM GOAL #4   Title  Patient will complete multi-unit processing tasks with 80% accuracy without the need of repetition of task instructions or significant delays in responding.    Time  8    Period  Weeks    Status   Partially Met    Target Date  03/08/20      SLP LONG TERM GOAL #5   Title  Patient will demonstrate reading comprehension for paragraphs with 80% accuracy.    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 Although it contained some grammatical errors, the paragraph was cogent and well-written. The patient also expressed a desire to work on numbers because it is very difficult for him in his work to hear numbers correctly.    Speech Therapy Frequency  2x / week    Duration  Other (comment)    Treatment/Interventions  Patient/family education;Language facilitation    Potential to Achieve Goals  Good    Potential Considerations  Ability to learn/carryover information;Family/community support;Previous level of function;Cooperation/participation level    Consulted and Agree with Plan of Care  Patient       Patient will benefit from skilled therapeutic intervention in order to improve the following deficits and impairments:   Aphasia    Problem List Patient Active Problem List   Diagnosis Date Noted  . Depression 01/16/2016  . Cephalalgia 06/03/2015  . Problems influencing health status 06/03/2015  . 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 Angiopathy 06/03/2015  . Completed stroke ([place]) 06/03/2015  . Hypercholesterolemia 06/03/2015    Janiaya Ryser 01/31/2020, 12:36 PM  [place] MAIN [facility] SERVICES [address] [place], [place], [address] Phone: [phone]   Fax:  [phone]   Name: Johnathan Campbell MRN: [id number] Date of Birth: 26-Oct-1961

## 2020-02-05 ENCOUNTER — Encounter: Payer: Self-pay | Admitting: Speech Pathology

## 2020-02-05 ENCOUNTER — Ambulatory Visit: Payer: PPO | Attending: Neurology | Admitting: Speech Pathology

## 2020-02-05 ENCOUNTER — Other Ambulatory Visit: Payer: Self-pay

## 2020-02-05 DIAGNOSIS — R4701 Aphasia: Secondary | ICD-10-CM | POA: Diagnosis not present

## 2020-02-05 NOTE — Therapy (Signed)
[place] MAIN [facility] SERVICES [address] [place], [place], [address] Phone: [phone]   Fax:  [phone]  Speech Language Pathology Treatment  Speech Therapy Progress Note   Dates of reporting period  01/03/2020   to   02/05/2020   Patient Details  Name: Johnathan Campbell MRN: [id number] Date of Birth: 08/04/1961 Referring Provider (SLP): Dr. Manuella Ghazi   Encounter Date: 02/05/2020  End of Session - 02/05/20 1256    Visit Number  20    Number of Visits  28    Date for SLP Re-Evaluation  03/08/20    Authorization Type  Medicare    Authorization Time Period  Start 01/03/2020    Authorization - Visit Number  10    Authorization - Number of Visits  10    SLP Start Time  1100    SLP Stop Time   1150    SLP Time Calculation (min)  50 min    Activity Tolerance  Patient tolerated treatment well       Past Medical History:  Diagnosis Date  . Acute intractable headache    unspecified headache type  . Allergy   . Aphasia S/P CVA   . Clot    blood clot  . Completed stroke ([place])   . Hypercholesterolemia     Past Surgical History:  Procedure Laterality Date  . HERNIA REPAIR  twice    There were no vitals filed for this visit.  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 Patient history is significant for aphasia and seizures. Patient received SLP services in 2017 and has returned because he says his speech is "still not where he wants it to be". Patient has been attending teletherapy sessions with the [facility] ([facility]) due to COVID-19, however, he is searching for individual therapy because he wants more  intensive treatment.       Treatment Provided   Treatment provided  Cognitive-Linquistic      Pain Assessment   Pain Assessment  No/denies pain      Cognitive-Linquistic Treatment   Treatment focused on  Aphasia    Skilled Treatment  Write grapheme given phonetic sound with 86% accuracy. Gave an example of each word with 100% accuracy. Identify numbers delivered auditorily given visual aid with 70% accuracy. On a reading comprehension/math language task, patient was 78% accurate independently, increased to 100% given mod clinician cues. His errors were due to reading comprehension difficulty. He was able to explain his reasoning with some level of cogency on all questions. Lastly, the patient was asked to sequence numbers together into groups of 3. He was 0% accurate without cues from the clinician. Given each number individually, he was able to sequence them with 80% accuracy.     Assessment / Recommendations / Plan   Plan  Continue with current plan of care      Progression Toward Goals   Progression toward goals  Progressing toward goals       SLP Education - 02/05/20 1255    Education Details  phonics, auditory comprehension and reading comprehension  Person(s) Educated  Patient    Methods  Explanation    Comprehension  Verbalized understanding         SLP Long Term Goals - 02/05/20 1348      SLP LONG TERM GOAL #1   Title  Patient will generate grammatical and cogent sentence to complete abstract/comple linguistic task with 80% accuracy.    Status  Partially Met    Target Date  03/08/20      SLP LONG TERM GOAL #2   Title  Patient will read aloud sentences and multi-syllabic words, maintaining phonemic accuracy, with 80% accuracy.      Status  Partially Met    Target Date  03/08/20      SLP LONG TERM GOAL #3   Title  Patient will write grammatical and cogent sentence to complete simple/basic linguistic task with 80% accuracy.    Status  Partially Met    Target Date   03/08/20      SLP LONG TERM GOAL #4   Title  Patient will complete multi-unit processing tasks with 80% accuracy without the need of repetition of task instructions or significant delays in responding.    Status  Partially Met    Target Date  03/08/20      SLP LONG TERM GOAL #5   Title  Patient will demonstrate reading comprehension for paragraphs with 80% accuracy.    Status  Partially Met    Target Date  03/08/20       Plan - 02/05/20 1256    Clinical Impression Statement  Patient strengths are a sense of humor and a gritty motivation to achieve his goals. His deficits in reading comprehension and auditory comprehension limit his understanding, but he continues to demonstrate a desire to improve his skills in order to be more involved in communication with his family and friends.    Speech Therapy Frequency  2x / week    Duration  Other (comment)    Treatment/Interventions  Patient/family education;Language facilitation    Potential to Achieve Goals  Good    Potential Considerations  Ability to learn/carryover information;Family/community support;Previous level of function;Cooperation/participation level    Consulted and Agree with Plan of Care  Patient       Patient will benefit from skilled therapeutic intervention in order to improve the following deficits and impairments:   Aphasia    Problem List Patient Active Problem List   Diagnosis Date Noted  . Depression 01/16/2016  . Cephalalgia 06/03/2015  . Problems influencing health status 06/03/2015  . 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 Angiopathy 06/03/2015  . Completed stroke ([place]) 06/03/2015  . Hypercholesterolemia 06/03/2015    Lou Miner 02/05/2020, 1:49 PM  [place] MAIN [facility] SERVICES [address] [place], [place], [address] Phone: [phone]   Fax:  [phone]   Name: TREMON SAINVIL MRN: [id number] Date of Birth: 04/02/1961

## 2020-02-07 ENCOUNTER — Other Ambulatory Visit: Payer: Self-pay

## 2020-02-07 ENCOUNTER — Encounter: Payer: Self-pay | Admitting: Speech Pathology

## 2020-02-07 ENCOUNTER — Ambulatory Visit: Payer: PPO | Admitting: Speech Pathology

## 2020-02-07 DIAGNOSIS — R4701 Aphasia: Secondary | ICD-10-CM | POA: Diagnosis not present

## 2020-02-07 NOTE — Therapy (Signed)
[facility] REHAB SERVICES [address] [place], [place], [address] Phone: [phone]   Fax:  [phone]  Speech Language Pathology Treatment  Patient Details  Name: Johnathan Campbell MRN: [id number] Date of Birth: 60/06/1961 Referring Provider (SLP): Dr. Shah   Encounter Date: 02/07/2020  End of Session - 02/07/20 1131    Visit Number  21    Number of Visits  28    Date for SLP Re-Evaluation  03/08/20    Authorization Type  Medicare    Authorization Time Period  Start 02/07/2020    Authorization - Visit Number  1    Authorization - Number of Visits  10    SLP Start Time  1005    SLP Stop Time   1055    SLP Time Calculation (min)  50 min    Activity Tolerance  Patient tolerated treatment well       Past Medical History:  Diagnosis Date  . Acute intractable headache    unspecified headache type  . Allergy   . Aphasia S/P CVA   . Clot    blood clot  . Completed stroke (HCC)   . Hypercholesterolemia     Past Surgical History:  Procedure Laterality Date  . HERNIA REPAIR  twice    There were no vitals filed for this visit.  Subjective Assessment - 02/07/20 1130    Subjective  Patient was tired from a long work day yesterday.            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 Patient history is significant for aphasia and seizures. Patient received SLP services in 2017 and has returned because he says his speech is "still not where he wants it to be". Patient has been attending teletherapy sessions with the [facility] ([facility]) due to COVID-19, however, he is searching for individual therapy because he wants more intensive treatment.       Cognitive-Linquistic Treatment   Treatment focused on   Aphasia    Skilled Treatment  Write grapheme given phonetic sound with 95% accuracy. Match numbers 1-10 (auditory) with written word with 90% accuracy. Completed auditory discrimination task with numbers 1-10 with 88% accuracy. Match numbers 11-20 with written word with 60% accuracy. Patient had severe difficulty with numbers 11, 12 and 15. When asked to match 1-20 with written word given auditory stimulus, patient was 95% accurate. Patient given small tokens and asked to give the clincian the correct amount given an auditory prompt (ie "give me 4"). Patient was 80% accurate. Mistakes were due to misheard numerical values. Patient then prompted to count to 51 using tokens. 80% accurate on task. Patient asked to count up to 100 by 10s. Patient was 60% accurate, mistakes were often "16, 17, 18" instead of "60, 70, 80". Patient often inserted a "20" between his numbers (ie: 20, 30, 20, 40, 20, 50, 20, 60, 20. 70...)      Assessment / Recommendations / Plan   Plan  Continue with current plan of care      Progression Toward Goals   Progression toward goals  Progressing toward goals       SLP Education - 02/07/20 1130    Education Details  phonics and auditory comprehension      Person(s) Educated  Patient    Methods  Explanation    Comprehension  Verbalized understanding         SLP Long Term Goals - 02/05/20 1348      SLP LONG TERM GOAL #1   Title  Patient will generate grammatical and cogent sentence to complete abstract/comple linguistic task with 80% accuracy.    Status  Partially Met    Target Date  03/08/20      SLP LONG TERM GOAL #2   Title  Patient will read aloud sentences and multi-syllabic words, maintaining phonemic accuracy, with 80% accuracy.      Status  Partially Met    Target Date  03/08/20      SLP LONG TERM GOAL #3   Title  Patient will write grammatical and cogent sentence to complete simple/basic linguistic task with 80% accuracy.    Status  Partially Met    Target  Date  03/08/20      SLP LONG TERM GOAL #4   Title  Patient will complete multi-unit processing tasks with 80% accuracy without the need of repetition of task instructions or significant delays in responding.    Status  Partially Met    Target Date  03/08/20      SLP LONG TERM GOAL #5   Title  Patient will demonstrate reading comprehension for paragraphs with 80% accuracy.    Status  Partially Met    Target Date  03/08/20       Plan - 02/07/20 1131    Clinical Impression Statement  Patient strengths are a sense of humor and a gritty motivation to achieve his goals. His reading comprehension and auditory comprehension limit his understanding but he continues to demonstrate a desire to improve his skills in order to be more involved.    Speech Therapy Frequency  2x / week    Duration  Other (comment)    Treatment/Interventions  Patient/family education;Language facilitation    Potential to Achieve Goals  Good    Potential Considerations  Ability to learn/carryover information;Family/community support;Previous level of function;Cooperation/participation level    Consulted and Agree with Plan of Care  Patient       Patient will benefit from skilled therapeutic intervention in order to improve the following deficits and impairments:   Aphasia    Problem List Patient Active Problem List   Diagnosis Date Noted  . Depression 01/16/2016  . Cephalalgia 06/03/2015  . Problems influencing health status 06/03/2015  . 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 Angiopathy 06/03/2015  . Completed stroke (HCC) 06/03/2015  . Hypercholesterolemia 06/03/2015    Abernathy, Susie 02/07/2020, 1:51 PM  [facility] MA[facility] RE[facility] SERVICES [address] [place], [place], [address] Phone: [phone]   Fax:  [phone]   Name: Viraaj A Corporan MRN: [id number] Date of Birth: 60/04/1961 

## 2020-02-14 ENCOUNTER — Ambulatory Visit: Payer: PPO | Admitting: Speech Pathology

## 2020-02-14 ENCOUNTER — Encounter: Payer: Self-pay | Admitting: Speech Pathology

## 2020-02-14 ENCOUNTER — Other Ambulatory Visit: Payer: Self-pay

## 2020-02-14 DIAGNOSIS — R4701 Aphasia: Secondary | ICD-10-CM

## 2020-02-14 NOTE — Therapy (Signed)
[place] MAIN [facility] SERVICES [address] [place], [place], [address] Phone: [phone]   Fax:  [phone]  Speech Language Pathology Treatment  Patient Details  Name: Johnathan Campbell MRN: [id number] Date of Birth: 05/27/1961 Referring Provider (SLP): Dr. Manuella Ghazi   Encounter Date: 02/14/2020  End of Session - 02/14/20 1231    Visit Number  22    Number of Visits  28    Date for SLP Re-Evaluation  03/08/20    Authorization Type  Medicare    Authorization Time Period  Start 02/07/2020    Authorization - Visit Number  2    Authorization - Number of Visits  10    SLP Start Time  1100    SLP Stop Time   1150    SLP Time Calculation (min)  50 min    Activity Tolerance  Patient tolerated treatment well       Past Medical History:  Diagnosis Date  . Acute intractable headache    unspecified headache type  . Allergy   . Aphasia S/P CVA   . Clot    blood clot  . Completed stroke ([place])   . Hypercholesterolemia     Past Surgical History:  Procedure Laterality Date  . HERNIA REPAIR  twice    There were no vitals filed for this visit.  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 Patient history is significant for aphasia and seizures. Patient received SLP services in 2017 and has returned because he says his speech is "still not where he wants it to be". Patient has been attending teletherapy sessions with the [facility] ([facility]) due to COVID-19, however, he is searching for individual therapy because he wants more intensive treatment.       Cognitive-Linquistic Treatment   Treatment focused on  Aphasia    Skilled  Treatment  Write grapheme given phonetic sound with 95% accuracy. The patient made a personal goal to be able to listen to/comprehend numbers 1-100 with 90% accuracy. Patient was 84% accurate on numerical auditory discrimination tasks (total). Given number cards, the patient was 92% accurate at creating the correct number given auditory input.  Given tokens (dimes and pennies), the patient was 81% accurate conveying numerical data in concrete terms. His errors were significant for mistaking words for phonetically similar counterparts and accurately generating multiples of ten.. Patient has severe difficulty with words that sound similar phonetically (ie six/seven, nineteen/ninety). Patient generates automatic series (counting) with no errors.      Assessment / Recommendations / Plan   Plan  Continue with current plan of care      Progression Toward Goals   Progression toward goals  Progressing toward goals       SLP Education - 02/14/20 1230    Education Details  phonics and auditory comprehension    Person(s) Educated  Patient    Methods  Explanation    Comprehension  Verbalized understanding         SLP Long Term Goals - 02/05/20 1348      SLP LONG TERM GOAL #1   Title  Patient will generate grammatical and cogent sentence to complete abstract/comple linguistic task with 80% accuracy.    Status  Partially Met    Target Date  03/08/20      SLP LONG TERM GOAL #2   Title  Patient will read aloud sentences and multi-syllabic words, maintaining phonemic accuracy, with 80% accuracy.      Status  Partially Met    Target Date  03/08/20      SLP LONG TERM GOAL #3   Title  Patient will write grammatical and cogent sentence to complete simple/basic linguistic task with 80% accuracy.    Status  Partially Met    Target Date  03/08/20      SLP LONG TERM GOAL #4   Title  Patient will complete multi-unit processing tasks with 80% accuracy without the need of repetition of task instructions or  significant delays in responding.    Status  Partially Met    Target Date  03/08/20      SLP LONG TERM GOAL #5   Title  Patient will demonstrate reading comprehension for paragraphs with 80% accuracy.    Status  Partially Met    Target Date  03/08/20       Plan - 02/14/20 1231    Clinical Impression Statement  Patient is a hard worker and very driven to complete tasks well.The patient continues to present impaired repetition and auditory comprehension. He requires clinician cues to slow down in order to maintain accuracy on tasks and to attend to tasks. He asks for repetitions often which may be considered a secondary to auditory comprehension deficits and/or attention deficits. The patient has improved on phonetic tasks at the single sound level and may benefit from CV structure repetition tasks.   Speech Therapy Frequency  2x / week    Duration  Other (comment)    Treatment/Interventions  Patient/family education;Language facilitation    Potential to Achieve Goals  Good    Potential Considerations  Ability to learn/carryover information;Family/community support;Previous level of function;Cooperation/participation level    Consulted and Agree with Plan of Care  Patient       Patient will benefit from skilled therapeutic intervention in order to improve the following deficits and impairments:   Aphasia    Problem List Patient Active Problem List   Diagnosis Date Noted  . Depression 01/16/2016  . Cephalalgia 06/03/2015  . Problems influencing health status 06/03/2015  . 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 Angiopathy 06/03/2015  . Completed stroke ([place]) 06/03/2015  . Hypercholesterolemia 06/03/2015    Johnathan Campbell 02/14/2020, 12:39 PM  [place] MAIN [facility] SERVICES [address] [place], [place], [address] Phone: [phone]   Fax:  [phone]   Name: Johnathan Campbell MRN: [id number] Date of Birth:  02-14-1961

## 2020-02-16 ENCOUNTER — Encounter: Payer: Self-pay | Admitting: Speech Pathology

## 2020-02-16 ENCOUNTER — Other Ambulatory Visit: Payer: Self-pay

## 2020-02-16 ENCOUNTER — Ambulatory Visit: Payer: PPO | Admitting: Speech Pathology

## 2020-02-16 DIAGNOSIS — R4701 Aphasia: Secondary | ICD-10-CM

## 2020-02-16 NOTE — Therapy (Signed)
[place] MAIN [facility] SERVICES [address] [place], [place], [address] Phone: [phone]   Fax:  [phone]  Speech Language Pathology Treatment  Patient Details  Name: Johnathan Campbell MRN: [id number] Date of Birth: 08-11-1961 Referring Provider (SLP): Dr. Manuella Ghazi   Encounter Date: 02/16/2020  End of Session - 02/16/20 1057    Visit Number  23    Number of Visits  28    Date for SLP Re-Evaluation  03/08/20    Authorization Type  Medicare    Authorization Time Period  Start 02/07/2020    Authorization - Visit Number  3    Authorization - Number of Visits  10    SLP Start Time  1000    SLP Stop Time   1050    SLP Time Calculation (min)  50 min    Activity Tolerance  Patient tolerated treatment well       Past Medical History:  Diagnosis Date  . Acute intractable headache    unspecified headache type  . Allergy   . Aphasia S/P CVA   . Clot    blood clot  . Completed stroke ([place])   . Hypercholesterolemia     Past Surgical History:  Procedure Laterality Date  . HERNIA REPAIR  twice    There were no vitals filed for this visit.  Subjective Assessment - 02/16/20 1057    Subjective  Patient complained of being very tired today, attributed to work and second dose of COVID-19 vaccine.            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 Patient history is significant for aphasia and seizures. Patient received SLP services in 2017 and has returned because he says his speech is "still not where he wants it to be". Patient has been attending teletherapy sessions with the [facility] ([facility]) due to COVID-19, however, he is searching for individual therapy because he wants more intensive treatment.       Cognitive-Linquistic Treatment   Treatment focused on  Aphasia    Skilled Treatment  Write grapheme given phonetic sound with 69% accuracy. On rhyming task, patient was 69% accurate on auditory discrimination with rhyming words ending in /ap/ after an initial trial with visual/written cues. The patient was 66% accurate on auditory discrimination task with words ending with /ag/ after an initial trial with visual/written cues. In genreal, the patient demonstrates difficulty with voiced velar sounds in word initial, medial and final positions. Patient identified 90% of words for the numbers 1-20 given visual and tactile cues for syllabification. He utilized the syllable tactile board with 90% accuracy.     Assessment / Recommendations / Plan   Plan  Continue with current plan of care      Progression Toward Goals   Progression toward goals  Progressing toward goals       SLP Education - 02/16/20 1057    Education Details  phonics/auditory comprehension    Person(s) Educated  Patient    Methods  Explanation    Comprehension  Verbalized understanding         SLP Long Term Goals - 02/05/20 1348      SLP LONG TERM GOAL #1   Title  Patient will generate grammatical and  cogent sentence to complete abstract/comple linguistic task with 80% accuracy.    Status  Partially Met    Target Date  03/08/20      SLP LONG TERM GOAL #2   Title  Patient will read aloud sentences and multi-syllabic words, maintaining phonemic accuracy, with 80% accuracy.      Status  Partially Met    Target Date  03/08/20      SLP LONG TERM GOAL #3   Title  Patient will write grammatical and cogent sentence to complete simple/basic linguistic task with 80% accuracy.    Status  Partially Met    Target Date  03/08/20      SLP LONG TERM GOAL #4   Title  Patient will complete multi-unit processing tasks with 80% accuracy without the need of repetition of task instructions or significant delays in responding.    Status   Partially Met    Target Date  03/08/20      SLP LONG TERM GOAL #5   Title  Patient will demonstrate reading comprehension for paragraphs with 80% accuracy.    Status  Partially Met    Target Date  03/08/20       Plan - 02/16/20 1058    Clinical Impression Statement  Patient continues to rush through tasks. Given tactile and visual cues to slow down, the patient is much more effective at generating multisyllabic words accurately. He has complained of exhaustion on multiple occasions for the past few sessions which he attributes to his job. Patient also received his second dose of the COVID-19 vaccine yesterday, which he said may have contributed to his exhaustion today.    Speech Therapy Frequency  2x / week    Duration  Other (comment)    Treatment/Interventions  Patient/family education;Language facilitation    Potential to Achieve Goals  Good    Potential Considerations  Ability to learn/carryover information;Family/community support;Previous level of function;Cooperation/participation level    Consulted and Agree with Plan of Care  Patient       Patient will benefit from skilled therapeutic intervention in order to improve the following deficits and impairments:   Aphasia    Problem List Patient Active Problem List   Diagnosis Date Noted  . Depression 01/16/2016  . Cephalalgia 06/03/2015  . Problems influencing health status 06/03/2015  . 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 Angiopathy 06/03/2015  . Completed stroke ([place]) 06/03/2015  . Hypercholesterolemia 06/03/2015    Johnathan Campbell 02/16/2020, 11:00 AM  [place] MAIN [facility] SERVICES [address] [place], [place], [address] Phone: [phone]   Fax:  [phone]   Name: Johnathan Campbell MRN: [id number] Date of Birth: Sep 26, 1961

## 2020-02-21 ENCOUNTER — Ambulatory Visit: Payer: PPO | Admitting: Speech Pathology

## 2020-02-21 ENCOUNTER — Other Ambulatory Visit: Payer: Self-pay

## 2020-02-21 ENCOUNTER — Encounter: Payer: Self-pay | Admitting: Speech Pathology

## 2020-02-21 DIAGNOSIS — R4701 Aphasia: Secondary | ICD-10-CM | POA: Diagnosis not present

## 2020-02-21 NOTE — Therapy (Signed)
[place] MAIN [facility] SERVICES [address] [place], [place], [address] Phone: [phone]   Fax:  [phone]  Speech Language Pathology Treatment  Patient Details  Name: Johnathan Campbell MRN: [id number] Date of Birth: [birth year] Referring Provider (SLP): Dr. Manuella Ghazi   Encounter Date: 02/21/2020  End of Session - 02/21/20 1229    Visit Number  24    Number of Visits  28    Date for SLP Re-Evaluation  03/08/20    Authorization Type  Medicare    Authorization Time Period  Start 02/07/2020    Authorization - Visit Number  4    Authorization - Number of Visits  10    SLP Start Time  1100    SLP Stop Time   1150    SLP Time Calculation (min)  50 min    Activity Tolerance  Patient tolerated treatment well       Past Medical History:  Diagnosis Date  . Acute intractable headache    unspecified headache type  . Allergy   . Aphasia S/P CVA   . Clot    blood clot  . Completed stroke ([place])   . Hypercholesterolemia     Past Surgical History:  Procedure Laterality Date  . HERNIA REPAIR  twice    There were no vitals filed for this visit.  Subjective Assessment - 02/21/20 1228    Subjective  Patient pleasant and engaged.            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 Patient history is significant for aphasia and seizures. Patient received SLP services in 2017 and has returned because he says his speech is "still not where he wants it to be". Patient has been attending teletherapy sessions with the [facility] ([facility]) due to COVID-19, however, he is searching for individual therapy because he wants more intensive treatment.       Cognitive-Linquistic Treatment   Treatment focused on  Aphasia    Skilled Treatment  Write grapheme given phonetic sound with 95% accuracy. Given rhyming task, patient was 91% accurate on auditory discrimination task with words ending in -een and -are. Patient identified 100% of words 1-20 given visual and tactile cues for syllabification. Patient was asked to write sequences of 3 numbers given auditory cues. The patient was 20% accurate on task given 3 numbers in a sequence. Given 2 numbers, the patient was 85% accurate.        Assessment / Recommendations / Plan   Plan  Continue with current plan of care      Progression Toward Goals   Progression toward goals  Progressing toward goals       SLP Education - 02/21/20 1228    Education Details  phonics/auditory comprehension and discrimination    Person(s) Educated  Patient    Methods  Explanation    Comprehension  Verbalized understanding         SLP Long Term Goals - 02/05/20 1348      SLP LONG TERM GOAL #1   Title  Patient will generate grammatical and cogent sentence to complete abstract/comple linguistic task with 80% accuracy.    Status  Partially Met    Target Date  03/08/20  SLP LONG TERM GOAL #2   Title  Patient will read aloud sentences and multi-syllabic words, maintaining phonemic accuracy, with 80% accuracy.      Status  Partially Met    Target Date  03/08/20      SLP LONG TERM GOAL #3   Title  Patient will write grammatical and cogent sentence to complete simple/basic linguistic task with 80% accuracy.    Status  Partially Met    Target Date  03/08/20      SLP LONG TERM GOAL #4   Title  Patient will complete multi-unit processing tasks with 80% accuracy without the need of repetition of task instructions or significant delays in responding.    Status  Partially Met    Target Date  03/08/20      SLP LONG TERM GOAL #5   Title  Patient will demonstrate reading comprehension for paragraphs with 80% accuracy.    Status  Partially Met    Target Date  03/08/20       Plan  - 02/21/20 1229    Clinical Impression Statement  Patient continues to rush through tasks. Given tactile and visual cues to slow down, the patient is much more effective at generating multisyllabic words accurately. 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    Speech Therapy Frequency  2x / week    Duration  Other (comment)    Treatment/Interventions  Patient/family education;Language facilitation    Potential to Achieve Goals  Good    Potential Considerations  Ability to learn/carryover information;Family/community support;Previous level of function;Cooperation/participation level    Consulted and Agree with Plan of Care  Patient       Patient will benefit from skilled therapeutic intervention in order to improve the following deficits and impairments:   Aphasia    Problem List Patient Active Problem List   Diagnosis Date Noted  . Depression 01/16/2016  . Cephalalgia 06/03/2015  . Problems influencing health status 06/03/2015  . 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 Angiopathy 06/03/2015  . Completed stroke ([place]) 06/03/2015  . Hypercholesterolemia 06/03/2015    Thanya Cegielski 02/21/2020, 12:31 PM  [place] MAIN [facility] SERVICES [address] [place], [place], [address] Phone: [phone]   Fax:  [phone]   Name: Johnathan Campbell MRN: [id number] Date of Birth: 1961-02-26

## 2020-02-23 ENCOUNTER — Ambulatory Visit: Payer: PPO | Admitting: Speech Pathology

## 2020-02-23 ENCOUNTER — Encounter: Payer: Self-pay | Admitting: Speech Pathology

## 2020-02-23 ENCOUNTER — Other Ambulatory Visit: Payer: Self-pay

## 2020-02-23 DIAGNOSIS — R4701 Aphasia: Secondary | ICD-10-CM | POA: Diagnosis not present

## 2020-02-23 NOTE — Therapy (Signed)
[place] MAIN [facility] SERVICES [address] [place], [place], [address] Phone: [phone]   Fax:  [phone]  Speech Language Pathology Treatment  Patient Details  Name: Johnathan Campbell MRN: [id number] Date of Birth: 1961-04-17 Referring Provider (SLP): Dr. Manuella Ghazi   Encounter Date: 02/23/2020  End of Session - 02/23/20 1109    Visit Number  25    Number of Visits  28    Date for SLP Re-Evaluation  03/08/20    Authorization Type  Medicare    Authorization Time Period  Start 02/07/2020    Authorization - Visit Number  5    Authorization - Number of Visits  10    SLP Start Time  1000    SLP Stop Time   1050    SLP Time Calculation (min)  50 min    Activity Tolerance  Patient tolerated treatment well       Past Medical History:  Diagnosis Date  . Acute intractable headache    unspecified headache type  . Allergy   . Aphasia S/P CVA   . Clot    blood clot  . Completed stroke ([place])   . Hypercholesterolemia     Past Surgical History:  Procedure Laterality Date  . HERNIA REPAIR  twice    There were no vitals filed for this visit.  Subjective Assessment - 02/23/20 1109    Subjective  Patient pleasant and engaged.            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 Patient history is significant for aphasia and seizures. Patient received SLP services in 2017 and has returned because he says his speech is "still not where he wants it to be". Patient has been attending teletherapy sessions with the [facility] ([facility]) due to COVID-19, however, he is searching for individual therapy because he wants more intensive treatment.       Cognitive-Linquistic Treatment   Treatment focused on  Aphasia    Skilled Treatment  Given two similar words, the patient was 85% accurate on auditory discrimination (wrote down words heard). Patient required visual and verbal cues to aid with auditory discrimination. Patient was able to identify all words with no cues with 100% accuracy immediately after listening/writing task. Patient given related words in monosyllabic, disyllabic and trisyllabic forms and asked to repeat them. Patient was unable to do so without a visual cue, given visual/tactile cue the patient was 80% accurate on correct pronunciation.      Assessment / Recommendations / Plan   Plan  Continue with current plan of care      Progression Toward Goals   Progression toward goals  Progressing toward goals       SLP Education - 02/23/20 1109    Education Details  auditory comprehension and discrimination    Person(s) Educated  Patient    Methods  Explanation    Comprehension  Verbalized understanding         SLP Long Term Goals - 02/05/20 1348      SLP LONG TERM GOAL #1   Title  Patient will generate grammatical and cogent sentence to complete abstract/comple linguistic task with 80% accuracy.    Status  Partially Met    Target Date  03/08/20      SLP LONG TERM GOAL #2   Title  Patient will read aloud sentences and multi-syllabic words, maintaining phonemic accuracy, with 80% accuracy.      Status  Partially Met    Target Date  03/08/20      SLP LONG TERM GOAL #3   Title  Patient will write grammatical and cogent sentence to complete simple/basic linguistic task with 80% accuracy.    Status  Partially Met    Target Date  03/08/20      SLP LONG TERM GOAL #4   Title  Patient will complete multi-unit processing tasks with 80% accuracy without the need of repetition of task instructions or significant delays in responding.    Status  Partially Met    Target Date  03/08/20      SLP LONG TERM GOAL #5   Title  Patient will demonstrate reading comprehension for paragraphs with 80%  accuracy.    Status  Partially Met    Target Date  03/08/20       Plan - 02/23/20 1110    Clinical Impression Statement  Patient continues to rush through tasks. Given tactile and visual cues to slow down, the patient is much more effective at generating multisyllabic words accurately. He continues to present with auditory comprehension difficulties and requires visual, semantic and verbal cues to aid with his comprehension. He does demonstrate improved strategy use on auditory discrimination tasks.    Speech Therapy Frequency  2x / week    Duration  Other (comment)    Treatment/Interventions  Patient/family education;Language facilitation    Potential to Achieve Goals  Good    Potential Considerations  Ability to learn/carryover information;Family/community support;Previous level of function;Cooperation/participation level    SLP Home Exercise Plan  find apps on phone which help with number writing/reading/calculations    Consulted and Agree with Plan of Care  Patient       Patient will benefit from skilled therapeutic intervention in order to improve the following deficits and impairments:   Aphasia    Problem List Patient Active Problem List   Diagnosis Date Noted  . Depression 01/16/2016  . Cephalalgia 06/03/2015  . Problems influencing health status 06/03/2015  . 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 Angiopathy 06/03/2015  . Completed stroke ([place]) 06/03/2015  . Hypercholesterolemia 06/03/2015    Tywanna Seifer 02/23/2020, 11:13 AM  [place] MAIN [facility] SERVICES [address] [place], [place], [address] Phone: [phone]   Fax:  [phone]   Name: Johnathan Campbell MRN: [id number] Date of Birth: 12-06-1960

## 2020-02-28 ENCOUNTER — Other Ambulatory Visit: Payer: Self-pay

## 2020-02-28 ENCOUNTER — Ambulatory Visit: Payer: PPO | Admitting: Speech Pathology

## 2020-02-28 ENCOUNTER — Encounter: Payer: Self-pay | Admitting: Speech Pathology

## 2020-02-28 DIAGNOSIS — R4701 Aphasia: Secondary | ICD-10-CM

## 2020-02-28 NOTE — Therapy (Signed)
[place] MAIN [facility] SERVICES [address] [place], [place], [address] Phone: [phone]   Fax:  [phone]  Speech Language Pathology Treatment  Patient Details  Name: Johnathan Campbell MRN: [id number] Date of Birth: 25-Jul-1961 Referring Provider (SLP): Dr. Manuella Ghazi   Encounter Date: 02/28/2020  End of Session - 02/28/20 1324    Visit Number  26    Number of Visits  28    Date for SLP Re-Evaluation  03/08/20    Authorization Type  Medicare    Authorization Time Period  Start 02/07/2020    Authorization - Visit Number  6    Authorization - Number of Visits  10    SLP Start Time  1100    SLP Stop Time   1155    SLP Time Calculation (min)  55 min    Activity Tolerance  Patient tolerated treatment well       Past Medical History:  Diagnosis Date  . Acute intractable headache    unspecified headache type  . Allergy   . Aphasia S/P CVA   . Clot    blood clot  . Completed stroke ([place])   . Hypercholesterolemia     Past Surgical History:  Procedure Laterality Date  . HERNIA REPAIR  twice    There were no vitals filed for this visit.  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 Patient history is significant for aphasia and seizures. Patient received SLP services in 2017 and has returned because he says his speech is "still not where he wants it to be". Patient has been attending teletherapy sessions with the [facility] ([facility]) due to COVID-19, however, he is searching for individual therapy because he wants more intensive treatment.       Cognitive-Linquistic Treatment   Treatment focused on  Aphasia    Skilled  Treatment  Patient accurately wrote 10 phonetically similar words with 90% accuracy. He was given a reasoning/numerical task and asked to read numbers aloud. He was 90% accurate on reading numbers aloud. Patient was 80% accurate on solving math problem. His written answers were correct but his verbal answers were 0% correct without cues from the clinician.       Assessment / Recommendations / Plan   Plan  Continue with current plan of care      Progression Toward Goals   Progression toward goals  Progressing toward goals       SLP Education - 02/28/20 1323    Education Details  auditory comprehension and discrimination, reading    Person(s) Educated  Patient    Methods  Explanation    Comprehension  Verbalized understanding         SLP Long Term Goals - 02/05/20 1348      SLP LONG TERM GOAL #1   Title  Patient will generate grammatical and cogent sentence to complete abstract/comple linguistic task with 80% accuracy.    Status  Partially Met    Target Date  03/08/20      SLP LONG TERM GOAL #2   Title  Patient will read aloud sentences and multi-syllabic  words, maintaining phonemic accuracy, with 80% accuracy.      Status  Partially Met    Target Date  03/08/20      SLP LONG TERM GOAL #3   Title  Patient will write grammatical and cogent sentence to complete simple/basic linguistic task with 80% accuracy.    Status  Partially Met    Target Date  03/08/20      SLP LONG TERM GOAL #4   Title  Patient will complete multi-unit processing tasks with 80% accuracy without the need of repetition of task instructions or significant delays in responding.    Status  Partially Met    Target Date  03/08/20      SLP LONG TERM GOAL #5   Title  Patient will demonstrate reading comprehension for paragraphs with 80% accuracy.    Status  Partially Met    Target Date  03/08/20       Plan - 02/28/20 1324    Clinical Impression Statement  Patient was in good spirits today and was very  talkative, required cues to attend to tasks. He demonstrates excellent usage of strategies on auditory discrimination tasks and shared examples of usage of these strategies in his daily life. The patient demonstrates improved self-advocacy (ie "Can you say that again?") and slowing down on speech tasks.    Speech Therapy Frequency  2x / week    Duration  Other (comment)    Treatment/Interventions  Patient/family education;Language facilitation    Potential to Achieve Goals  Good    Potential Considerations  Ability to learn/carryover information;Family/community support;Previous level of function;Cooperation/participation level    SLP Home Exercise Plan  find apps on phone which help with number writing/reading/calculations    Consulted and Agree with Plan of Care  Patient       Patient will benefit from skilled therapeutic intervention in order to improve the following deficits and impairments:   Aphasia    Problem List Patient Active Problem List   Diagnosis Date Noted  . Depression 01/16/2016  . Cephalalgia 06/03/2015  . Problems influencing health status 06/03/2015  . 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 Angiopathy 06/03/2015  . Completed stroke ([place]) 06/03/2015  . Hypercholesterolemia 06/03/2015    Nita Whitmire 02/28/2020, 1:29 PM  [place] MAIN [facility] SERVICES [address] [place], [place], [address] Phone: [phone]   Fax:  [phone]   Name: Johnathan Campbell MRN: [id number] Date of Birth: January 27, 1961

## 2020-03-01 ENCOUNTER — Encounter: Payer: Self-pay | Admitting: Speech Pathology

## 2020-03-01 ENCOUNTER — Other Ambulatory Visit: Payer: Self-pay

## 2020-03-01 ENCOUNTER — Ambulatory Visit: Payer: PPO | Admitting: Speech Pathology

## 2020-03-01 DIAGNOSIS — R4701 Aphasia: Secondary | ICD-10-CM

## 2020-03-01 NOTE — Progress Notes (Deleted)
Complete physical exam   Patient: Johnathan Campbell   DOB: 1960/12/09   59 y.o. Male  MRN: [id number] Visit Date: 03/07/2020  Today's healthcare provider: Wilhemena Durie, MD   No chief complaint on file.  Subjective    Johnathan Campbell is a 59 y.o. male who presents today for a complete physical exam.  He reports consuming a {diet types:17450} diet. {Exercise:19826} He generally feels {well/fairly well/poorly:18703}. He reports sleeping {well/fairly well/poorly:18703}. He {does/does not:200015} have additional problems to discuss today.  HPI  ***  Past Medical History:  Diagnosis Date  . Acute intractable headache    unspecified headache type  . Allergy   . Aphasia S/P CVA   . Clot    blood clot  . Completed stroke ([place])   . Hypercholesterolemia    Past Surgical History:  Procedure Laterality Date  . HERNIA REPAIR  twice   Social History   Socioeconomic History  . Marital status: Divorced    Spouse name: Not on file  . Number of children: 3  . Years of education: Not on file  . Highest education level: Bachelor's degree (e.g., BA, AB, BS)  Occupational History  . 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 Smoking status: Never Smoker  . Smokeless tobacco: Never Used  Substance and Sexual Activity  . Alcohol use: Yes    Comment: socially- 1/2 drinks at a time  . Drug use: No  . Sexual activity: Never  Other Topics Concern  . Not on file  Social History Narrative  . Not on file   Social Determinants of Health   Financial Resource Strain:   . Difficulty of Paying Living Expenses:   Food Insecurity:   . Worried About Charity fundraiser in the Last Year:   . Arboriculturist in the Last Year:   Transportation Needs:   . Film/video editor (Medical):   Marland Kitchen Lack of Transportation (Non-Medical):   Physical Activity:   . Days of Exercise per Week:   . Minutes of Exercise per Session:   Stress:   .  Feeling of Stress :   Social Connections:   . Frequency of Communication with Friends and Family:   . Frequency of Social Gatherings with Friends and Family:   . Attends Religious Services:   . Active Member of Clubs or Organizations:   . Attends Archivist Meetings:   Marland Kitchen Marital Status:   Intimate Partner Violence:   . Fear of Current or Ex-Partner:   . Emotionally Abused:   Marland Kitchen Physically Abused:   . Sexually Abused:    Family Status  Relation Name Status  . Mother  Other  . Father  Other   Family History  Problem Relation Age of Onset  . Hypertension Mother   . Hyperlipidemia Mother   . Hypertension Father   . Hyperlipidemia Father    No Known Allergies  Patient Care Team: Jerrol Banana., MD as PCP - General (Family Medicine) Vladimir Crofts, MD as Consulting Physician (Neurology) Dingeldein, Remo Lipps, MD as Consulting Physician (Ophthalmology) Benedetto Goad, RN as Case Manager   Medications: Outpatient Medications Prior to Visit  Medication Sig  . aspirin EC 81 MG tablet Take 81 mg by mouth. Take every other day  . atorvastatin (LIPITOR) 80 MG tablet TAKE 1 TABLET BY MOUTH EVERYDAY AT BEDTIME  . hydrocortisone (PROCTOZONE-HC) 2.5 % rectal  cream Place 1 application rectally 2 (two) times daily. (Patient taking differently: Place 1 application rectally 2 (two) times daily. )  . levETIRAcetam (KEPPRA) 500 MG tablet Take 1 tablet (500 mg total) by mouth 2 (two) times daily.  . montelukast (SINGULAIR) 10 MG tablet TAKE 1 TABLET BY MOUTH EVERYDAY AT BEDTIME  . naproxen (NAPROXEN DR) 500 MG EC tablet Take 1 tablet (500 mg total) by mouth 2 (two) times daily as needed.  . venlafaxine (EFFEXOR) 75 MG tablet TAKE 1 TABLET BY MOUTH TWICE A DAY   No facility-administered medications prior to visit.    Review of Systems  {Show previous labs (optional):23779::" "}  Objective    There were no vitals taken for this visit. {Show previous vital signs  (optional):23777::" "}  Physical Exam  ***  Depression Screen  PHQ 2/9 Scores 02/02/2019 07/15/2018 12/07/2017  PHQ - 2 Score 0 1 2  PHQ- 9 Score 0 - 6    No results found for any visits on 03/07/20.  Assessment & Plan    Routine Health Maintenance and Physical Exam  Exercise Activities and Dietary recommendations Goals    . Increase water intake     Continue drinking 6-8 glasses of water a day.       Immunization History  Administered Date(s) Administered  . Influenza,inj,Quad PF,6+ Mos 07/15/2015, 07/23/2016, 07/13/2017, 07/15/2018  . Td 03/31/2005  . Tdap 08/22/2017, 09/10/2018    Health Maintenance  Topic Date Due  . COLONOSCOPY  02/14/2019  . INFLUENZA VACCINE  06/02/2020  . TETANUS/TDAP  09/10/2028  . Hepatitis C Screening  Completed  . HIV Screening  Completed    Discussed health benefits of physical activity, and encouraged him to engage in regular exercise appropriate for his age and condition.  ***  No follow-ups on file.     {provider attestation***:1}   Wilhemena Durie, MD  [address] [phone] (phone) [phone] (fax)  [place]

## 2020-03-01 NOTE — Therapy (Signed)
[place] MAIN [facility] SERVICES [address] [place], [place], [address] Phone: [phone]   Fax:  [phone]  Speech Language Pathology Treatment  Patient Details  Name: Johnathan Campbell MRN: [id number] Date of Birth: October 08, 1961 Referring Provider (SLP): Dr. Manuella Ghazi   Encounter Date: 03/01/2020  End of Session - 03/01/20 1104    Visit Number  27    Number of Visits  28    Date for SLP Re-Evaluation  03/08/20    Authorization Type  Medicare    Authorization Time Period  Start 02/07/2020    Authorization - Visit Number  7    Authorization - Number of Visits  10    SLP Start Time  1000    SLP Stop Time   1055    SLP Time Calculation (min)  55 min    Activity Tolerance  Patient tolerated treatment well       Past Medical History:  Diagnosis Date  . Acute intractable headache    unspecified headache type  . Allergy   . Aphasia S/P CVA   . Clot    blood clot  . Completed stroke ([place])   . Hypercholesterolemia     Past Surgical History:  Procedure Laterality Date  . HERNIA REPAIR  twice    There were no vitals filed for this visit.  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 Patient history is significant for aphasia and seizures. Patient received SLP services in 2017 and has returned because he says his speech is "still not where he wants it to be". Patient has been attending teletherapy sessions with the [facility] ([facility]) due to COVID-19, however, he is searching for individual therapy because he wants more intensive treatment.       Cognitive-Linquistic Treatment   Treatment focused on  Aphasia     Skilled Treatment  Patient repeated phonetically similar words with 80% accuracy.  Patient was given 3 sets of 10 words delivered auditorily and asked to determine if the words were same/different. Patient was 70% accurate. Given a repetition of the stimulus the patient was 86% accurate. Patient prompted to repeat trisyllabic words after clinician model. He was 50% accurate. Given max cues (written prompt, prompts to slow down, direct model) patient was 100% accurate on production of 20 trisyllabic words.      Assessment / Recommendations / Plan   Plan  Continue with current plan of care      Progression Toward Goals   Progression toward goals  Progressing toward goals       SLP Education - 03/01/20 1104    Education Details  auditory comprehension and discrimination    Person(s) Educated  Patient    Methods  Explanation    Comprehension  Verbalized understanding         SLP Long Term Goals - 02/05/20 1348      SLP LONG TERM GOAL #1   Title  Patient will generate grammatical and cogent sentence to complete abstract/comple linguistic task with 80% accuracy.    Status  Partially Met    Target Date  03/08/20  SLP LONG TERM GOAL #2   Title  Patient will read aloud sentences and multi-syllabic words, maintaining phonemic accuracy, with 80% accuracy.      Status  Partially Met    Target Date  03/08/20      SLP LONG TERM GOAL #3   Title  Patient will write grammatical and cogent sentence to complete simple/basic linguistic task with 80% accuracy.    Status  Partially Met    Target Date  03/08/20      SLP LONG TERM GOAL #4   Title  Patient will complete multi-unit processing tasks with 80% accuracy without the need of repetition of task instructions or significant delays in responding.    Status  Partially Met    Target Date  03/08/20      SLP LONG TERM GOAL #5   Title  Patient will demonstrate reading comprehension for paragraphs with 80% accuracy.    Status  Partially Met     Target Date  03/08/20       Plan - 03/01/20 1105    Clinical Impression Statement  Patient demonstrates excellent usage of strategies on auditory discrimination tasks and shared examples of usage of these strategies in his daily life, still requires cues to slow down. The patient demonstrates improved self-advocacy (ie "Can you say that again?") and responds well to written/verbal cues. Patient was able to provide examples of how to use the strategies learned in therapy in his daily life.    Speech Therapy Frequency  2x / week    Duration  Other (comment)    Treatment/Interventions  Patient/family education;Language facilitation    Potential to Achieve Goals  Good    Potential Considerations  Ability to learn/carryover information;Family/community support;Previous level of function;Cooperation/participation level    SLP Home Exercise Plan  find apps on phone which help with number writing/reading/calculations    Consulted and Agree with Plan of Care  Patient       Patient will benefit from skilled therapeutic intervention in order to improve the following deficits and impairments:   Aphasia    Problem List Patient Active Problem List   Diagnosis Date Noted  . Depression 01/16/2016  . Cephalalgia 06/03/2015  . Problems influencing health status 06/03/2015  . 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 Angiopathy 06/03/2015  . Completed stroke ([place]) 06/03/2015  . Hypercholesterolemia 06/03/2015    Johnathan Campbell 03/01/2020, 11:06 AM  [place] MAIN [facility] SERVICES [address] [place], [place], [address] Phone: [phone]   Fax:  [phone]   Name: Johnathan Campbell MRN: [id number] Date of Birth: 1961-01-22

## 2020-03-04 ENCOUNTER — Ambulatory Visit: Payer: PPO | Attending: Neurology | Admitting: Speech Pathology

## 2020-03-04 ENCOUNTER — Encounter: Payer: PPO | Admitting: Speech Pathology

## 2020-03-04 DIAGNOSIS — R4701 Aphasia: Secondary | ICD-10-CM | POA: Insufficient documentation

## 2020-03-05 ENCOUNTER — Encounter: Payer: Self-pay | Admitting: Speech Pathology

## 2020-03-05 ENCOUNTER — Ambulatory Visit: Payer: PPO | Admitting: Speech Pathology

## 2020-03-05 ENCOUNTER — Other Ambulatory Visit: Payer: Self-pay

## 2020-03-05 DIAGNOSIS — R4701 Aphasia: Secondary | ICD-10-CM | POA: Diagnosis not present

## 2020-03-05 NOTE — Therapy (Signed)
[place] MAIN [facility] SERVICES [address] [place], [place], [address] Phone: [phone]   Fax:  [phone]  Speech Language Pathology Treatment  Patient Details  Name: Johnathan Campbell MRN: [id number] Date of Birth: April 15, 1961 Referring Provider (SLP): Dr. Manuella Ghazi   Encounter Date: 03/05/2020  End of Session - 03/05/20 1128    Visit Number  28    Number of Visits  28    Date for SLP Re-Evaluation  03/08/20    Authorization Type  Medicare    Authorization Time Period  Start 02/07/2020    Authorization - Visit Number  8    Authorization - Number of Visits  10    SLP Start Time  1000    SLP Stop Time   1055    SLP Time Calculation (min)  55 min    Activity Tolerance  Patient tolerated treatment well       Past Medical History:  Diagnosis Date  . Acute intractable headache    unspecified headache type  . Allergy   . Aphasia S/P CVA   . Clot    blood clot  . Completed stroke ([place])   . Hypercholesterolemia     Past Surgical History:  Procedure Laterality Date  . HERNIA REPAIR  twice    There were no vitals filed for this visit.  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 Patient history is significant for aphasia and seizures. Patient received SLP services in 2017 and has returned because he says his speech is "still not where he wants it to be". Patient has been attending teletherapy sessions with the [facility] (T[facility]) due to COVID-19, however, he is searching for individual therapy because he wants more intensive treatment.       Cognitive-Linquistic Treatment   Treatment focused on  Aphasia    Skilled  Treatment  Patient repeated 50 phonetically similar words with 74% accuracy.  Patient was given 2 sets of 10 words delivered auditorily and asked to determine if the words were same/different. Patient was 80% accurate. Patient educated on strategies for partner communication and managing frustration. Patient identified 5/5 strategies specifically targetted in therapy and identified personally salient ways to utilize strategies in his daily life.       Assessment / Recommendations / Plan   Plan  Continue with current plan of care      Progression Toward Goals   Progression toward goals  Progressing toward goals       SLP Education - 03/05/20 1128    Education Details  auditory comprehension and discrimination    Person(s) Educated  Patient    Methods  Explanation    Comprehension  Verbalized understanding         SLP Long Term Goals - 02/05/20 1348      SLP LONG TERM GOAL #1   Title  Patient will generate grammatical and cogent sentence to complete abstract/comple linguistic task with 80% accuracy.    Status  Partially Met    Target Date  03/08/20      SLP LONG TERM GOAL #2   Title  Patient will read aloud sentences and multi-syllabic words, maintaining phonemic accuracy, with 80% accuracy.      Status  Partially Met    Target Date  03/08/20      SLP LONG TERM GOAL #3   Title  Patient will write grammatical and cogent sentence to complete simple/basic linguistic task with 80% accuracy.    Status  Partially Met    Target Date  03/08/20      SLP LONG TERM GOAL #4   Title  Patient will complete multi-unit processing tasks with 80% accuracy without the need of repetition of task instructions or significant delays in responding.    Status  Partially Met    Target Date  03/08/20      SLP LONG TERM GOAL #5   Title  Patient will demonstrate reading comprehension for paragraphs with 80% accuracy.    Status  Partially Met    Target Date  03/08/20       Plan - 03/05/20 1129     Clinical Impression Statement  Patient demonstrates excellent usage of strategies on auditory discrimination tasks and shared examples of usage of these strategies in his daily life, still requires cues to slow down. The patient demonstrates improved self-advocacy (ie "Can you say that again?") and responds well to written/verbal cues. Patient was able to provide examples of how to use the strategies learned in therapy in his daily life.    Speech Therapy Frequency  2x / week    Duration  Other (comment)    Treatment/Interventions  Patient/family education;Language facilitation    Potential to Achieve Goals  Good    Potential Considerations  Ability to learn/carryover information;Family/community support;Previous level of function;Cooperation/participation level    SLP Home Exercise Plan  find apps on phone which help with number writing/reading/calculations    Consulted and Agree with Plan of Care  Patient       Patient will benefit from skilled therapeutic intervention in order to improve the following deficits and impairments:   Aphasia    Problem List Patient Active Problem List   Diagnosis Date Noted  . Depression 01/16/2016  . Cephalalgia 06/03/2015  . Problems influencing health status 06/03/2015  . 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 Angiopathy 06/03/2015  . Completed stroke ([place]) 06/03/2015  . Hypercholesterolemia 06/03/2015    Johnathan Campbell 03/05/2020, 11:29 AM  [place] MAIN [facility] SERVICES [address] [place], [place], [address] Phone: [phone]   Fax:  [phone]   Name: Johnathan Campbell MRN: [id number] Date of Birth: Sep 28, 1961

## 2020-03-06 ENCOUNTER — Encounter: Payer: PPO | Admitting: Speech Pathology

## 2020-03-07 ENCOUNTER — Ambulatory Visit: Payer: PPO | Admitting: Speech Pathology

## 2020-03-07 ENCOUNTER — Encounter: Payer: Self-pay | Admitting: Speech Pathology

## 2020-03-07 ENCOUNTER — Other Ambulatory Visit: Payer: Self-pay

## 2020-03-07 ENCOUNTER — Encounter: Payer: Self-pay | Admitting: Family Medicine

## 2020-03-07 DIAGNOSIS — R4701 Aphasia: Secondary | ICD-10-CM | POA: Diagnosis not present

## 2020-03-07 NOTE — Therapy (Signed)
[place] MAIN [facility] SERVICES [address] [place], [place], [address] Phone: [phone]   Fax:  [phone]  Speech Language Pathology Treatment/Re-Certification  Patient Details  Name: Johnathan Campbell MRN: [id number] Date of Birth: 1961/10/18 Referring Provider (SLP): Dr. Manuella Ghazi   Encounter Date: 03/07/2020  End of Session - 03/07/20 1512    Visit Number  29    Number of Visits  36    Date for SLP Re-Evaluation  04/25/20    Authorization Type  Medicare    Authorization Time Period  Start 02/07/2020    Authorization - Visit Number  9    Authorization - Number of Visits  10    SLP Start Time  1000    SLP Stop Time   1050    SLP Time Calculation (min)  50 min    Activity Tolerance  Patient tolerated treatment well       Past Medical History:  Diagnosis Date  . Acute intractable headache    unspecified headache type  . Allergy   . Aphasia S/P CVA   . Clot    blood clot  . Completed stroke ([place])   . Hypercholesterolemia     Past Surgical History:  Procedure Laterality Date  . HERNIA REPAIR  twice    There were no vitals filed for this visit.  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 Patient history is significant for aphasia and seizures. Patient received SLP services in 2017 and has returned because he says his speech is "still not where he wants it to be". Patient has been attending teletherapy sessions with the [facility] ([facility]) due to COVID-19, however, he is searching for individual therapy because he wants more intensive treatment.       Treatment Provided   Treatment provided   Cognitive-Linquistic      Pain Assessment   Pain Assessment  No/denies pain      Cognitive-Linquistic Treatment   Treatment focused on  Aphasia    Skilled Treatment  AUDITORY DISCRIMINATION: Write letter that coincides with sound in isolation with 80% accuracy (95% given repetition of sound). Answer moderately complex yes/no questions given VERBAL EXPRESSION: Name pictured items with 75% accuracy (errors primarily apraxic/paraphasic).  Patient easily completed visual/pictured analogies.  Read aloud 3-syllable words with 70% accuracy.  Errors include omitting syllables, phonemic paraphasia, and incorrect emphasis.      Assessment / Recommendations / Plan   Plan  Continue with current plan of care      Progression Toward Goals   Progression toward goals  Progressing toward goals       SLP Education - 03/07/20 1511    Education Details  Slow speech to improve intelligibility    Person(s) Educated  Patient    Methods  Explanation    Comprehension  Verbalized understanding         SLP Long Term Goals - 03/07/20 1514      SLP LONG TERM GOAL #1   Title  Patient will generate grammatical and cogent sentence to complete abstract/comple linguistic task with 80% accuracy.    Time  8    Period  Weeks    Status  Partially Met    Target Date  04/25/20      SLP LONG TERM GOAL #2   Title  Patient will read aloud sentences and multi-syllabic words, maintaining phonemic accuracy, with 80% accuracy.      Time  8    Period  Weeks    Status  Partially Met    Target Date  04/25/20      SLP LONG TERM GOAL #3   Title  Patient will write grammatical and cogent sentence to complete simple/basic linguistic task with 80% accuracy.    Time  8    Period  Weeks    Status  Partially Met    Target Date  04/25/20      SLP LONG TERM GOAL #4   Title  Patient will complete multi-unit processing tasks with 80% accuracy without the need of repetition of task instructions or significant delays in  responding.    Time  8    Period  Weeks    Status  Partially Met    Target Date  04/25/20      SLP LONG TERM GOAL #5   Title  Patient will demonstrate reading comprehension for paragraphs with 80% accuracy.    Time  8    Period  Weeks    Status  Partially Met    Target Date  04/25/20       Plan - 03/07/20 1514    Clinical Impression Statement  Patient demonstrates excellent usage of strategies on auditory discrimination tasks and shared examples of usage of these strategies in his daily life, still requires cues to slow down. The patient demonstrates improved self-advocacy (ie "Can you say that again?") and responds well to written/verbal cues. Patient was able to provide examples of how to use the strategies learned in therapy in his daily life.    Speech Therapy Frequency  2x / week    Duration  Other (comment)   8 weeks   Treatment/Interventions  Patient/family education;Language facilitation    Potential to Achieve Goals  Good    Potential Considerations  Ability to learn/carryover information;Family/community support;Previous level of function;Cooperation/participation level    Consulted and Agree with Plan of Care  Patient       Patient will benefit from skilled therapeutic intervention in order to improve the following deficits and impairments:   Aphasia - Plan: SLP plan of care cert/re-cert    Problem List Patient Active Problem List   Diagnosis Date Noted  . Depression 01/16/2016  . Cephalalgia 06/03/2015  . Problems influencing health status 06/03/2015  . 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 Angiopathy 06/03/2015  . Completed stroke ([place]) 06/03/2015  . Hypercholesterolemia 06/03/2015   Johnathan Sea, MS/CCC- SLP  Johnathan Campbell 03/07/2020, 3:17 PM  [place] MAIN [facility] SERVICES [address] [place], [place], [address] Phone: [phone]   Fax:  [phone]   Name: Johnathan Campbell MRN: [id number] Date of Birth: [birth year]

## 2020-03-11 ENCOUNTER — Ambulatory Visit: Payer: PPO | Admitting: Speech Pathology

## 2020-03-11 ENCOUNTER — Other Ambulatory Visit: Payer: Self-pay

## 2020-03-11 DIAGNOSIS — R4701 Aphasia: Secondary | ICD-10-CM | POA: Diagnosis not present

## 2020-03-12 ENCOUNTER — Encounter: Payer: Self-pay | Admitting: Speech Pathology

## 2020-03-12 NOTE — Therapy (Signed)
[place] MAIN [facility] SERVICES [address] [place], [place], [address] Phone: [phone]   Fax:  [phone]  Speech Language Pathology Treatment/Progress Note  Speech Therapy Progress Note   Dates of reporting period  02/07/2020   to   03/11/2020   Patient Details  Name: Johnathan Campbell MRN: [id number] Date of Birth: 29-Dec-1960 Referring Provider (SLP): Dr. Manuella Ghazi   Encounter Date: 03/11/2020  End of Session - 03/12/20 1351    Visit Number  30    Number of Visits  44    Date for SLP Re-Evaluation  04/25/20    Authorization Type  Medicare    Authorization Time Period  Start 02/07/2020    Authorization - Visit Number  10    Authorization - Number of Visits  10    SLP Start Time  1500    SLP Stop Time   1550    SLP Time Calculation (min)  50 min    Activity Tolerance  Patient tolerated treatment well       Past Medical History:  Diagnosis Date  . Acute intractable headache    unspecified headache type  . Allergy   . Aphasia S/P CVA   . Clot    blood clot  . Completed stroke ([place])   . Hypercholesterolemia     Past Surgical History:  Procedure Laterality Date  . HERNIA REPAIR  twice    There were no vitals filed for this visit.  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 Patient history is significant for aphasia and seizures. Patient received SLP services in 2017 and has returned because he says his speech is "still not where he wants it to be". Patient has been attending teletherapy sessions with the [facility] (T[facility]) due to COVID-19, however, he is searching for individual therapy because he wants  more intensive treatment.       Treatment Provided   Treatment provided  Cognitive-Linquistic      Pain Assessment   Pain Assessment  No/denies pain      Cognitive-Linquistic Treatment   Treatment focused on  Aphasia    Skilled Treatment  AUDITORY DISCRIMINATION: Imitate rhymes and 3-syllable words with 50% accuracy on first try.  Patient typically improves imitation once he can define the word.  VERBAL EXPRESSION: Patient requires mod cues to provide complete information in conversational speech.  READING:  Read aloud complex sentences with 80% fluency, demonstrate comprehension with 80% accuracy.  VISUAL REASONING: Complete pictural analogies with 90% accuracy.        Assessment / Recommendations / Plan   Plan  Continue with current plan of care      Progression Toward Goals   Progression toward goals  Progressing toward goals       SLP Education - 03/12/20 1350    Education Details  Listening skills are paramount for effective communication    Person(s) Educated  Patient    Methods  Explanation    Comprehension  Verbalized understanding         SLP Long Term Goals - 03/12/20 1353      SLP  LONG TERM GOAL #1   Title  Patient will generate grammatical and cogent sentence to complete abstract/comple linguistic task with 80% accuracy.    Status  Partially Met    Target Date  04/25/20      SLP LONG TERM GOAL #2   Title  Patient will read aloud sentences and multi-syllabic words, maintaining phonemic accuracy, with 80% accuracy.      Status  Partially Met    Target Date  04/25/20      SLP LONG TERM GOAL #3   Title  Patient will write grammatical and cogent sentence to complete simple/basic linguistic task with 80% accuracy.    Status  Partially Met    Target Date  04/25/20      SLP LONG TERM GOAL #4   Title  Patient will complete multi-unit processing tasks with 80% accuracy without the need of repetition of task instructions or significant delays in responding.    Status   Partially Met    Target Date  04/25/20      SLP LONG TERM GOAL #5   Title  Patient will demonstrate reading comprehension for paragraphs with 80% accuracy.    Status  Partially Met    Target Date  04/25/20       Plan - 03/12/20 1352    Clinical Impression Statement  Patient demonstrates excellent usage of strategies on auditory discrimination tasks and shared examples of usage of these strategies in his daily life, still requires cues to slow down. The patient demonstrates improved self-advocacy (ie "Can you say that again?") and responds well to written/verbal cues. Patient was able to provide examples of how to use the strategies learned in therapy in his daily life.    Speech Therapy Frequency  2x / week    Duration  Other (comment)    Treatment/Interventions  Patient/family education;Language facilitation    Potential to Achieve Goals  Good    Potential Considerations  Ability to learn/carryover information;Family/community support;Previous level of function;Cooperation/participation level    Consulted and Agree with Plan of Care  Patient       Patient will benefit from skilled therapeutic intervention in order to improve the following deficits and impairments:   Aphasia    Problem List Patient Active Problem List   Diagnosis Date Noted  . Depression 01/16/2016  . Cephalalgia 06/03/2015  . Problems influencing health status 06/03/2015  . 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 Angiopathy 06/03/2015  . Completed stroke ([place]) 06/03/2015  . Hypercholesterolemia 06/03/2015   [name], MS/CCC- SLP  Lou Miner 03/12/2020, 1:55 PM  [place] MAIN [facility] SERVICES [address] [place], [place], [address] Phone: [phone]   Fax:  [phone]   Name: Johnathan Campbell MRN: [id number] Date of Birth: November 02, 1961

## 2020-03-13 ENCOUNTER — Encounter: Payer: PPO | Admitting: Speech Pathology

## 2020-03-13 ENCOUNTER — Encounter: Payer: Self-pay | Admitting: Speech Pathology

## 2020-03-13 ENCOUNTER — Other Ambulatory Visit: Payer: Self-pay

## 2020-03-13 ENCOUNTER — Ambulatory Visit: Payer: PPO | Admitting: Speech Pathology

## 2020-03-13 DIAGNOSIS — R4701 Aphasia: Secondary | ICD-10-CM | POA: Diagnosis not present

## 2020-03-13 NOTE — Therapy (Signed)
[place] MAIN [facility] SERVICES [address] [place], [place], [address] Phone: [phone]   Fax:  [phone]  Speech Language Pathology Treatment  Patient Details  Name: Johnathan Campbell MRN: [id number] Date of Birth: April 18, 1961 Referring Provider (SLP): Dr. Manuella Ghazi   Encounter Date: 03/13/2020  End of Session - 03/13/20 1619    Visit Number  31    Number of Visits  74    Date for SLP Re-Evaluation  04/25/20    Authorization Type  Medicare    Authorization Time Period  Start 03/13/2020    Authorization - Visit Number  1    Authorization - Number of Visits  10    SLP Start Time  1700    SLP Stop Time   1605    SLP Time Calculation (min)  50 min    Activity Tolerance  Patient tolerated treatment well       Past Medical History:  Diagnosis Date  . Acute intractable headache    unspecified headache type  . Allergy   . Aphasia S/P CVA   . Clot    blood clot  . Completed stroke ([place])   . Hypercholesterolemia     Past Surgical History:  Procedure Laterality Date  . HERNIA REPAIR  twice    There were no vitals filed for this visit.  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 Patient history is significant for aphasia and seizures. Patient received SLP services in 2017 and has returned because he says his speech is "still not where he wants it to be". Patient has been attending teletherapy sessions with the [facility] (T[facility]) due to COVID-19, however, he is searching for individual therapy because he wants more intensive treatment.       Treatment Provided   Treatment  provided  Cognitive-Linquistic      Pain Assessment   Pain Assessment  No/denies pain      Cognitive-Linquistic Treatment   Treatment focused on  Aphasia    Skilled Treatment  VERBAL REASONING: Read aloud definitions and clues with 70% fluency.  Write word given definition with 90% accuracy given min cues.  Complete deduction puzzles given mod-max cues to understand task, reason through the clues, and accurately read the clue.      Assessment / Recommendations / Plan   Plan  Continue with current plan of care      Progression Toward Goals   Progression toward goals  Progressing toward goals       SLP Education - 03/13/20 1618    Education Details  Tolerate the struggle to explore assets and defects to improve overall communication    Person(s) Educated  Patient    Methods  Explanation    Comprehension  Verbalized understanding         SLP Long Term Goals - 03/12/20 1353      SLP LONG TERM GOAL #1   Title  Patient will generate grammatical and cogent sentence to complete abstract/comple linguistic task with 80% accuracy.    Status  Partially Met    Target Date  04/25/20      SLP LONG TERM GOAL #2   Title  Patient will read aloud sentences and multi-syllabic words, maintaining phonemic accuracy, with 80% accuracy.      Status  Partially Met    Target Date  04/25/20      SLP LONG TERM GOAL #3   Title  Patient will write grammatical and cogent sentence to complete simple/basic linguistic task with 80% accuracy.    Status  Partially Met    Target Date  04/25/20      SLP LONG TERM GOAL #4   Title  Patient will complete multi-unit processing tasks with 80% accuracy without the need of repetition of task instructions or significant delays in responding.    Status  Partially Met    Target Date  04/25/20      SLP LONG TERM GOAL #5   Title  Patient will demonstrate reading comprehension for paragraphs with 80% accuracy.    Status  Partially Met    Target Date  04/25/20        Plan - 03/13/20 1620    Clinical Impression Statement  The patient is making gains in all areas of communication.  He was frustrated with today's task, which are much more abstract/complex, and persevered.    Speech Therapy Frequency  2x / week    Duration  Other (comment)    Treatment/Interventions  Patient/family education;Language facilitation    Potential to Achieve Goals  Good    Potential Considerations  Ability to learn/carryover information;Family/community support;Previous level of function;Cooperation/participation level    Consulted and Agree with Plan of Care  Patient       Patient will benefit from skilled therapeutic intervention in order to improve the following deficits and impairments:   Aphasia    Problem List Patient Active Problem List   Diagnosis Date Noted  . Depression 01/16/2016  . Cephalalgia 06/03/2015  . Problems influencing health status 06/03/2015  . 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 Angiopathy 06/03/2015  . Completed stroke ([place]) 06/03/2015  . Hypercholesterolemia 06/03/2015   [name], MS/CCC- SLP  Lou Miner 03/13/2020, 4:22 PM  [place] MAIN [facility] SERVICES [address] [place], [place], [address] Phone: [phone]   Fax:  [phone]   Name: RYDELL WIEGEL MRN: [id number] Date of Birth: [birth year]

## 2020-03-15 NOTE — Progress Notes (Signed)
Trena Platt Cummings,acting as a scribe for Wilhemena Durie, MD.,have documented all relevant documentation on the behalf of Wilhemena Durie, MD,as directed by  Wilhemena Durie, MD while in the presence of Wilhemena Durie, MD.  Complete physical exam   Patient: Johnathan Campbell   DOB: [birth year]   59 y.o. Male  MRN: [id number] Visit Date: 03/19/2020  Today's healthcare provider: Wilhemena Durie, MD   Chief Complaint  Patient presents with  . Annual Exam   Subjective    Johnathan Campbell is a 59 y.o. male who presents today for a complete physical exam.  He reports consuming a low fat and low sodium diet. Home exercise routine includes yardwork. He generally feels well. He reports sleeping well. He does not have additional problems to discuss today.  He takes his medications as prescribed. HPI    Past Medical History:  Diagnosis Date  . Acute intractable headache    unspecified headache type  . Allergy   . Aphasia S/P CVA   . Clot    blood clot  . Completed stroke ([place])   . Hypercholesterolemia    Past Surgical History:  Procedure Laterality Date  . HERNIA REPAIR  twice   Social History   Socioeconomic History  . Marital status: Divorced    Spouse name: Not on file  . Number of children: 3  . Years of education: Not on file  . Highest education level: Bachelor's degree (e.g., BA, AB, BS)  Occupational History  . 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 Smoking status: Never Smoker  . Smokeless tobacco: Never Used  Substance and Sexual Activity  . Alcohol use: Yes    Comment: socially- 1/2 drinks at a time  . Drug use: No  . Sexual activity: Never  Other Topics Concern  . Not on file  Social History Narrative  . Not on file   Social Determinants of Health   Financial Resource Strain:   . Difficulty of Paying Living Expenses:   Food Insecurity:   . Worried About Charity fundraiser in the Last  Year:   . Arboriculturist in the Last Year:   Transportation Needs:   . Film/video editor (Medical):   Marland Kitchen Lack of Transportation (Non-Medical):   Physical Activity:   . Days of Exercise per Week:   . Minutes of Exercise per Session:   Stress:   . Feeling of Stress :   Social Connections:   . Frequency of Communication with Friends and Family:   . Frequency of Social Gatherings with Friends and Family:   . Attends Religious Services:   . Active Member of Clubs or Organizations:   . Attends Archivist Meetings:   Marland Kitchen Marital Status:   Intimate Partner Violence:   . Fear of Current or Ex-Partner:   . Emotionally Abused:   Marland Kitchen Physically Abused:   . Sexually Abused:    Family Status  Relation Name Status  . Mother  Other  . Father  Other   Family History  Problem Relation Age of Onset  . Hypertension Mother   . Hyperlipidemia Mother   . Hypertension Father   . Hyperlipidemia Father    No Known Allergies  Patient Care Team: Jerrol Banana., MD as PCP - General (Family Medicine) Vladimir Crofts, MD as Consulting Physician (Neurology) Dingeldein, Remo Lipps, MD as Consulting Physician (Ophthalmology)  Benedetto Goad, RN as Case Manager   Medications: Outpatient Medications Prior to Visit  Medication Sig  . aspirin EC 81 MG tablet Take 81 mg by mouth. Take every other day  . atorvastatin (LIPITOR) 80 MG tablet TAKE 1 TABLET BY MOUTH EVERYDAY AT BEDTIME  . hydrocortisone (PROCTOZONE-HC) 2.5 % rectal cream Place 1 application rectally 2 (two) times daily. (Patient taking differently: Place 1 application rectally 2 (two) times daily. )  . lamoTRIgine (LAMICTAL) 25 MG tablet 75 mg 2 (two) times daily.   Marland Kitchen levETIRAcetam (KEPPRA) 500 MG tablet Take 1 tablet (500 mg total) by mouth 2 (two) times daily.  . montelukast (SINGULAIR) 10 MG tablet TAKE 1 TABLET BY MOUTH EVERYDAY AT BEDTIME  . naproxen (NAPROXEN DR) 500 MG EC tablet Take 1 tablet (500 mg total) by mouth 2  (two) times daily as needed.  . venlafaxine (EFFEXOR) 75 MG tablet TAKE 1 TABLET BY MOUTH TWICE A DAY   No facility-administered medications prior to visit.    Review of Systems  Constitutional: Negative.   HENT: Positive for congestion and postnasal drip.   Eyes: Negative.   Respiratory: Negative.   Cardiovascular: Negative.   Gastrointestinal: Negative.   Endocrine: Negative.   Genitourinary: Negative.   Musculoskeletal: Negative.   Skin: Negative.   Allergic/Immunologic: Positive for environmental allergies.  Neurological: Negative.   Hematological: Negative.   Psychiatric/Behavioral: Negative.        Objective    BP (!) 146/91 (BP Location: Right Arm, Patient Position: Sitting, Cuff Size: Normal)   Pulse 81   Temp (!) 96.9 F (36.1 C) (Temporal)   Ht 5\' 10"  (1.778 m)   Wt 175 lb 6.4 oz (79.6 kg)   BMI 25.17 kg/m  BP Readings from Last 3 Encounters:  03/19/20 (!) 146/91  08/08/19 122/84  02/02/19 124/78   Wt Readings from Last 3 Encounters:  03/19/20 175 lb 6.4 oz (79.6 kg)  08/08/19 175 lb 9.6 oz (79.7 kg)  02/02/19 180 lb (81.6 kg)      Physical Exam Vitals reviewed.  Constitutional:      Appearance: Normal appearance.  HENT:     Head: Normocephalic and atraumatic.     Right Ear: Tympanic membrane normal. There is no impacted cerumen.     Left Ear: Tympanic membrane normal. There is no impacted cerumen.  Eyes:     General: No scleral icterus.    Conjunctiva/sclera: Conjunctivae normal.  Neck:     Vascular: No carotid bruit.  Cardiovascular:     Rate and Rhythm: Normal rate and regular rhythm.     Pulses: Normal pulses.     Heart sounds: Normal heart sounds.  Pulmonary:     Effort: Pulmonary effort is normal.     Breath sounds: Normal breath sounds.  Musculoskeletal:     Cervical back: Normal range of motion.     Right lower leg: No edema.     Left lower leg: No edema.  Skin:    General: Skin is warm and dry.  Neurological:     Mental  Status: He is alert and oriented to person, place, and time. Mental status is at baseline.  Psychiatric:        Mood and Affect: Mood normal.        Behavior: Behavior normal.        Thought Content: Thought content normal.        Judgment: Judgment normal.       Depression Screen  PHQ 2/9  Scores 02/02/2019 07/15/2018 12/07/2017  PHQ - 2 Score 0 1 2  PHQ- 9 Score 0 - 6    No results found for any visits on 03/19/20.  Assessment & Plan    Routine Health Maintenance and Physical Exam  Exercise Activities and Dietary recommendations Goals    . Increase water intake     Continue drinking 6-8 glasses of water a day.       Immunization History  Administered Date(s) Administered  . Influenza,inj,Quad PF,6+ Mos 07/15/2015, 07/23/2016, 07/13/2017, 07/15/2018  . Td 03/31/2005  . Tdap 08/22/2017, 09/10/2018    Health Maintenance  Topic Date Due  . COLONOSCOPY  02/14/2019  . INFLUENZA VACCINE  06/02/2020  . TETANUS/TDAP  09/10/2028  . Hepatitis C Screening  Completed  . HIV Screening  Completed    Discussed health benefits of physical activity, and encouraged him to engage in regular exercise appropriate for his age and condition. 1. Annual physical exam He has had both Covid vaccines.  2. Hypercholesterolemia On atorvastatin. - Lipid panel - TSH - CBC with Differential/Platelet - Comprehensive metabolic panel - PSA - POCT urinalysis dipstick - losartan (COZAAR) 50 MG tablet; Take 1 tablet (50 mg total) by mouth daily.  Dispense: 30 tablet; Refill: 1  3. Hypertension, unspecified type Add losartan 50 mg daily.  Return to clinic 1 to 2 months. - Lipid panel - TSH - CBC with Differential/Platelet - Comprehensive metabolic panel - PSA - POCT urinalysis dipstick - losartan (COZAAR) 50 MG tablet; Take 1 tablet (50 mg total) by mouth daily.  Dispense: 30 tablet; Refill: 1  4. Completed stroke ([place]) Mild expressive aphasia still present but much improved.  Risk factors  for future  stroke treated - Lipid panel - TSH - CBC with Differential/Platelet - Comprehensive metabolic panel - PSA - POCT urinalysis dipstick - losartan (COZAAR) 50 MG tablet; Take 1 tablet (50 mg total) by mouth daily.  Dispense: 30 tablet; Refill: 1     No follow-ups on file.     I, Wilhemena Durie, MD, have reviewed all documentation for this visit. The documentation on 03/23/20 for the exam, diagnosis, procedures, and orders are all accurate and complete.    Allure Greaser Cranford Mon, MD  [facility] [phone] (phone) [phone] (fax)  [place]

## 2020-03-18 ENCOUNTER — Other Ambulatory Visit: Payer: Self-pay

## 2020-03-18 ENCOUNTER — Encounter: Payer: Self-pay | Admitting: Speech Pathology

## 2020-03-18 ENCOUNTER — Ambulatory Visit: Payer: PPO | Admitting: Speech Pathology

## 2020-03-18 ENCOUNTER — Encounter: Payer: PPO | Admitting: Speech Pathology

## 2020-03-18 DIAGNOSIS — R4701 Aphasia: Secondary | ICD-10-CM | POA: Diagnosis not present

## 2020-03-18 NOTE — Therapy (Signed)
[place] MAIN [facility] SERVICES [address] [place], [place], [address] Phone: [phone]   Fax:  [phone]  Speech Language Pathology Treatment  Patient Details  Name: Johnathan Campbell MRN: [id number] Date of Birth: 11/09/1960 Referring Provider (SLP): Dr. Manuella Ghazi   Encounter Date: 03/18/2020  End of Session - 03/18/20 1529    Visit Number  32    Number of Visits  38    Date for SLP Re-Evaluation  04/25/20    Authorization Type  Medicare    Authorization Time Period  Start 03/13/2020    Authorization - Visit Number  2    Authorization - Number of Visits  10    SLP Start Time  1130    SLP Stop Time   1220    SLP Time Calculation (min)  50 min    Activity Tolerance  Patient tolerated treatment well       Past Medical History:  Diagnosis Date  . Acute intractable headache    unspecified headache type  . Allergy   . Aphasia S/P CVA   . Clot    blood clot  . Completed stroke ([place])   . Hypercholesterolemia     Past Surgical History:  Procedure Laterality Date  . HERNIA REPAIR  twice    There were no vitals filed for this visit.  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 Patient history is significant for aphasia and seizures. Patient received SLP services in 2017 and has returned because he says his speech is "still not where he wants it to be". Patient has been attending teletherapy sessions with the [facility] (TAP) due to COVID-19, however, he is searching for individual therapy because he wants more intensive treatment.       Treatment Provided   Treatment  provided  Cognitive-Linquistic      Pain Assessment   Pain Assessment  No/denies pain      Cognitive-Linquistic Treatment   Treatment focused on  Aphasia    Skilled Treatment  VERBAL REASONING: Read aloud definitions and clues with 70% fluency.  Write word given definition with 70% accuracy given min-mod cues.  Complete deduction puzzles given min cues to understand task, reason through the clues, and accurately read the clue.      Assessment / Recommendations / Plan   Plan  Continue with current plan of care      Progression Toward Goals   Progression toward goals  Progressing toward goals       SLP Education - 03/18/20 1528    Education Details  tolerate the struggle and try to understand WHY something is difficult    Person(s) Educated  Patient    Methods  Explanation    Comprehension  Verbalized understanding         SLP Long Term Goals - 03/12/20 1353      SLP LONG TERM GOAL #1   Title  Patient will generate grammatical and cogent sentence to complete abstract/comple linguistic task with 80% accuracy.    Status  Partially Met    Target Date  04/25/20      SLP LONG TERM GOAL #2   Title  Patient will read aloud sentences and multi-syllabic words, maintaining phonemic accuracy, with 80% accuracy.      Status  Partially Met    Target Date  04/25/20      SLP LONG TERM GOAL #3   Title  Patient will write grammatical and cogent sentence to complete simple/basic linguistic task with 80% accuracy.    Status  Partially Met    Target Date  04/25/20      SLP LONG TERM GOAL #4   Title  Patient will complete multi-unit processing tasks with 80% accuracy without the need of repetition of task instructions or significant delays in responding.    Status  Partially Met    Target Date  04/25/20      SLP LONG TERM GOAL #5   Title  Patient will demonstrate reading comprehension for paragraphs with 80% accuracy.    Status  Partially Met    Target Date  04/25/20       Plan -  03/18/20 1530    Clinical Impression Statement  The patient is making gains in all areas of communication.  He was frustrated with today's task, which are much more abstract/complex, and persevered.       Patient will benefit from skilled therapeutic intervention in order to improve the following deficits and impairments:   Aphasia    Problem List Patient Active Problem List   Diagnosis Date Noted  . Depression 01/16/2016  . Cephalalgia 06/03/2015  . Problems influencing health status 06/03/2015  . 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 Angiopathy 06/03/2015  . Completed stroke ([place]) 06/03/2015  . Hypercholesterolemia 06/03/2015   [name], MS/CCC- SLP  Lou Miner 03/18/2020, 3:30 PM  [place] MAIN [facility] SERVICES [address] [place], [place], [address] Phone: [phone]   Fax:  [phone]   Name: Johnathan Campbell MRN: [id number] Date of Birth: 1961/09/01

## 2020-03-19 ENCOUNTER — Ambulatory Visit (INDEPENDENT_AMBULATORY_CARE_PROVIDER_SITE_OTHER): Payer: PPO | Admitting: Family Medicine

## 2020-03-19 ENCOUNTER — Encounter: Payer: Self-pay | Admitting: Family Medicine

## 2020-03-19 VITALS — BP 146/91 | HR 81 | Temp 96.9°F | Ht 70.0 in | Wt 175.4 lb

## 2020-03-19 DIAGNOSIS — E7800 Pure hypercholesterolemia, unspecified: Secondary | ICD-10-CM | POA: Diagnosis not present

## 2020-03-19 DIAGNOSIS — Z0000 Encounter for general adult medical examination without abnormal findings: Secondary | ICD-10-CM | POA: Diagnosis not present

## 2020-03-19 DIAGNOSIS — I10 Essential (primary) hypertension: Secondary | ICD-10-CM | POA: Diagnosis not present

## 2020-03-19 DIAGNOSIS — I639 Cerebral infarction, unspecified: Secondary | ICD-10-CM | POA: Diagnosis not present

## 2020-03-19 LAB — POCT URINALYSIS DIPSTICK
Bilirubin, UA: NEGATIVE
Blood, UA: NEGATIVE
Glucose, UA: NEGATIVE
Ketones, UA: NEGATIVE
Leukocytes, UA: NEGATIVE
Nitrite, UA: NEGATIVE
Protein, UA: NEGATIVE
Spec Grav, UA: 1.01 (ref 1.010–1.025)
Urobilinogen, UA: 0.2 E.U./dL
pH, UA: 7.5 (ref 5.0–8.0)

## 2020-03-19 MED ORDER — LOSARTAN POTASSIUM 50 MG PO TABS: 50.0000 mg | ORAL_TABLET | Freq: Every day | ORAL | 1 refills | Status: DC

## 2020-03-20 ENCOUNTER — Other Ambulatory Visit: Payer: Self-pay

## 2020-03-20 ENCOUNTER — Ambulatory Visit: Payer: PPO | Admitting: Speech Pathology

## 2020-03-20 ENCOUNTER — Encounter: Payer: Self-pay | Admitting: Speech Pathology

## 2020-03-20 ENCOUNTER — Encounter: Payer: PPO | Admitting: Speech Pathology

## 2020-03-20 DIAGNOSIS — R4701 Aphasia: Secondary | ICD-10-CM | POA: Diagnosis not present

## 2020-03-20 NOTE — Therapy (Signed)
[place] MAIN [facility] SERVICES [address] [place], [place], [address] Phone: [phone]   Fax:  [phone]  Speech Language Pathology Treatment  Patient Details  Name: Johnathan Campbell MRN: [id number] Date of Birth: 1961/02/05 Referring Provider (SLP): Dr. Manuella Ghazi   Encounter Date: 03/20/2020  End of Session - 03/20/20 1629    Visit Number  33    Number of Visits  76    Date for SLP Re-Evaluation  04/25/20    Authorization Type  Medicare    Authorization Time Period  Start 03/13/2020    Authorization - Visit Number  3    Authorization - Number of Visits  10    SLP Start Time  1100    SLP Stop Time   1150    SLP Time Calculation (min)  50 min    Activity Tolerance  Patient tolerated treatment well       Past Medical History:  Diagnosis Date  . Acute intractable headache    unspecified headache type  . Allergy   . Aphasia S/P CVA   . Clot    blood clot  . Completed stroke ([place])   . Hypercholesterolemia     Past Surgical History:  Procedure Laterality Date  . HERNIA REPAIR  twice    There were no vitals filed for this visit.  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 Patient history is significant for aphasia and seizures. Patient received SLP services in 2017 and has returned because he says his speech is "still not where he wants it to be". Patient has been attending teletherapy sessions with the [facility] (TAP) due to COVID-19, however, he is searching for individual therapy because he wants more intensive treatment.       Treatment Provided   Treatment provided  Cognitive-Linquistic       Pain Assessment   Pain Assessment  No/denies pain      Cognitive-Linquistic Treatment   Treatment focused on  Aphasia    Skilled Treatment  VERBAL REASONING: Read aloud definitions, statements, and clues with 70% fluency.  Write word given definition with 70% accuracy given min-mod cues.  Complete deduction puzzles given min-mod cues to understand task, reason through the clues, and accurately read the clue. Read statement and determine if fact or opinion with 70% accuracy.       Assessment / Recommendations / Plan   Plan  Continue with current plan of care      Progression Toward Goals   Progression toward goals  Progressing toward goals       SLP Education - 03/20/20 1629    Education Details  slowing rate improves accuracy    Person(s) Educated  Patient    Methods  Explanation    Comprehension  Verbalized understanding         SLP Long Term Goals - 03/12/20 1353      SLP LONG TERM GOAL #1   Title  Patient will generate grammatical and cogent sentence to complete abstract/comple linguistic task with 80% accuracy.    Status  Partially Met    Target Date  04/25/20      SLP LONG TERM GOAL #2   Title  Patient will read aloud sentences and multi-syllabic words, maintaining phonemic accuracy, with 80% accuracy.      Status  Partially Met    Target Date  04/25/20      SLP LONG TERM GOAL #3   Title  Patient will write grammatical and cogent sentence to complete simple/basic linguistic task with 80% accuracy.    Status  Partially Met    Target Date  04/25/20      SLP LONG TERM GOAL #4   Title  Patient will complete multi-unit processing tasks with 80% accuracy without the need of repetition of task instructions or significant delays in responding.    Status  Partially Met    Target Date  04/25/20      SLP LONG TERM GOAL #5   Title  Patient will demonstrate reading comprehension for paragraphs with 80% accuracy.    Status  Partially Met    Target Date  04/25/20        Plan - 03/20/20 1630    Clinical Impression Statement  The patient is making gains in all areas of communication.  He was frustrated with today's task, which are much more abstract/complex, and persevered.    Speech Therapy Frequency  2x / week    Duration  Other (comment)    Treatment/Interventions  Patient/family education;Language facilitation    Potential to Achieve Goals  Good    Potential Considerations  Ability to learn/carryover information;Family/community support;Previous level of function;Cooperation/participation level    Consulted and Agree with Plan of Care  Patient       Patient will benefit from skilled therapeutic intervention in order to improve the following deficits and impairments:   Aphasia    Problem List Patient Active Problem List   Diagnosis Date Noted  . Depression 01/16/2016  . Cephalalgia 06/03/2015  . Problems influencing health status 06/03/2015  . 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 Angiopathy 06/03/2015  . Completed stroke ([place]) 06/03/2015  . Hypercholesterolemia 06/03/2015   Johnathan Sea, MS/CCC- SLP  Johnathan Campbell 03/20/2020, 4:30 PM  [place] MAIN [facility] SERVICES [address] [place], [place], [address] Phone: [phone]   Fax:  [phone]   Name: Johnathan Campbell MRN: [id number] Date of Birth: Jul 29, 1961

## 2020-03-21 ENCOUNTER — Encounter: Payer: PPO | Admitting: Speech Pathology

## 2020-03-22 DIAGNOSIS — E7800 Pure hypercholesterolemia, unspecified: Secondary | ICD-10-CM | POA: Diagnosis not present

## 2020-03-22 DIAGNOSIS — I10 Essential (primary) hypertension: Secondary | ICD-10-CM | POA: Diagnosis not present

## 2020-03-22 DIAGNOSIS — I639 Cerebral infarction, unspecified: Secondary | ICD-10-CM | POA: Diagnosis not present

## 2020-03-23 LAB — COMPREHENSIVE METABOLIC PANEL
ALT: 40 IU/L (ref 0–44)
AST: 26 IU/L (ref 0–40)
Albumin/Globulin Ratio: 2 (ref 1.2–2.2)
Albumin: 4.3 g/dL (ref 3.8–4.9)
Alkaline Phosphatase: 85 IU/L (ref 48–121)
BUN/Creatinine Ratio: 23 (ref 10–24)
BUN: 23 mg/dL (ref 8–27)
Bilirubin Total: 0.7 mg/dL (ref 0.0–1.2)
CO2: 25 mmol/L (ref 20–29)
Calcium: 9.5 mg/dL (ref 8.6–10.2)
Chloride: 104 mmol/L (ref 96–106)
Creatinine, Ser: 1.02 mg/dL (ref 0.76–1.27)
GFR calc Af Amer: 92 mL/min/{1.73_m2} (ref >59)
GFR calc non Af Amer: 80 mL/min/{1.73_m2} (ref >59)
Globulin, Total: 2.2 g/dL (ref 1.5–4.5)
Glucose: 98 mg/dL (ref 65–99)
Potassium: 4.5 mmol/L (ref 3.5–5.2)
Sodium: 142 mmol/L (ref 134–144)
Total Protein: 6.5 g/dL (ref 6.0–8.5)

## 2020-03-23 LAB — LIPID PANEL
Chol/HDL Ratio: 2.4 ratio (ref 0.0–5.0)
Cholesterol, Total: 143 mg/dL (ref 100–199)
HDL: 59 mg/dL (ref >39)
LDL Chol Calc (NIH): 73 mg/dL (ref 0–99)
Triglycerides: 47 mg/dL (ref 0–149)
VLDL Cholesterol Cal: 11 mg/dL (ref 5–40)

## 2020-03-23 LAB — CBC WITH DIFFERENTIAL/PLATELET
Basophils Absolute: 0.1 10*3/uL (ref 0.0–0.2)
Basos: 1 %
EOS (ABSOLUTE): 0.2 10*3/uL (ref 0.0–0.4)
Eos: 2 %
Hematocrit: 45.5 % (ref 37.5–51.0)
Hemoglobin: 15.2 g/dL (ref 13.0–17.7)
Immature Grans (Abs): 0 10*3/uL (ref 0.0–0.1)
Immature Granulocytes: 0 %
Lymphocytes Absolute: 2.3 10*3/uL (ref 0.7–3.1)
Lymphs: 34 %
MCH: 31.5 pg (ref 26.6–33.0)
MCHC: 33.4 g/dL (ref 31.5–35.7)
MCV: 94 fL (ref 79–97)
Monocytes Absolute: 0.8 10*3/uL (ref 0.1–0.9)
Monocytes: 11 %
Neutrophils Absolute: 3.5 10*3/uL (ref 1.4–7.0)
Neutrophils: 52 %
Platelets: 201 10*3/uL (ref 150–450)
RBC: 4.83 x10E6/uL (ref 4.14–5.80)
RDW: 12.3 % (ref 11.6–15.4)
WBC: 6.8 10*3/uL (ref 3.4–10.8)

## 2020-03-23 LAB — TSH: TSH: 0.647 u[IU]/mL (ref 0.450–4.500)

## 2020-03-23 LAB — PSA: Prostate Specific Ag, Serum: 2.4 ng/mL (ref 0.0–4.0)

## 2020-03-25 ENCOUNTER — Other Ambulatory Visit: Payer: Self-pay

## 2020-03-25 ENCOUNTER — Ambulatory Visit: Payer: PPO | Admitting: Speech Pathology

## 2020-03-25 DIAGNOSIS — R4701 Aphasia: Secondary | ICD-10-CM

## 2020-03-26 ENCOUNTER — Telehealth: Payer: Self-pay

## 2020-03-26 ENCOUNTER — Encounter: Payer: Self-pay | Admitting: Speech Pathology

## 2020-03-26 NOTE — Telephone Encounter (Signed)
-----   Message from Jerrol Banana., MD sent at 03/26/2020  9:37 AM EDT ----- Labs all in normal range

## 2020-03-26 NOTE — Telephone Encounter (Signed)
Patients mother advised  

## 2020-03-26 NOTE — Therapy (Signed)
[place] MAIN [facility] SERVICES [address] [place], [place], [address] Phone: [phone]   Fax:  [phone]  Speech Language Pathology Treatment  Patient Details  Name: Johnathan Campbell MRN: [id number] Date of Birth: 02/06/1961 Referring Provider (SLP): Dr. Manuella Ghazi   Encounter Date: 03/25/2020  End of Session - 03/26/20 0835    Visit Number  34    Number of Visits  20    Date for SLP Re-Evaluation  04/25/20    Authorization Type  Medicare    Authorization Time Period  Start 03/13/2020    Authorization - Visit Number  4    Authorization - Number of Visits  10    SLP Start Time  1100    SLP Stop Time   1150    SLP Time Calculation (min)  50 min    Activity Tolerance  Patient tolerated treatment well       Past Medical History:  Diagnosis Date  . Acute intractable headache    unspecified headache type  . Allergy   . Aphasia S/P CVA   . Clot    blood clot  . Completed stroke ([place])   . Hypercholesterolemia     Past Surgical History:  Procedure Laterality Date  . HERNIA REPAIR  twice    There were no vitals filed for this visit.  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 Patient history is significant for aphasia and seizures. Patient received SLP services in 2017 and has returned because he says his speech is "still not where he wants it to be". Patient has been attending teletherapy sessions with the [facility] (TAP) due to COVID-19, however, he is searching for individual therapy because he wants more intensive treatment.       Treatment Provided   Treatment provided   Cognitive-Linquistic      Pain Assessment   Pain Assessment  No/denies pain      Cognitive-Linquistic Treatment   Treatment focused on  Aphasia    Skilled Treatment  VERBAL REASONING: Patient came up with a solution for multiple meaning words in 85% of trials. Therapist gave mild cues (read phrase aloud, "a baby cow is called a __). Read and answered negative true/false statements at 90% accuracy. Mainly had difficulty when he read and answered very quickly. Completed deduction puzzle with min cueing ("focus on clue 3"). PROSODY: Read aloud multisyllabic words, putting the emphasis on the correct syllable in 70% of opportunities.      Assessment / Recommendations / Plan   Plan  Continue with current plan of care      Progression Toward Goals   Progression toward goals  Progressing toward goals       SLP Education - 03/26/20 0835    Education Details  Slow rate of speech    Person(s) Educated  Patient    Methods  Explanation    Comprehension  Verbalized understanding         SLP Long Term Goals - 03/12/20 1353      SLP LONG TERM GOAL #1   Title  Patient  will generate grammatical and cogent sentence to complete abstract/comple linguistic task with 80% accuracy.    Status  Partially Met    Target Date  04/25/20      SLP LONG TERM GOAL #2   Title  Patient will read aloud sentences and multi-syllabic words, maintaining phonemic accuracy, with 80% accuracy.      Status  Partially Met    Target Date  04/25/20      SLP LONG TERM GOAL #3   Title  Patient will write grammatical and cogent sentence to complete simple/basic linguistic task with 80% accuracy.    Status  Partially Met    Target Date  04/25/20      SLP LONG TERM GOAL #4   Title  Patient will complete multi-unit processing tasks with 80% accuracy without the need of repetition of task instructions or significant delays in responding.    Status  Partially Met    Target Date  04/25/20      SLP LONG TERM GOAL #5    Title  Patient will demonstrate reading comprehension for paragraphs with 80% accuracy.    Status  Partially Met    Target Date  04/25/20       Plan - 03/26/20 0836    Clinical Impression Statement  Patient demonstrated improvements in reading comprehension. He had greater difficulty completing a figural grid and understanding patterns.    Speech Therapy Frequency  2x / week    Duration  Other (comment)    Treatment/Interventions  Patient/family education;Language facilitation    Potential to Achieve Goals  Good    Potential Considerations  Ability to learn/carryover information;Family/community support;Previous level of function;Cooperation/participation level    Consulted and Agree with Plan of Care  Patient       Patient will benefit from skilled therapeutic intervention in order to improve the following deficits and impairments:   Aphasia    Problem List Patient Active Problem List   Diagnosis Date Noted  . Depression 01/16/2016  . Cephalalgia 06/03/2015  . Problems influencing health status 06/03/2015  . 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 Angiopathy 06/03/2015  . Completed stroke ([place]) 06/03/2015  . Hypercholesterolemia 06/03/2015   [name], MS/CCC- SLP  Lou Miner 03/26/2020, 8:36 AM  [place] MAIN [facility] SERVICES [address] [place], [place], [address] Phone: [phone]   Fax:  [phone]   Name: Johnathan Campbell MRN: [id number] Date of Birth: May 04, 1961

## 2020-03-27 ENCOUNTER — Ambulatory Visit: Payer: PPO | Admitting: Speech Pathology

## 2020-03-27 ENCOUNTER — Other Ambulatory Visit: Payer: Self-pay

## 2020-03-27 ENCOUNTER — Encounter: Payer: Self-pay | Admitting: Speech Pathology

## 2020-03-27 DIAGNOSIS — R4701 Aphasia: Secondary | ICD-10-CM | POA: Diagnosis not present

## 2020-03-27 NOTE — Therapy (Signed)
[place] MAIN [facility] SERVICES [address] [place], [place], [address] Phone: [phone]   Fax:  [phone]  Speech Language Pathology Treatment  Patient Details  Name: Johnathan Campbell MRN: [id number] Date of Birth: November 16, 1960 Referring Provider (Campbell): Dr. Manuella Ghazi   Encounter Date: 03/27/2020  End of Session - 03/27/20 1643    Visit Number  35    Number of Visits  60    Date for Campbell Re-Evaluation  04/25/20    Authorization Type  Medicare    Authorization Time Period  Start 03/13/2020    Authorization - Visit Number  5    Authorization - Number of Visits  10    Campbell Start Time  1100    Campbell Stop Time   1150    Campbell Time Calculation (min)  50 min    Activity Tolerance  Patient tolerated treatment well       Past Medical History:  Diagnosis Date  . Acute intractable headache    unspecified headache type  . Allergy   . Aphasia S/P CVA   . Clot    blood clot  . Completed stroke ([place])   . Hypercholesterolemia     Past Surgical History:  Procedure Laterality Date  . HERNIA REPAIR  twice    There were no vitals filed for this visit.  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 Patient history is significant for aphasia and seizures. Patient received Campbell services in 2017 and has returned because he says his speech is "still not where he wants it to be". Patient has been attending teletherapy sessions with the [facility] ([facility]) due to COVID-19, however, he is searching for individual therapy because he wants more intensive treatment.       Treatment Provided   Treatment provided   Cognitive-Linquistic      Pain Assessment   Pain Assessment  No/denies pain      Cognitive-Linquistic Treatment   Treatment focused on  Aphasia    Skilled Treatment  VERBAL REASONING: Read aloud definitions, statements, and clues with 70% fluency.  Write word given 2 descriptors with 90% accuracy given min-mod assistance for spelling/pronunciation.  Complete deduction puzzles given min-mod cues to understand task, reason through the clues, and accurately read the clue. VERBAL EXPRESSION:  Generates cogent, grammatical phrases/sentences to share personal stories with 70% accuracy.  Cogency improves given questions to clarify points.      Assessment / Recommendations / Plan   Plan  Continue with current plan of care      Progression Toward Goals   Progression toward goals  Progressing toward goals       Campbell Education - 03/27/20 1642    Education Details  Slow rate of speech to improve accuracy    Person(s) Educated  Patient    Methods  Explanation    Comprehension  Verbalized understanding         Campbell Long Term Goals - 03/12/20 1353      Campbell LONG TERM GOAL #1   Title  Patient will generate grammatical and cogent sentence  to complete abstract/comple linguistic task with 80% accuracy.    Status  Partially Met    Target Date  04/25/20      Campbell LONG TERM GOAL #2   Title  Patient will read aloud sentences and multi-syllabic words, maintaining phonemic accuracy, with 80% accuracy.      Status  Partially Met    Target Date  04/25/20      Campbell LONG TERM GOAL #3   Title  Patient will write grammatical and cogent sentence to complete simple/basic linguistic task with 80% accuracy.    Status  Partially Met    Target Date  04/25/20      Campbell LONG TERM GOAL #4   Title  Patient will complete multi-unit processing tasks with 80% accuracy without the need of repetition of task instructions or significant delays in responding.    Status  Partially Met    Target Date  04/25/20      Campbell  LONG TERM GOAL #5   Title  Patient will demonstrate reading comprehension for paragraphs with 80% accuracy.    Status  Partially Met    Target Date  04/25/20       Plan - 03/27/20 1643    Clinical Impression Statement  VERBAL REASONING: Read aloud definitions, statements, and clues with 70% fluency.  Write word given 2 descriptors with 90% accuracy given min-mod assistance for spelling/pronunciation.  Complete deduction puzzles given min-mod cues to understand task, reason through the clues, and accurately read the clue. VERBAL EXPRESSION:  Generates cogent, grammatical phrases/sentences to share personal stories with 70% accuracy.  Cogency improves given questions to clarify points.    Speech Therapy Frequency  2x / week    Duration  Other (comment)    Treatment/Interventions  Patient/family education;Language facilitation    Potential to Achieve Goals  Good    Potential Considerations  Ability to learn/carryover information;Family/community support;Previous level of function;Cooperation/participation level    Consulted and Agree with Plan of Care  Patient       Patient will benefit from skilled therapeutic intervention in order to improve the following deficits and impairments:   Aphasia    Problem List Patient Active Problem List   Diagnosis Date Noted  . Depression 01/16/2016  . Cephalalgia 06/03/2015  . Problems influencing health status 06/03/2015  . 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 Angiopathy 06/03/2015  . Completed stroke ([place]) 06/03/2015  . Hypercholesterolemia 06/03/2015   Johnathan Campbell, Johnathan Campbell  Johnathan Campbell 03/27/2020, 4:45 PM  [place] MAIN [facility] SERVICES [address] [place], [place], [address] Phone: [phone]   Fax:  [phone]   Name: Johnathan Campbell MRN: [id number] Date of Birth: 25-Jun-1961

## 2020-04-03 ENCOUNTER — Encounter: Payer: Self-pay | Admitting: Speech Pathology

## 2020-04-03 ENCOUNTER — Ambulatory Visit: Payer: PPO | Attending: Neurology | Admitting: Speech Pathology

## 2020-04-03 ENCOUNTER — Other Ambulatory Visit: Payer: Self-pay

## 2020-04-03 DIAGNOSIS — R4701 Aphasia: Secondary | ICD-10-CM | POA: Diagnosis not present

## 2020-04-03 NOTE — Therapy (Signed)
[place] MAIN [facility] SERVICES [address] [place], [place], [address] Phone: [phone]   Fax:  [phone]  Speech Language Pathology Treatment  Patient Details  Name: Johnathan Campbell MRN: [id number] Date of Birth: [birth year] Referring Provider (SLP): Dr. Manuella Ghazi   Encounter Date: 04/03/2020  End of Session - 04/03/20 1338    Visit Number  36    Number of Visits  49    Date for SLP Re-Evaluation  04/25/20    Authorization Type  Medicare    Authorization Time Period  Start 03/13/2020    Authorization - Visit Number  6    Authorization - Number of Visits  10    SLP Start Time  1100    SLP Stop Time   1150    SLP Time Calculation (min)  50 min    Activity Tolerance  Patient tolerated treatment well       Past Medical History:  Diagnosis Date  . Acute intractable headache    unspecified headache type  . Allergy   . Aphasia S/P CVA   . Clot    blood clot  . Completed stroke ([place])   . Hypercholesterolemia     Past Surgical History:  Procedure Laterality Date  . HERNIA REPAIR  twice    There were no vitals filed for this visit.  Subjective Assessment - 04/03/20 1337    Subjective  Patient was focused and especially determined to pronounce words correctly.            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 Patient history is significant for aphasia and seizures. Patient received SLP services in 2017 and has returned because he says his speech is "still not where he wants it to be". Patient has been attending teletherapy sessions with the [facility] ([facility]) due to COVID-19, however, he is searching for individual therapy because he wants more intensive treatment.       Treatment Provided   Treatment provided  Cognitive-Linquistic      Pain Assessment   Pain Assessment  No/denies pain      Cognitive-Linquistic Treatment   Treatment focused on  Aphasia    Skilled Treatment  EXPRESSION: Completed a categorization grid by coming up with general categories and subcategories. Came up with solutions to 70% of questions independently; improved to 90% with mid cueing ("think about what general category an eagle fits into"). Read descriptive statements and came up with an appropriate word in 95% of trials. PROSODY: Read aloud a list of words using different intonation patterns (1, 2, and 3 syllable words; varying stressed syllables). One syllable words: 95% fluency; two syllable words: 80% fluency; 3 syllable words: 65% fluency.      Assessment / Recommendations / Plan   Plan  Continue with current plan of care      Progression Toward Goals   Progression toward goals  Progressing toward goals       SLP Education - 04/03/20 1337    Education Details  Stressing syllables, slowing speech         SLP Long Term Goals - 03/12/20 1353      SLP LONG TERM GOAL #1   Title  Patient will generate grammatical and cogent sentence to complete  abstract/comple linguistic task with 80% accuracy.    Status  Partially Met    Target Date  04/25/20      SLP LONG TERM GOAL #2   Title  Patient will read aloud sentences and multi-syllabic words, maintaining phonemic accuracy, with 80% accuracy.      Status  Partially Met    Target Date  04/25/20      SLP LONG TERM GOAL #3   Title  Patient will write grammatical and cogent sentence to complete simple/basic linguistic task with 80% accuracy.    Status  Partially Met    Target Date  04/25/20      SLP LONG TERM GOAL #4   Title  Patient will complete multi-unit processing tasks with 80% accuracy without the need of repetition of task instructions or significant delays in responding.    Status  Partially Met    Target Date  04/25/20      SLP LONG  TERM GOAL #5   Title  Patient will demonstrate reading comprehension for paragraphs with 80% accuracy.    Status  Partially Met    Target Date  04/25/20       Plan - 04/03/20 1338    Clinical Impression Statement  Patient is showing improvement in word-finding abilities. He had more difficulty with categorization, but often comes to a correct conclusion when he talks through it out loud. Patient is continuing to read at a fast pace and has more success when he slows down. Has better accuracy in pronouncing words when they are separated by syllables. Showed improvement in intonation.    Speech Therapy Frequency  2x / week    Duration  Other (comment)    Treatment/Interventions  Patient/family education;Language facilitation    Potential to Achieve Goals  Good    Potential Considerations  Ability to learn/carryover information;Family/community support;Previous level of function;Cooperation/participation level       Patient will benefit from skilled therapeutic intervention in order to improve the following deficits and impairments:   Aphasia    Problem List Patient Active Problem List   Diagnosis Date Noted  . Depression 01/16/2016  . Cephalalgia 06/03/2015  . Problems influencing health status 06/03/2015  . 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 Angiopathy 06/03/2015  . Completed stroke ([place]) 06/03/2015  . Hypercholesterolemia 06/03/2015    Maylon Cos, Student Intern 04/03/2020, 1:39 PM  [place] MAIN [facility] SERVICES [address] [place], [place], [address] Phone: [phone]   Fax:  [phone]   Name: ELBA DENDINGER MRN: [id number] Date of Birth: 10/06/1961

## 2020-04-04 ENCOUNTER — Telehealth: Payer: Self-pay | Admitting: Family Medicine

## 2020-04-04 DIAGNOSIS — I639 Cerebral infarction, unspecified: Secondary | ICD-10-CM

## 2020-04-04 DIAGNOSIS — E7800 Pure hypercholesterolemia, unspecified: Secondary | ICD-10-CM

## 2020-04-04 DIAGNOSIS — I10 Essential (primary) hypertension: Secondary | ICD-10-CM

## 2020-04-04 MED ORDER — LOSARTAN POTASSIUM 50 MG PO TABS: 50.0000 mg | ORAL_TABLET | Freq: Every day | ORAL | 1 refills | Status: DC

## 2020-04-04 NOTE — Telephone Encounter (Signed)
Error

## 2020-04-04 NOTE — Telephone Encounter (Signed)
Patient's mother is calling to schedule AWV visit. Patient was last in the office in 5/21. Next OV is scheduled for 7/21. Patient's mother would like to know why does he need AWV. CB- [phone]

## 2020-04-04 NOTE — Telephone Encounter (Signed)
FYI!! [facility] was needing a refill for losartan for pt. Home bp's 128/89, 153/83,123/73.

## 2020-04-08 MED ORDER — LOSARTAN POTASSIUM 50 MG PO TABS: 50.0000 mg | ORAL_TABLET | Freq: Every day | ORAL | 3 refills | Status: DC

## 2020-04-08 NOTE — Telephone Encounter (Signed)
Medication refilled for 1 year and sent to pharmacy.

## 2020-04-08 NOTE — Addendum Note (Signed)
Addended by: Gerald Stabs on: 04/08/2020 02:45 PM   Modules accepted: Orders

## 2020-04-08 NOTE — Telephone Encounter (Signed)
Ok to refill for 1 year 

## 2020-04-10 ENCOUNTER — Other Ambulatory Visit: Payer: Self-pay

## 2020-04-10 ENCOUNTER — Ambulatory Visit: Payer: PPO | Admitting: Speech Pathology

## 2020-04-10 ENCOUNTER — Encounter: Payer: Self-pay | Admitting: Speech Pathology

## 2020-04-10 DIAGNOSIS — R4701 Aphasia: Secondary | ICD-10-CM

## 2020-04-10 NOTE — Therapy (Signed)
[place] MAIN [facility] SERVICES [address] [place], [place], [address] Phone: [phone]   Fax:  [phone]  Speech Language Pathology Treatment  Patient Details  Name: Johnathan Campbell MRN: [id number] Date of Birth: 09-Apr-1961 Referring Provider (SLP): Dr. Manuella Ghazi   Encounter Date: 04/10/2020  End of Session - 04/10/20 1346    Visit Number  37    Number of Visits  78    Date for SLP Re-Evaluation  04/25/20    Authorization Type  Medicare    Authorization Time Period  Start 03/13/2020    Authorization - Visit Number  7    Authorization - Number of Visits  10    SLP Start Time  1100    SLP Stop Time   1155    SLP Time Calculation (min)  55 min    Activity Tolerance  Patient tolerated treatment well       Past Medical History:  Diagnosis Date  . Acute intractable headache    unspecified headache type  . Allergy   . Aphasia S/P CVA   . Clot    blood clot  . Completed stroke ([place])   . Hypercholesterolemia     Past Surgical History:  Procedure Laterality Date  . HERNIA REPAIR  twice    There were no vitals filed for this visit.  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 Patient history is significant for aphasia and seizures. Patient received SLP services in 2017 and has returned because he says his speech is "still not where he wants it to be". Patient has been attending teletherapy sessions with the [facility] ([facility]) due to COVID-19, however, he is searching for individual therapy because he wants more intensive treatment.       Treatment Provided   Treatment provided  Cognitive-Linquistic       Pain Assessment   Pain Assessment  No/denies pain      Cognitive-Linquistic Treatment   Treatment focused on  Aphasia    Skilled Treatment  COMPREHENSION: Read aloud short paragraphs and answered follow-up comprehension questions at 75% accuracy. Required mid to moderate cueing ("Read the question again. What is it asking?") PROSODY: Read aloud list of words with different intonation patterns; two-syllable: 80% fluency, three-syllable: 60% fluency, four-syllable: 40% fluency.      Assessment / Recommendations / Plan   Plan  Continue with current plan of care      Progression Toward Goals   Progression toward goals  Progressing toward goals       SLP Education - 04/10/20 1345    Education Details  Stress on syllables, slow rate of speech         SLP Long Term Goals - 03/12/20 1353      SLP LONG TERM GOAL #1   Title  Patient will generate grammatical and cogent sentence to complete abstract/comple linguistic task with 80% accuracy.    Status  Partially Met    Target Date  04/25/20      SLP LONG TERM GOAL #2   Title  Patient will read aloud sentences and multi-syllabic words, maintaining  phonemic accuracy, with 80% accuracy.      Status  Partially Met    Target Date  04/25/20      SLP LONG TERM GOAL #3   Title  Patient will write grammatical and cogent sentence to complete simple/basic linguistic task with 80% accuracy.    Status  Partially Met    Target Date  04/25/20      SLP LONG TERM GOAL #4   Title  Patient will complete multi-unit processing tasks with 80% accuracy without the need of repetition of task instructions or significant delays in responding.    Status  Partially Met    Target Date  04/25/20      SLP LONG TERM GOAL #5   Title  Patient will demonstrate reading comprehension for paragraphs with 80% accuracy.    Status  Partially Met    Target Date  04/25/20       Plan - 04/10/20 1346    Clinical Impression Statement  Patient is improving prosody at  the word level. Reviewed 2 and 3 syllable words from last week with greater fluency. Worked on reading at the sentence and paragraph level as well. Demonstrated reading comprehension skills, but needed more cueing to fully understand what the questions were asking (e.g. Is this a yes/no question?).    Speech Therapy Frequency  2x / week    Duration  Other (comment)    Treatment/Interventions  Patient/family education;Language facilitation    Potential to Achieve Goals  Good    Potential Considerations  Ability to learn/carryover information;Family/community support;Previous level of function;Cooperation/participation level       Patient will benefit from skilled therapeutic intervention in order to improve the following deficits and impairments:   Aphasia    Problem List Patient Active Problem List   Diagnosis Date Noted  . Depression 01/16/2016  . Cephalalgia 06/03/2015  . Problems influencing health status 06/03/2015  . 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 Angiopathy 06/03/2015  . Completed stroke ([place]) 06/03/2015  . Hypercholesterolemia 06/03/2015    Maylon Cos, Student Intern 04/10/2020, 1:47 PM  [place] MAIN [facility] SERVICES [address] [place], [place], [address] Phone: [phone]   Fax:  [phone]   Name: Johnathan Campbell MRN: [id number] Date of Birth: 05-10-1961

## 2020-04-15 ENCOUNTER — Encounter: Payer: Self-pay | Admitting: Speech Pathology

## 2020-04-15 ENCOUNTER — Ambulatory Visit: Payer: PPO | Admitting: Speech Pathology

## 2020-04-15 ENCOUNTER — Other Ambulatory Visit: Payer: Self-pay

## 2020-04-15 DIAGNOSIS — R4701 Aphasia: Secondary | ICD-10-CM | POA: Diagnosis not present

## 2020-04-15 NOTE — Therapy (Signed)
[place] MAIN [facility] SERVICES [address] [place], [place], [address] Phone: [phone]   Fax:  [phone]  Speech Language Pathology Treatment  Patient Details  Name: Johnathan Campbell MRN: [id number] Date of Birth: 04-Jan-1961 Referring Provider (SLP): Dr. Manuella Ghazi   Encounter Date: 04/15/2020   End of Session - 04/15/20 1335    Visit Number 38    Number of Visits 76    Date for SLP Re-Evaluation 04/25/20    Authorization Type Medicare    Authorization Time Period Start 03/13/2020    Authorization - Visit Number 8    Authorization - Number of Visits 10    SLP Start Time 1100    SLP Stop Time  1145    SLP Time Calculation (min) 45 min    Activity Tolerance Patient tolerated treatment well           Past Medical History:  Diagnosis Date   Acute intractable headache    unspecified headache type   Allergy    Aphasia S/P CVA    Clot    blood clot   Completed stroke ([place])    Hypercholesterolemia     Past Surgical History:  Procedure Laterality Date   HERNIA REPAIR  twice    There were no vitals filed for this visit.   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 Patient history is significant for aphasia and seizures. Patient received SLP services in 2017 and has returned because he says his speech is "still not where he wants it to be". Patient has been attending teletherapy sessions with the [facility] ([facility]) due to COVID-19, however, he is searching for individual therapy because he wants more intensive treatment.       Treatment Provided   Treatment provided  Cognitive-Linquistic      Pain Assessment   Pain Assessment No/denies pain      Cognitive-Linquistic Treatment   Treatment focused on Aphasia    Skilled Treatment VERBAL REASONING: Completed analogies at 85% accuracy with mid cueing ("read that one again"). Solved two deduction puzzles with mid cueing ("look at number 7 again"). COMPREHENSION: Read aloud short paragraphs and answered follow-up questions correctly in 70% of opportunities.      Assessment / Recommendations / Plan   Plan Continue with current plan of care      Progression Toward Goals   Progression toward goals Progressing toward goals            SLP Education - 04/15/20 1335    Education Details Slow down when reading    Person(s) Educated Patient    Methods Explanation    Comprehension Verbalized understanding              SLP Long Term Goals - 03/12/20 1353      SLP LONG TERM GOAL #1   Title Patient will generate grammatical and cogent sentence to complete abstract/comple linguistic task with 80% accuracy.    Status Partially Met    Target Date 04/25/20      SLP LONG TERM GOAL #2   Title Patient will  read aloud sentences and multi-syllabic words, maintaining phonemic accuracy, with 80% accuracy.      Status Partially Met    Target Date 04/25/20      SLP LONG TERM GOAL #3   Title Patient will write grammatical and cogent sentence to complete simple/basic linguistic task with 80% accuracy.    Status Partially Met    Target Date 04/25/20      SLP LONG TERM GOAL #4   Title Patient will complete multi-unit processing tasks with 80% accuracy without the need of repetition of task instructions or significant delays in responding.    Status Partially Met    Target Date 04/25/20      SLP LONG TERM GOAL #5   Title Patient will demonstrate reading comprehension for paragraphs with 80% accuracy.    Status Partially Met    Target Date 04/25/20            Plan - 04/15/20 1336    Clinical Impression  Statement Patient is showing proficiency in deductive reasoning skills, requiring mid cueing from therapist. Continuing to work on reading comprehension. Does not always read accurately, especially when he is reading quickly. Answered some questions inappropriately (what questions with yes/no answers, yes/no questions with details).    Speech Therapy Frequency 2x / week    Duration Other (comment)    Treatment/Interventions Patient/family education;Language facilitation    Potential to Achieve Goals Good    Potential Considerations Ability to learn/carryover information;Family/community support;Previous level of function;Cooperation/participation level           Patient will benefit from skilled therapeutic intervention in order to improve the following deficits and impairments:   Aphasia    Problem List Patient Active Problem List   Diagnosis Date Noted   Depression 01/16/2016   Cephalalgia 06/03/2015   Problems influencing health status 06/03/2015   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 [place], [place], [address] Phone: [phone]   Fax:  [phone]   Name: Johnathan Campbell MRN: [id number] Date of Birth: 10-28-1961

## 2020-04-17 ENCOUNTER — Encounter: Payer: Self-pay | Admitting: Speech Pathology

## 2020-04-17 ENCOUNTER — Ambulatory Visit: Payer: PPO | Admitting: Speech Pathology

## 2020-04-17 ENCOUNTER — Other Ambulatory Visit: Payer: Self-pay

## 2020-04-17 DIAGNOSIS — R4701 Aphasia: Secondary | ICD-10-CM | POA: Diagnosis not present

## 2020-04-17 NOTE — Therapy (Signed)
[place] MAIN [facility] SERVICES [address] [place], [place], [address] Phone: [phone]   Fax:  [phone]  Speech Language Pathology Treatment  Patient Details  Name: Johnathan Campbell MRN: [id number] Date of Birth: 18-Feb-1961 Referring Provider (SLP): Dr. Manuella Ghazi   Encounter Date: 04/17/2020   End of Session - 04/17/20 1311    Visit Number 22    Number of Visits 51    Date for SLP Re-Evaluation 04/25/20    Authorization Type Medicare    Authorization Time Period Start 03/13/2020    Authorization - Visit Number 9    Authorization - Number of Visits 10    SLP Start Time 1100    SLP Stop Time  1145    SLP Time Calculation (min) 45 min    Activity Tolerance Patient tolerated treatment well           Past Medical History:  Diagnosis Date  . Acute intractable headache    unspecified headache type  . Allergy   . Aphasia S/P CVA   . Clot    blood clot  . Completed stroke ([place])   . Hypercholesterolemia     Past Surgical History:  Procedure Laterality Date  . HERNIA REPAIR  twice    There were no vitals filed for this visit.   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 Patient history is significant for aphasia and seizures. Patient received SLP services in 2017 and has returned because he says his speech is "still not where he wants it to be". Patient has been attending teletherapy sessions with the [facility] ([facility]) due to COVID-19, however, he is searching for individual therapy because he wants more intensive treatment.       Treatment Provided   Treatment provided  Cognitive-Linquistic      Pain Assessment   Pain Assessment No/denies pain      Cognitive-Linquistic Treatment   Treatment focused on Aphasia    Skilled Treatment VERBAL REASONING: Completed fact/opinion activity at 80% accuracy. READING: Read aloud two paragraphs at 65% fluency. Read and answered follow-up comprehension questions at 70% accuracy.      Assessment / Recommendations / Plan   Plan Continue with current plan of care      Progression Toward Goals   Progression toward goals Progressing toward goals            SLP Education - 04/17/20 1310    Education Details Decrease rate of reading for accuracy    Person(s) Educated Patient    Methods Explanation    Comprehension Verbalized understanding              SLP Long Term Goals - 03/12/20 1353      SLP LONG TERM GOAL #1   Title Patient will generate grammatical and cogent sentence to complete abstract/comple linguistic task with 80% accuracy.    Status Partially Met    Target Date 04/25/20      SLP LONG TERM GOAL #2   Title Patient will read aloud sentences and multi-syllabic words, maintaining phonemic accuracy, with  80% accuracy.      Status Partially Met    Target Date 04/25/20      SLP LONG TERM GOAL #3   Title Patient will write grammatical and cogent sentence to complete simple/basic linguistic task with 80% accuracy.    Status Partially Met    Target Date 04/25/20      SLP LONG TERM GOAL #4   Title Patient will complete multi-unit processing tasks with 80% accuracy without the need of repetition of task instructions or significant delays in responding.    Status Partially Met    Target Date 04/25/20      SLP LONG TERM GOAL #5   Title Patient will demonstrate reading comprehension for paragraphs with 80% accuracy.    Status Partially Met    Target Date 04/25/20            Plan - 04/17/20 1311    Clinical Impression Statement Patient is continuing to work on intonation and prosody when reading  aloud. He is skipping over some details when he increases his rate of reading. Had a hard time distinguishing main ideas versus details in a paragraph.    Speech Therapy Frequency 2x / week    Duration Other (comment)    Treatment/Interventions Patient/family education;Language facilitation    Potential to Achieve Goals Good    Potential Considerations Ability to learn/carryover information;Family/community support;Previous level of function;Cooperation/participation level           Patient will benefit from skilled therapeutic intervention in order to improve the following deficits and impairments:   Aphasia    Problem List Patient Active Problem List   Diagnosis Date Noted  . Depression 01/16/2016  . Cephalalgia 06/03/2015  . Problems influencing health status 06/03/2015  . 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 Angiopathy 06/03/2015  . Completed stroke ([place]) 06/03/2015  . Hypercholesterolemia 06/03/2015    Maylon Cos, Student Intern 04/17/2020, 1:11 PM  [place] MAIN [facility] SERVICES [address] [place], [place], [address] Phone: [phone]   Fax:  [phone]   Name: DAWIT TANKARD MRN: [id number] Date of Birth: 1960-12-01

## 2020-04-22 ENCOUNTER — Other Ambulatory Visit: Payer: Self-pay

## 2020-04-22 ENCOUNTER — Ambulatory Visit: Payer: PPO | Admitting: Speech Pathology

## 2020-04-22 ENCOUNTER — Encounter: Payer: Self-pay | Admitting: Speech Pathology

## 2020-04-22 DIAGNOSIS — R4701 Aphasia: Secondary | ICD-10-CM | POA: Diagnosis not present

## 2020-04-22 NOTE — Therapy (Addendum)
[place] MAIN [facility] SERVICES [address] [place], [place], [address] Phone: [phone]   Fax:  [phone]  Speech Language Pathology Treatment  Speech Therapy Progress Note   Dates of reporting period  03/13/2020   to   04/22/2020   Patient Details  Name: BAYLON SANTELLI MRN: [id number] Date of Birth: Mar 22, 1961 Referring Provider (SLP): Dr. Manuella Ghazi   Encounter Date: 04/22/2020   End of Session - 04/22/20 1703    Visit Number 40    Number of Visits 69    Date for SLP Re-Evaluation 04/25/20    Authorization Type Medicare    Authorization Time Period Start 03/13/2020    Authorization - Visit Number 10    Authorization - Number of Visits 10    SLP Start Time 1055    SLP Stop Time  1145    SLP Time Calculation (min) 50 min    Activity Tolerance Patient tolerated treatment well           Past Medical History:  Diagnosis Date  . Acute intractable headache    unspecified headache type  . Allergy   . Aphasia S/P CVA   . Clot    blood clot  . Completed stroke ([place])   . Hypercholesterolemia     Past Surgical History:  Procedure Laterality Date  . HERNIA REPAIR  twice    There were no vitals filed for this visit.   Subjective Assessment - 04/22/20 1703    Subjective Focused, willing to participate                   SLP Education - 04/22/20 1703    Education Details Pronunciation    Person(s) Educated Patient    Methods Explanation;Demonstration    Comprehension Verbalized understanding;Returned demonstration              SLP Long Term Goals - 04/23/20 1303      SLP LONG TERM GOAL #1   Title Patient will generate grammatical and cogent sentence to complete abstract/comple linguistic task with 80% accuracy.    Status Partially Met    Target Date 04/25/20      SLP LONG TERM GOAL #2   Title Patient will read aloud sentences and multi-syllabic words, maintaining phonemic accuracy, with 80% accuracy.       Status Partially Met    Target Date 04/25/20      SLP LONG TERM GOAL #3   Title Patient will write grammatical and cogent sentence to complete simple/basic linguistic task with 80% accuracy.    Status Partially Met    Target Date 04/25/20      SLP LONG TERM GOAL #4   Title Patient will complete multi-unit processing tasks with 80% accuracy without the need of repetition of task instructions or significant delays in responding.    Status Partially Met    Target Date 04/25/20      SLP LONG TERM GOAL #5   Title Patient will demonstrate reading comprehension for paragraphs with 80% accuracy.    Status Partially Met    Target Date 04/25/20            Plan - 04/22/20 1704    Clinical Impression Statement Patient is able to form coherent sentences much better when he slows down and focuses on one sentence at a time (i.e. completing a worksheet). Has more difficulty when telling a complex story. Skips around and needs to be redirected in order to make sense. Patient  is demonstrating awareness that he needs to slow down to be understood.    Speech Therapy Frequency 2x / week    Duration Other (comment)    Treatment/Interventions Patient/family education;Language facilitation    Potential to Achieve Goals Good    Potential Considerations Ability to learn/carryover information;Family/community support;Previous level of function;Cooperation/participation level           Patient will benefit from skilled therapeutic intervention in order to improve the following deficits and impairments:   Aphasia    Problem List Patient Active Problem List   Diagnosis Date Noted  . Depression 01/16/2016  . Cephalalgia 06/03/2015  . Problems influencing health status 06/03/2015  . 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 Angiopathy 06/03/2015  . Completed stroke ([place]) 06/03/2015  . Hypercholesterolemia 06/03/2015    Lou Miner, Student Intern 04/23/2020, 1:05  PM  [place] MAIN [facility] SERVICES [address] [place], [place], [address] Phone: [phone]   Fax:  [phone]   Name: SHANNA STRENGTH MRN: [id number] Date of Birth: 1961-04-18

## 2020-04-24 ENCOUNTER — Ambulatory Visit: Payer: PPO | Admitting: Speech Pathology

## 2020-04-24 ENCOUNTER — Encounter: Payer: Self-pay | Admitting: Speech Pathology

## 2020-04-24 ENCOUNTER — Other Ambulatory Visit: Payer: Self-pay

## 2020-04-24 DIAGNOSIS — R4701 Aphasia: Secondary | ICD-10-CM | POA: Diagnosis not present

## 2020-04-24 NOTE — Therapy (Signed)
[place] MAIN [facility] SERVICES [address] [place], [place], [address] Phone: [phone]   Fax:  [phone]  Speech Language Pathology Treatment  Patient Details  Name: Johnathan Campbell MRN: [id number] Date of Birth: Mar 25, 1961 Referring Provider (SLP): Dr. Manuella Ghazi   Encounter Date: 04/24/2020   End of Session - 04/24/20 1335    Visit Number 41    Number of Visits 75    Date for SLP Re-Evaluation 04/25/20    Authorization Type Medicare    Authorization Time Period Start 04/24/2020    Authorization - Visit Number 1    Authorization - Number of Visits 10    SLP Start Time 1100    SLP Stop Time  1150    SLP Time Calculation (min) 50 min    Activity Tolerance Patient tolerated treatment well           Past Medical History:  Diagnosis Date  . Acute intractable headache    unspecified headache type  . Allergy   . Aphasia S/P CVA   . Clot    blood clot  . Completed stroke ([place])   . Hypercholesterolemia     Past Surgical History:  Procedure Laterality Date  . HERNIA REPAIR  twice    There were no vitals filed for this visit.   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 Patient history is significant for aphasia and seizures. Patient received SLP services in 2017 and has returned because he says his speech is "still not where he wants it to be". Patient has been attending teletherapy sessions with the [facility] ([facility]) due to COVID-19, however, he is searching for individual therapy because he wants more intensive treatment.       Treatment Provided   Treatment provided Cognitive-Linquistic       Pain Assessment   Pain Assessment No/denies pain      Cognitive-Linquistic Treatment   Treatment focused on Aphasia    Skilled Treatment COMPREHENSION: Completed two deduction puzzles with minimal cueing ("read clue 4 and tell me what you can figure out"). Read aloud a paragraph at 70% fluency. Answered follow-up comprehension questions related to main ideas and details at 70% accuracy.      Assessment / Recommendations / Plan   Plan Continue with current plan of care      Progression Toward Goals   Progression toward goals Progressing toward goals            SLP Education - 04/24/20 1335    Education Details Difference between main ideas and details    Person(s) Educated Patient    Methods Explanation    Comprehension Verbalized understanding              SLP Long Term Goals - 04/23/20 1303      SLP LONG TERM GOAL #1   Title Patient will generate grammatical and cogent sentence to complete abstract/comple linguistic task with 80% accuracy.    Status Partially Met    Target Date 04/25/20      SLP LONG TERM GOAL #2   Title Patient will read  aloud sentences and multi-syllabic words, maintaining phonemic accuracy, with 80% accuracy.      Status Partially Met    Target Date 04/25/20      SLP LONG TERM GOAL #3   Title Patient will write grammatical and cogent sentence to complete simple/basic linguistic task with 80% accuracy.    Status Partially Met    Target Date 04/25/20      SLP LONG TERM GOAL #4   Title Patient will complete multi-unit processing tasks with 80% accuracy without the need of repetition of task instructions or significant delays in responding.    Status Partially Met    Target Date 04/25/20      SLP LONG TERM GOAL #5   Title Patient will demonstrate reading comprehension for paragraphs with 80% accuracy.    Status Partially Met    Target Date 04/25/20            Plan - 04/24/20 1300    Clinical Impression Statement Patient is showing  proficiency in deductive reasoning through puzzles. The most difficult part of reading comprehension is understanding what the question is asking. Patient often gives answers that are true, but do not necessarily address the question. Will work on strategies for answering "wh" questions.    Speech Therapy Frequency 2x / week    Duration Other (comment)    Treatment/Interventions Patient/family education;Language facilitation    Potential to Achieve Goals Good    Potential Considerations Ability to learn/carryover information;Family/community support;Previous level of function;Cooperation/participation level           Patient will benefit from skilled therapeutic intervention in order to improve the following deficits and impairments:   Aphasia    Problem List Patient Active Problem List   Diagnosis Date Noted  . Depression 01/16/2016  . Cephalalgia 06/03/2015  . Problems influencing health status 06/03/2015  . 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 Angiopathy 06/03/2015  . Completed stroke ([place]) 06/03/2015  . Hypercholesterolemia 06/03/2015    Lou Miner, Student Intern 04/24/2020, 3:16 PM  [place] MAIN [facility] SERVICES [address] [place], [place], [address] Phone: [phone]   Fax:  [phone]   Name: TOBI LEINWEBER MRN: [id number] Date of Birth: [birth year]

## 2020-04-29 ENCOUNTER — Encounter: Payer: Self-pay | Admitting: Speech Pathology

## 2020-04-29 ENCOUNTER — Other Ambulatory Visit: Payer: Self-pay

## 2020-04-29 ENCOUNTER — Ambulatory Visit: Payer: PPO | Admitting: Speech Pathology

## 2020-04-29 DIAGNOSIS — R4701 Aphasia: Secondary | ICD-10-CM | POA: Diagnosis not present

## 2020-04-29 NOTE — Therapy (Signed)
[name] REHAB SERVICES [address] [place], [place], [address] Phone: [phone]   Fax:  [phone]  Speech Language Pathology Treatment/Re-Certification  Patient Details  Name: Johnathan Campbell MRN: [id number] Date of Birth: 11/26/1960 Referring Provider (SLP): Dr. Shah   Encounter Date: 04/29/2020   End of Session - 04/29/20 1556    Visit Number 42    Number of Visits 58    Date for SLP Re-Evaluation 06/24/20    Authorization Type Medicare    Authorization Time Period Start 04/24/2020    Authorization - Visit Number 2    Authorization - Number of Visits 10    SLP Start Time 1055    SLP Stop Time  1140    SLP Time Calculation (min) 45 min    Activity Tolerance Patient tolerated treatment well           Past Medical History:  Diagnosis Date  . Acute intractable headache    unspecified headache type  . Allergy   . Aphasia S/P CVA   . Clot    blood clot  . Completed stroke ([facility])   . Hypercholesterolemia     Past Surgical History:  Procedure Laterality Date  . HERNIA REPAIR  twice    There were no vitals filed for this visit.   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 Patient history is significant for aphasia and seizures. Patient received SLP services in 2017 and has returned because he says his speech is "still not where he wants it to be". Patient has been attending teletherapy sessions with the [facility] ([facility]) due to COVID-19, however, he is searching for individual therapy because he wants more intensive treatment.       Treatment Provided   Treatment provided  Cognitive-Linquistic      Pain Assessment   Pain Assessment No/denies pain      Cognitive-Linquistic Treatment   Treatment focused on Aphasia    Skilled Treatment EXPRESSION: Completed categorization grid with mid cueing at 90% accuracy. Answered general information questions with grammatical and cogent sentences at 70% accuracy. Read aloud 3 syllable words at 80% accuracy; 4 syllable words at 60% accuracy.       Assessment / Recommendations / Plan   Plan Continue with current plan of care      Progression Toward Goals   Progression toward goals Progressing toward goals            SLP Education - 04/29/20 1556    Education Details Stress on syllables    Person(s) Educated Patient    Methods Explanation;Demonstration    Comprehension Verbalized understanding;Returned demonstration              SLP Long Term Goals - 04/29/20 1624      SLP LONG TERM GOAL #1   Title Patient will generate grammatical and cogent sentence to complete abstract/comple linguistic task with 80% accuracy.    Time 8    Period Weeks    Status Partially Met    Target Date 06/24/20      SLP LONG TERM GOAL #  2   Title Patient will read aloud sentences and multi-syllabic words, maintaining phonemic accuracy, with 80% accuracy.      Time 8    Period Weeks    Status Partially Met    Target Date 06/24/20      SLP LONG TERM GOAL #3   Title Patient will write grammatical and cogent sentence to complete simple/basic linguistic task with 80% accuracy.    Time 8    Period Weeks    Status Partially Met    Target Date 06/24/20      SLP LONG TERM GOAL #4   Title Patient will complete multi-unit processing tasks with 80% accuracy without the need of repetition of task instructions or significant delays in responding.    Time 8    Period Weeks    Status Partially Met    Target Date 06/24/20      SLP LONG TERM GOAL #5   Title Patient will demonstrate reading comprehension for paragraphs with 80%  accuracy.    Time 8    Period Weeks    Status Partially Met    Target Date 06/24/20            Plan - 04/29/20 1556    Clinical Impression Statement Patient is showing improvement with words finding skills. He sometimes will get stuck in conversation, but is working on providing enough background information in stories to get his point across. Showing improvement in prosody and pronunciation of 3 syllable words. Will continue working on 4 syllables.    Speech Therapy Frequency 2x / week    Duration Other (comment)    Treatment/Interventions Patient/family education;Language facilitation    Potential to Achieve Goals Good    Potential Considerations Ability to learn/carryover information;Family/community support;Previous level of function;Cooperation/participation level           Patient will benefit from skilled therapeutic intervention in order to improve the following deficits and impairments:   Aphasia - Plan: SLP plan of care cert/re-cert    Problem List Patient Active Problem List   Diagnosis Date Noted  . Depression 01/16/2016  . Cephalalgia 06/03/2015  . Problems influencing health status 06/03/2015  . 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 Angiopathy 06/03/2015  . Completed stroke ([facility]) 06/03/2015  . Hypercholesterolemia 06/03/2015    Abernathy, Susie, Student Intern 04/29/2020, 4:29 PM  [place] [facility] MA[facility] REHAB SERVICES [address] [place], [place], [address] Phone: [phone]   Fax:  [phone]   Name: Johnathan Campbell 

## 2020-05-01 ENCOUNTER — Other Ambulatory Visit: Payer: Self-pay

## 2020-05-01 ENCOUNTER — Encounter: Payer: Self-pay | Admitting: Speech Pathology

## 2020-05-01 ENCOUNTER — Ambulatory Visit: Payer: PPO | Admitting: Speech Pathology

## 2020-05-01 DIAGNOSIS — R4701 Aphasia: Secondary | ICD-10-CM

## 2020-05-01 NOTE — Therapy (Signed)
[place] MAIN [facility] SERVICES [address] [place], [place], [address] Phone: [phone]   Fax:  [phone]  Speech Language Pathology Treatment  Patient Details  Name: Johnathan Campbell MRN: [id number] Date of Birth: 05/07/1961 Referring Provider (SLP): Dr. Manuella Ghazi   Encounter Date: 05/01/2020   End of Session - 05/01/20 1324    Visit Number 43    Number of Visits 87    Date for SLP Re-Evaluation 06/24/20    Authorization Type Medicare    Authorization Time Period Start 04/24/2020    Authorization - Visit Number 3    Authorization - Number of Visits 10    SLP Start Time 1100    SLP Stop Time  1145    SLP Time Calculation (min) 45 min    Activity Tolerance Patient tolerated treatment well           Past Medical History:  Diagnosis Date  . Acute intractable headache    unspecified headache type  . Allergy   . Aphasia S/P CVA   . Clot    blood clot  . Completed stroke ([place])   . Hypercholesterolemia     Past Surgical History:  Procedure Laterality Date  . HERNIA REPAIR  twice    There were no vitals filed for this visit.   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 Patient history is significant for aphasia and seizures. Patient received SLP services in 2017 and has returned because he says his speech is "still not where he wants it to be". Patient has been attending teletherapy sessions with the [facility] ([facility]) due to COVID-19, however, he is searching for individual therapy because he wants more intensive treatment.       Treatment Provided   Treatment provided Cognitive-Linquistic       Pain Assessment   Pain Assessment No/denies pain      Cognitive-Linquistic Treatment   Treatment focused on Aphasia    Skilled Treatment READING: Read aloud short paragraphs while maintaining 65% phonemic accuracy. COMPREHENSION: Answered follow-up reading comprehension questions at 75% accuracy with minimal cueing. Completed multi-unit processing tasks at 100% independence.      Assessment / Recommendations / Plan   Plan Continue with current plan of care      Progression Toward Goals   Progression toward goals Progressing toward goals            SLP Education - 05/01/20 1324    Education Details Understanding questions    Person(s) Educated Patient    Methods Explanation    Comprehension Verbalized understanding              SLP Long Term Goals - 04/29/20 1624      SLP LONG TERM GOAL #1   Title Patient will generate grammatical and cogent sentence to complete abstract/comple linguistic task with 80% accuracy.    Time 8    Period Weeks    Status Partially Met    Target Date 06/24/20      SLP LONG TERM GOAL #2   Title Patient will read aloud sentences and multi-syllabic words,  maintaining phonemic accuracy, with 80% accuracy.      Time 8    Period Weeks    Status Partially Met    Target Date 06/24/20      SLP LONG TERM GOAL #3   Title Patient will write grammatical and cogent sentence to complete simple/basic linguistic task with 80% accuracy.    Time 8    Period Weeks    Status Partially Met    Target Date 06/24/20      SLP LONG TERM GOAL #4   Title Patient will complete multi-unit processing tasks with 80% accuracy without the need of repetition of task instructions or significant delays in responding.    Time 8    Period Weeks    Status Partially Met    Target Date 06/24/20      SLP LONG TERM GOAL #5   Title Patient will demonstrate reading comprehension for paragraphs with 80% accuracy.    Time 8    Period Weeks    Status Partially Met    Target  Date 06/24/20            Plan - 05/01/20 1325    Clinical Impression Statement Patient showed proficiency in following directions involving multiple objects. He has much more difficulty with auditory comprehension compared to verbal comprehension (i.e. needs to read the question to understand what it is asking). 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    Speech Therapy Frequency 2x / week    Duration Other (comment)    Treatment/Interventions Patient/family education;Language facilitation    Potential to Achieve Goals Good    Potential Considerations Ability to learn/carryover information;Family/community support;Previous level of function;Cooperation/participation level           Patient will benefit from skilled therapeutic intervention in order to improve the following deficits and impairments:   Aphasia    Problem List Patient Active Problem List   Diagnosis Date Noted  . Depression 01/16/2016  . Cephalalgia 06/03/2015  . Problems influencing health status 06/03/2015  . 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 Angiopathy 06/03/2015  . Completed stroke ([place]) 06/03/2015  . Hypercholesterolemia 06/03/2015    Maylon Cos, Student Intern 05/01/2020, 1:25 PM  [place] MAIN [facility] SERVICES [address] [place], [place], [address] Phone: [phone]   Fax:  [phone]   Name: HARBOR VANOVER MRN: [id number] Date of Birth: Mar 22, 1961

## 2020-05-08 ENCOUNTER — Other Ambulatory Visit: Payer: Self-pay

## 2020-05-08 ENCOUNTER — Encounter: Payer: Self-pay | Admitting: Speech Pathology

## 2020-05-08 ENCOUNTER — Ambulatory Visit: Payer: PPO | Attending: Neurology | Admitting: Speech Pathology

## 2020-05-08 DIAGNOSIS — R4701 Aphasia: Secondary | ICD-10-CM | POA: Diagnosis not present

## 2020-05-08 NOTE — Therapy (Signed)
[facility] MA[facility] REHAB SERVICES [address] [place], , [address] Phone: [phone]   Fax:  [phone]  Speech Language Pathology Treatment  Patient Details  Name: Johnathan Campbell MRN: [id number] Date of Birth: 06/20/1961 Referring Provider (SLP): Dr. Shah   Encounter Date: 05/08/2020   End of Session - 05/08/20 1318    Visit Number 44    Number of Visits 58    Date for SLP Re-Evaluation 06/24/20    Authorization Type Medicare    Authorization Time Period Start 04/24/2020    Authorization - Visit Number 4    Authorization - Number of Visits 10    SLP Start Time 1055    SLP Stop Time  1140    SLP Time Calculation (min) 45 min    Activity Tolerance Patient tolerated treatment well           Past Medical History:  Diagnosis Date  . Acute intractable headache    unspecified headache type  . Allergy   . Aphasia S/P CVA   . Clot    blood clot  . Completed stroke ([facility])   . Hypercholesterolemia     Past Surgical History:  Procedure Laterality Date  . HERNIA REPAIR  twice    There were no vitals filed for this visit.   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 Patient history is significant for aphasia and seizures. Patient received SLP services in 2017 and has returned because he says his speech is "still not where he wants it to be". Patient has been attending teletherapy sessions with the [facility] ([facility]) due to COVID-19, however, he is searching for individual therapy because he wants more intensive treatment.       Treatment Provided   Treatment provided Cognitive-Linquistic      Pain  Assessment   Pain Assessment No/denies pain      Cognitive-Linquistic Treatment   Treatment focused on Aphasia    Skilled Treatment EXPRESSION: Came up with objects given a list of attributes at 90% accuracy independently. READING: Read aloud paragraphs while maintaining 75% phonemic accuracy. COMPREHENSION: Answered follow-up comprehension questions to reading passages at 80% accuracy with mid to moderate cueing.      Assessment / Recommendations / Plan   Plan Continue with current plan of care      Progression Toward Goals   Progression toward goals Progressing toward goals            SLP Education - 05/08/20 1317    Education Details Answering "or" questions    Person(s) Educated Patient    Methods Explanation    Comprehension Verbalized understanding              SLP Long Term Goals - 04/29/20 1624      SLP LONG TERM GOAL #1   Title Patient will generate grammatical and cogent sentence to complete abstract/comple linguistic task with 80% accuracy.    Time 8    Period Weeks    Status Partially Met    Target Date 06/24/20      SLP LONG TERM GOAL #2     Title Patient will read aloud sentences and multi-syllabic words, maintaining phonemic accuracy, with 80% accuracy.      Time 8    Period Weeks    Status Partially Met    Target Date 06/24/20      SLP LONG TERM GOAL #3   Title Patient will write grammatical and cogent sentence to complete simple/basic linguistic task with 80% accuracy.    Time 8    Period Weeks    Status Partially Met    Target Date 06/24/20      SLP LONG TERM GOAL #4   Title Patient will complete multi-unit processing tasks with 80% accuracy without the need of repetition of task instructions or significant delays in responding.    Time 8    Period Weeks    Status Partially Met    Target Date 06/24/20      SLP LONG TERM GOAL #5   Title Patient will demonstrate reading comprehension for paragraphs with 80% accuracy.    Time 8    Period  Weeks    Status Partially Met    Target Date 06/24/20            Plan - 05/08/20 1318    Clinical Impression Statement Patient is improving fluency in reading. He is getting stuck on words less often and is improving intonation when reading. Patient demonstrates good comprehension skills for what he is reading, but still struggles to answer questions correctly, especially when only given a verbal stimulus. Will continue to work on auditory comprehension skills.    Speech Therapy Frequency 2x / week    Duration Other (comment)    Treatment/Interventions Patient/family education;Language facilitation    Potential to Achieve Goals Good    Potential Considerations Ability to learn/carryover information;Family/community support;Previous level of function;Cooperation/participation level           Patient will benefit from skilled therapeutic intervention in order to improve the following deficits and impairments:   Aphasia    Problem List Patient Active Problem List   Diagnosis Date Noted  . Depression 01/16/2016  . Cephalalgia 06/03/2015  . Problems influencing health status 06/03/2015  . 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 Angiopathy 06/03/2015  . Completed stroke ([facility]) 06/03/2015  . Hypercholesterolemia 06/03/2015    Allie Schmidt, Student Intern 05/08/2020, 1:18 PM  [facility] MAIN REHAB SERVICES [address] [place], [place], [address] Phone: [phone]   Fax:  [phone]   Name: Johnathan Campbell MRN: [id number] Date of Birth: 05/31/1961 

## 2020-05-13 ENCOUNTER — Other Ambulatory Visit: Payer: Self-pay

## 2020-05-13 ENCOUNTER — Ambulatory Visit: Payer: PPO | Admitting: Speech Pathology

## 2020-05-13 ENCOUNTER — Encounter: Payer: Self-pay | Admitting: Speech Pathology

## 2020-05-13 DIAGNOSIS — R4701 Aphasia: Secondary | ICD-10-CM

## 2020-05-13 NOTE — Therapy (Signed)
[place] MAIN [facility] SERVICES [address] [place], [place], [address] Phone: [phone]   Fax:  [phone]  Speech Language Pathology Treatment  Patient Details  Name: Johnathan Campbell MRN: [id number] Date of Birth: 1961/09/26 Referring Provider (SLP): Dr. Manuella Ghazi   Encounter Date: 05/13/2020   End of Session - 05/13/20 1328    Visit Number 45    Number of Visits 61    Date for SLP Re-Evaluation 06/24/20    Authorization Type Medicare    Authorization Time Period Start 04/24/2020    Authorization - Visit Number 5    Authorization - Number of Visits 10    SLP Start Time 2297    SLP Stop Time  1150    SLP Time Calculation (min) 45 min    Activity Tolerance Patient tolerated treatment well           Past Medical History:  Diagnosis Date  . Acute intractable headache    unspecified headache type  . Allergy   . Aphasia S/P CVA   . Clot    blood clot  . Completed stroke ([place])   . Hypercholesterolemia     Past Surgical History:  Procedure Laterality Date  . HERNIA REPAIR  twice    There were no vitals filed for this visit.   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 Patient history is significant for aphasia and seizures. Patient received SLP services in 2017 and has returned because he says his speech is "still not where he wants it to be". Patient has been attending teletherapy sessions with the [facility] ([facility]) due to COVID-19, however, he is searching for individual therapy because he wants more intensive treatment.       Treatment Provided   Treatment provided Cognitive-Linquistic      Pain  Assessment   Pain Assessment No/denies pain      Cognitive-Linquistic Treatment   Treatment focused on Aphasia    Skilled Treatment COMPREHENSION: Chose item that does not belong in a group (f=5) at 90% accuracy and named the group at 75% accuracy. REASONING: Started to complete perplexor puzzle with moderate cueing from clinician. Did not finish and asked to take the rest home for homework.      Assessment / Recommendations / Plan   Plan Continue with current plan of care      Progression Toward Goals   Progression toward goals Progressing toward goals            SLP Education - 05/13/20 1328    Education Details Process of elimination    Person(s) Educated Patient    Methods Explanation    Comprehension Verbalized understanding              SLP Long Term Goals - 04/29/20 1624      SLP LONG TERM GOAL #1   Title Patient will generate grammatical and cogent sentence to complete abstract/comple linguistic task with 80% accuracy.    Time 8    Period Weeks    Status Partially Met    Target Date 06/24/20  SLP LONG TERM GOAL #2   Title Patient will read aloud sentences and multi-syllabic words, maintaining phonemic accuracy, with 80% accuracy.      Time 8    Period Weeks    Status Partially Met    Target Date 06/24/20      SLP LONG TERM GOAL #3   Title Patient will write grammatical and cogent sentence to complete simple/basic linguistic task with 80% accuracy.    Time 8    Period Weeks    Status Partially Met    Target Date 06/24/20      SLP LONG TERM GOAL #4   Title Patient will complete multi-unit processing tasks with 80% accuracy without the need of repetition of task instructions or significant delays in responding.    Time 8    Period Weeks    Status Partially Met    Target Date 06/24/20      SLP LONG TERM GOAL #5   Title Patient will demonstrate reading comprehension for paragraphs with 80% accuracy.    Time 8    Period Weeks    Status Partially  Met    Target Date 06/24/20            Plan - 05/13/20 1328    Clinical Impression Statement Patient is improving accuracy with reading skills. He is able to pronounce more words independently and placing stress on the correct syllable. Patient had more difficulty with the perplexor puzzle. Noted that he needs to slow down and take his time when thinking through the clues. 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    Speech Therapy Frequency 2x / week    Duration Other (comment)    Treatment/Interventions Patient/family education;Language facilitation    Potential to Achieve Goals Good    Potential Considerations Ability to learn/carryover information;Family/community support;Previous level of function;Cooperation/participation level           Patient will benefit from skilled therapeutic intervention in order to improve the following deficits and impairments:   Aphasia    Problem List Patient Active Problem List   Diagnosis Date Noted  . Depression 01/16/2016  . Cephalalgia 06/03/2015  . Problems influencing health status 06/03/2015  . 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 Angiopathy 06/03/2015  . Completed stroke ([place]) 06/03/2015  . Hypercholesterolemia 06/03/2015    Maylon Cos, Student Intern 05/13/2020, 1:29 PM  [place] MAIN [facility] SERVICES [address] [place], [place], [address] Phone: [phone]   Fax:  [phone]   Name: Johnathan Campbell MRN: [id number] Date of Birth: [birth year]

## 2020-05-15 ENCOUNTER — Ambulatory Visit: Payer: PPO | Admitting: Speech Pathology

## 2020-05-15 ENCOUNTER — Other Ambulatory Visit: Payer: Self-pay

## 2020-05-15 ENCOUNTER — Encounter: Payer: Self-pay | Admitting: Speech Pathology

## 2020-05-15 DIAGNOSIS — R4701 Aphasia: Secondary | ICD-10-CM

## 2020-05-15 NOTE — Therapy (Signed)
[place] MAIN [facility] SERVICES [address] [place], [place], [address] Phone: [phone]   Fax:  [phone]  Speech Language Pathology Treatment  Patient Details  Name: Johnathan Campbell MRN: [id number] Date of Birth: Mar 23, 1961 Referring Provider (SLP): Dr. Manuella Ghazi   Encounter Date: 05/15/2020   End of Session - 05/15/20 1307    Visit Number 93    Number of Visits 21    Date for SLP Re-Evaluation 06/24/20    Authorization Type Medicare    Authorization Time Period Start 04/24/2020    Authorization - Visit Number 6    Authorization - Number of Visits 10    SLP Start Time 1100    SLP Stop Time  1150    SLP Time Calculation (min) 50 min    Activity Tolerance Patient tolerated treatment well           Past Medical History:  Diagnosis Date   Acute intractable headache    unspecified headache type   Allergy    Aphasia S/P CVA    Clot    blood clot   Completed stroke ([place])    Hypercholesterolemia     Past Surgical History:  Procedure Laterality Date   HERNIA REPAIR  twice    There were no vitals filed for this visit.   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 Patient history is significant for aphasia and seizures. Patient received SLP services in 2017 and has returned because he says his speech is "still not where he wants it to be". Patient has been attending teletherapy sessions with the [facility] ([facility]) due to COVID-19, however, he is searching for individual therapy because he wants more intensive treatment.       Treatment Provided   Treatment provided Cognitive-Linquistic       Pain Assessment   Pain Assessment No/denies pain      Cognitive-Linquistic Treatment   Treatment focused on Aphasia    Skilled Treatment REASONING: Completed fact/opinion worksheet at 95% accuracy and read aloud phrases at 75% accuracy. Filled in shapes to complete figural grids; 3 independently and 2 with mid to moderate cueing. COMPREHENSION: Followed directions (2 steps with multiple objects) given a verbal stimulus with a moderate level of cueing (repeating direction 3-4 times).      Assessment / Recommendations / Plan   Plan Continue with current plan of care      Progression Toward Goals   Progression toward goals Progressing toward goals            SLP Education - 05/15/20 1307    Education Details Recognizing patterns    Person(s) Educated Patient    Methods Explanation    Comprehension Verbalized understanding              SLP Long Term Goals - 04/29/20 1624      SLP LONG TERM GOAL #1   Title Patient will generate grammatical and cogent sentence to complete abstract/comple linguistic task with 80% accuracy.    Time 8    Period Weeks    Status Partially Met  Target Date 06/24/20      SLP LONG TERM GOAL #2   Title Patient will read aloud sentences and multi-syllabic words, maintaining phonemic accuracy, with 80% accuracy.      Time 8    Period Weeks    Status Partially Met    Target Date 06/24/20      SLP LONG TERM GOAL #3   Title Patient will write grammatical and cogent sentence to complete simple/basic linguistic task with 80% accuracy.    Time 8    Period Weeks    Status Partially Met    Target Date 06/24/20      SLP LONG TERM GOAL #4   Title Patient will complete multi-unit processing tasks with 80% accuracy without the need of repetition of task instructions or significant delays in responding.    Time 8    Period Weeks    Status Partially Met    Target Date 06/24/20      SLP LONG TERM GOAL #5   Title Patient will demonstrate reading  comprehension for paragraphs with 80% accuracy.    Time 8    Period Weeks    Status Partially Met    Target Date 06/24/20            Plan - 05/15/20 1308    Clinical Impression Statement Patient continues to improve reading accuracy. However, he continues to struggle with auditory comprehension, requiring multiple repetitions. Patient showed understanding of visual reasoning exercises once the patterns were explained.    Speech Therapy Frequency 2x / week    Duration Other (comment)    Treatment/Interventions Patient/family education;Language facilitation    Potential to Achieve Goals Good    Potential Considerations Ability to learn/carryover information;Family/community support;Previous level of function;Cooperation/participation level           Patient will benefit from skilled therapeutic intervention in order to improve the following deficits and impairments:   Aphasia    Problem List Patient Active Problem List   Diagnosis Date Noted   Depression 01/16/2016   Cephalalgia 06/03/2015   Problems influencing health status 06/03/2015   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

## 2020-05-17 NOTE — Progress Notes (Signed)
Established patient visit  I,April Miller,acting as a scribe for Wilhemena Durie, MD.,have documented all relevant documentation on the behalf of Wilhemena Durie, MD,as directed by  Wilhemena Durie, MD while in the presence of Wilhemena Durie, MD.   Patient: Johnathan Campbell   DOB: Nov 27, 1960   59 y.o. Male  MRN: [id number] Visit Date: 05/21/2020  Today's healthcare provider: Wilhemena Durie, MD   Chief Complaint  Patient presents with  . Follow-up  . Hypertension   Subjective    HPI  Patient is tolerating losartan well.  Seems to be taking his medications.  Blood pressure is much better. Hypertension, follow-up  BP Readings from Last 3 Encounters:  05/21/20 111/76  03/19/20 (!) 146/91  08/08/19 122/84   Wt Readings from Last 3 Encounters:  05/21/20 178 lb (80.7 kg)  03/19/20 175 lb 6.4 oz (79.6 kg)  08/08/19 175 lb 9.6 oz (79.7 kg)     He was last seen for hypertension 2 months ago.  BP at that visit was 146/91. Management since that visit includes; Added losartan 50 mg daily. He reports good compliance with treatment. He is not having side effects. none He is not exercising. He is adherent to low salt diet.   Outside blood pressures are normal.  He does not smoke.  Use of agents associated with hypertension: none.   --------------------------------------------------------------------  Completed stroke ([place]) From 03/19/2020-Mild expressive aphasia still present but much improved.  Risk factors for future  stroke treated.      Medications: Outpatient Medications Prior to Visit  Medication Sig  . aspirin EC 81 MG tablet Take 81 mg by mouth. Take every other day  . atorvastatin (LIPITOR) 80 MG tablet TAKE 1 TABLET BY MOUTH EVERYDAY AT BEDTIME  . hydrocortisone (PROCTOZONE-HC) 2.5 % rectal cream Place 1 application rectally 2 (two) times daily. (Patient taking differently: Place 1 application rectally 2 (two) times daily. )  . lamoTRIgine  (LAMICTAL) 150 MG tablet Take by mouth.  . losartan (COZAAR) 50 MG tablet Take 1 tablet (50 mg total) by mouth daily.  . montelukast (SINGULAIR) 10 MG tablet TAKE 1 TABLET BY MOUTH EVERYDAY AT BEDTIME  . naproxen (NAPROXEN DR) 500 MG EC tablet Take 1 tablet (500 mg total) by mouth 2 (two) times daily as needed.  . venlafaxine (EFFEXOR) 75 MG tablet TAKE 1 TABLET BY MOUTH TWICE A DAY  . lamoTRIgine (LAMICTAL) 25 MG tablet 75 mg 2 (two) times daily.  (Patient not taking: Reported on 05/21/2020)  . levETIRAcetam (KEPPRA) 500 MG tablet Take 1 tablet (500 mg total) by mouth 2 (two) times daily. (Patient not taking: Reported on 05/21/2020)   No facility-administered medications prior to visit.    Review of Systems  Constitutional: Negative for appetite change, chills and fever.  Respiratory: Negative for chest tightness, shortness of breath and wheezing.   Cardiovascular: Negative for chest pain and palpitations.  Gastrointestinal: Negative for abdominal pain, nausea and vomiting.  Allergic/Immunologic: Negative.   Neurological: Positive for speech difficulty.  Psychiatric/Behavioral: Negative.        Objective    BP 111/76 (BP Location: Right Arm, Patient Position: Sitting, Cuff Size: Large)   Pulse 72   Temp (!) 97.1 F (36.2 C) (Other (Comment))   Resp 16   Ht 5\' 10"  (1.778 m)   Wt 178 lb (80.7 kg)   SpO2 97%   BMI 25.54 kg/m  BP Readings from Last 3 Encounters:  05/21/20 111/76  03/19/20 Marland Kitchen)  146/91  08/08/19 122/84   Wt Readings from Last 3 Encounters:  05/21/20 178 lb (80.7 kg)  03/19/20 175 lb 6.4 oz (79.6 kg)  08/08/19 175 lb 9.6 oz (79.7 kg)      Physical Exam Vitals and nursing note reviewed.  Constitutional:      Appearance: He is well-developed.  HENT:     Head: Normocephalic and atraumatic.  Eyes:     General: No scleral icterus. Cardiovascular:     Rate and Rhythm: Normal rate and regular rhythm.     Heart sounds: Normal heart sounds.  Pulmonary:      Effort: Pulmonary effort is normal.     Breath sounds: Normal breath sounds.  Musculoskeletal:     Cervical back: Normal range of motion and neck supple.  Skin:    General: Skin is warm and dry.  Neurological:     Mental Status: He is alert and oriented to person, place, and time. Mental status is at baseline.  Psychiatric:        Mood and Affect: Mood normal.        Behavior: Behavior normal.        Thought Content: Thought content normal.        Judgment: Judgment normal.     BP 111/76 (BP Location: Right Arm, Patient Position: Sitting, Cuff Size: Large)   Pulse 72   Temp (!) 97.1 F (36.2 C) (Other (Comment))   Resp 16   Ht 5\' 10"  (1.778 m)   Wt 178 lb (80.7 kg)   SpO2 97%   BMI 25.54 kg/m   General Appearance:    Alert, cooperative, no distress, appears stated age  Head:    Normocephalic, without obvious abnormality, atraumatic  Eyes:    PERRL, conjunctiva/corneas clear, EOM's intact, fundi    benign, both eyes       Ears:    Normal TM's and external ear canals, both ears  Nose:   Nares normal, septum midline, mucosa normal, no drainage   or sinus tenderness  Throat:   Lips, mucosa, and tongue normal; teeth and gums normal  Neck:   Supple, symmetrical, trachea midline, no adenopathy;       thyroid:  No enlargement/tenderness/nodules; no carotid   bruit or JVD  Back:     Symmetric, no curvature, ROM normal, no CVA tenderness  Lungs:     Clear to auscultation bilaterally, respirations unlabored  Chest wall:    No tenderness or deformity  Heart:    Regular rate and rhythm, S1 and S2 normal, no murmur, rub   or gallop  Abdomen:     Soft, non-tender, bowel sounds active all four quadrants,    no masses, no organomegaly  Genitalia:    Normal male without lesion, discharge or tenderness  Rectal:    Normal tone, normal prostate, no masses or tenderness;   guaiac negative stool  Extremities:   Extremities normal, atraumatic, no cyanosis or edema  Pulses:   2+ and symmetric  all extremities  Skin:   Skin color, texture, turgor normal, no rashes or lesions  Lymph nodes:   Cervical, supraclavicular, and axillary nodes normal  Neurologic:   CNII-XII intact. Normal strength, sensation and reflexes      throughout     No results found for any visits on 05/21/20.  Assessment & Plan     1. Hypertension, unspecified type Improved on losartan  2. Hypercholesterolemia On statin.  3. 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  4. Depression, unspecified depression type On venlafaxine.    Return in about 6 months (around 11/21/2020).      I, Wilhemena Durie, MD, have reviewed all documentation for this visit. The documentation on 05/21/20 for the exam, diagnosis, procedures, and orders are all accurate and complete.    Josh Nicolosi Cranford Mon, MD  [facility] [phone] (phone) [phone] (fax)  [place]

## 2020-05-20 ENCOUNTER — Ambulatory Visit: Payer: PPO | Admitting: Speech Pathology

## 2020-05-20 ENCOUNTER — Encounter: Payer: Self-pay | Admitting: Speech Pathology

## 2020-05-20 ENCOUNTER — Other Ambulatory Visit: Payer: Self-pay

## 2020-05-20 DIAGNOSIS — R4701 Aphasia: Secondary | ICD-10-CM

## 2020-05-20 NOTE — Therapy (Signed)
[place] MAIN [facility] SERVICES [address] [place], [place], [address] Phone: [phone]   Fax:  [phone]  Speech Language Pathology Treatment  Patient Details  Name: Johnathan Campbell MRN: [id number] Date of Birth: 1961/08/05 Referring Provider (SLP): Dr. Manuella Ghazi   Encounter Date: 05/20/2020   End of Session - 05/20/20 1331    Visit Number 65    Number of Visits 6    Date for SLP Re-Evaluation 06/24/20    Authorization Type Medicare    Authorization Time Period Start 04/24/2020    Authorization - Visit Number 7    Authorization - Number of Visits 10    SLP Start Time 1055    SLP Stop Time  1145    SLP Time Calculation (min) 50 min    Activity Tolerance Patient tolerated treatment well           Past Medical History:  Diagnosis Date  . Acute intractable headache    unspecified headache type  . Allergy   . Aphasia S/P CVA   . Clot    blood clot  . Completed stroke ([place])   . Hypercholesterolemia     Past Surgical History:  Procedure Laterality Date  . HERNIA REPAIR  twice    There were no vitals filed for this visit.   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 Patient history is significant for aphasia and seizures. Patient received SLP services in 2017 and has returned because he says his speech is "still not where he wants it to be". Patient has been attending teletherapy sessions with the [facility] ([facility]) due to COVID-19, however, he is searching for individual therapy because he wants more intensive treatment.       Treatment Provided   Treatment provided Cognitive-Linquistic       Pain Assessment   Pain Assessment No/denies pain      Cognitive-Linquistic Treatment   Treatment focused on Aphasia    Skilled Treatment PROSODY: Read aloud 2, 3, and 4-syllable words with different intonation patterns at 65% accuracy. Able to imitate words after a verbal model from clinician. REASONING: Completed perplexor puzzle with mid cueing ("if all of these are crossed out, what are we left with?").      Assessment / Recommendations / Plan   Plan Continue with current plan of care      Progression Toward Goals   Progression toward goals Progressing toward goals            SLP Education - 05/20/20 1331    Education Details Stressing syllables    Person(s) Educated Patient    Methods Explanation    Comprehension Verbalized understanding              SLP Long Term Goals - 04/29/20 1624      SLP LONG TERM GOAL #1   Title Patient will generate grammatical and cogent sentence to complete abstract/comple linguistic task with 80% accuracy.    Time 8    Period Weeks    Status Partially Met    Target Date 06/24/20  SLP LONG TERM GOAL #2   Title Patient will read aloud sentences and multi-syllabic words, maintaining phonemic accuracy, with 80% accuracy.      Time 8    Period Weeks    Status Partially Met    Target Date 06/24/20      SLP LONG TERM GOAL #3   Title Patient will write grammatical and cogent sentence to complete simple/basic linguistic task with 80% accuracy.    Time 8    Period Weeks    Status Partially Met    Target Date 06/24/20      SLP LONG TERM GOAL #4   Title Patient will complete multi-unit processing tasks with 80% accuracy without the need of repetition of task instructions or significant delays in responding.    Time 8    Period Weeks    Status Partially Met    Target Date 06/24/20      SLP LONG TERM GOAL #5   Title Patient will demonstrate reading comprehension for paragraphs with 80% accuracy.    Time 8    Period Weeks     Status Partially Met    Target Date 06/24/20            Plan - 05/20/20 1332    Clinical Impression Statement Patient showed a big improvement from last week in completing the deductive reasoning puzzle. He used strategies such as crossing out certain options and using different colors. Patient shows determination in pronouncing words. Showing improvement at the word level; will continue to work on prosody at the sentence and paragraph levels.    Speech Therapy Frequency 2x / week    Duration Other (comment)    Treatment/Interventions Patient/family education;Language facilitation    Potential to Achieve Goals Good    Potential Considerations Ability to learn/carryover information;Family/community support;Previous level of function;Cooperation/participation level           Patient will benefit from skilled therapeutic intervention in order to improve the following deficits and impairments:   Aphasia    Problem List Patient Active Problem List   Diagnosis Date Noted  . Depression 01/16/2016  . Cephalalgia 06/03/2015  . Problems influencing health status 06/03/2015  . 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 Angiopathy 06/03/2015  . Completed stroke ([place]) 06/03/2015  . Hypercholesterolemia 06/03/2015    Johnathan Campbell, Student Intern 05/20/2020, 1:32 PM  [place] MAIN [facility] SERVICES [address] [place], [place], [address] Phone: [phone]   Fax:  [phone]   Name: Johnathan Campbell MRN: [id number] Date of Birth: 1960-11-15

## 2020-05-21 ENCOUNTER — Ambulatory Visit (INDEPENDENT_AMBULATORY_CARE_PROVIDER_SITE_OTHER): Payer: PPO | Admitting: Family Medicine

## 2020-05-21 ENCOUNTER — Encounter: Payer: Self-pay | Admitting: Family Medicine

## 2020-05-21 VITALS — BP 111/76 | HR 72 | Temp 97.1°F | Resp 16 | Ht 70.0 in | Wt 178.0 lb

## 2020-05-21 DIAGNOSIS — F32A Depression, unspecified: Secondary | ICD-10-CM

## 2020-05-21 DIAGNOSIS — F329 Major depressive disorder, single episode, unspecified: Secondary | ICD-10-CM

## 2020-05-21 DIAGNOSIS — I69920 Aphasia following unspecified cerebrovascular disease: Secondary | ICD-10-CM | POA: Diagnosis not present

## 2020-05-21 DIAGNOSIS — I10 Essential (primary) hypertension: Secondary | ICD-10-CM | POA: Diagnosis not present

## 2020-05-21 DIAGNOSIS — R569 Unspecified convulsions: Secondary | ICD-10-CM | POA: Diagnosis not present

## 2020-05-21 DIAGNOSIS — E7800 Pure hypercholesterolemia, unspecified: Secondary | ICD-10-CM | POA: Diagnosis not present

## 2020-05-22 ENCOUNTER — Encounter: Payer: Self-pay | Admitting: Speech Pathology

## 2020-05-22 ENCOUNTER — Ambulatory Visit: Payer: PPO | Admitting: Speech Pathology

## 2020-05-22 ENCOUNTER — Other Ambulatory Visit: Payer: Self-pay

## 2020-05-22 DIAGNOSIS — R4701 Aphasia: Secondary | ICD-10-CM

## 2020-05-22 NOTE — Progress Notes (Signed)
Subjective:   Johnathan Campbell is a 59 y.o. male who presents for Medicare Annual/Subsequent preventive examination.  I connected with Jamal Pavon (mother) today by telephone and verified that I am speaking with the correct person using two identifiers. Location patient: home Location provider: work Persons participating in the virtual visit: patient, provider.   I discussed the limitations, risks, security and privacy concerns of performing an evaluation and management service by telephone and the availability of in person appointments. I also discussed with the patient that there may be a patient responsible charge related to this service. The patient expressed understanding and verbally consented to this telephonic visit.    Interactive audio and video telecommunications were attempted between this provider and patient, however failed, due to patient having technical difficulties OR patient did not have access to video capability.  We continued and completed visit with audio only.  Review of Systems    N/A  Cardiac Risk Factors include: advanced age (>76men, >85 women);male gender;dyslipidemia;hypertension     Objective:    There were no vitals filed for this visit. There is no height or weight on file to calculate BMI.  Advanced Directives 05/23/2020 11/28/2019 09/21/2018 09/10/2018 07/15/2018 08/10/2017 07/13/2017  Does Patient Have a Medical Advance Directive? Yes No No No No No No  Type of Advance Directive [place] in Chart? No - copy requested - - - - - -  Would patient like information on creating a medical advance directive? - - No - Patient declined - Yes (MAU/Ambulatory/Procedural Areas - Information given) - Yes (ED - Information included in AVS)    Current Medications (verified) Outpatient Encounter Medications as of 05/23/2020  Medication Sig  . aspirin EC 81 MG tablet Take 81 mg by mouth. Take  every other day  . atorvastatin (LIPITOR) 80 MG tablet TAKE 1 TABLET BY MOUTH EVERYDAY AT BEDTIME  . lamoTRIgine (LAMICTAL) 150 MG tablet Take 150 mg by mouth 2 (two) times daily.   Marland Kitchen losartan (COZAAR) 50 MG tablet Take 1 tablet (50 mg total) by mouth daily.  . naproxen (NAPROXEN DR) 500 MG EC tablet Take 1 tablet (500 mg total) by mouth 2 (two) times daily as needed.  . venlafaxine (EFFEXOR) 75 MG tablet TAKE 1 TABLET BY MOUTH TWICE A DAY  . hydrocortisone (PROCTOZONE-HC) 2.5 % rectal cream Place 1 application rectally 2 (two) times daily. (Patient not taking: Reported on 05/23/2020)  . lamoTRIgine (LAMICTAL) 25 MG tablet 75 mg 2 (two) times daily.  (Patient not taking: Reported on 05/21/2020)  . levETIRAcetam (KEPPRA) 500 MG tablet Take 1 tablet (500 mg total) by mouth 2 (two) times daily. (Patient not taking: Reported on 05/23/2020)  . montelukast (SINGULAIR) 10 MG tablet TAKE 1 TABLET BY MOUTH EVERYDAY AT BEDTIME   No facility-administered encounter medications on file as of 05/23/2020.    Allergies (verified) Patient has no known allergies.   History: Past Medical History:  Diagnosis Date  . Acute intractable headache    unspecified headache type  . Allergy   . Aphasia S/P CVA   . Clot    blood clot  . Completed stroke ([place])   . Hypercholesterolemia    Past Surgical History:  Procedure Laterality Date  . HERNIA REPAIR  twice   Family History  Problem Relation Age of Onset  . Hypertension Mother   . Hyperlipidemia Mother   . Hypertension Father   .  Hyperlipidemia Father    Social History   Socioeconomic History  . Marital status: Divorced    Spouse name: Not on file  . Number of children: 3  . Years of education: Not on file  . Highest education level: Bachelor's degree (e.g., BA, AB, BS)  Occupational History  . 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 Smoking status: Never Smoker  . Smokeless tobacco: Never Used    Vaping Use  . Vaping Use: Never used  Substance and Sexual Activity  . Alcohol use: Yes    Comment: socially- 1/2 drinks at a time  . Drug use: No  . Sexual activity: Never  Other Topics Concern  . Not on file  Social History Narrative  . Not on file   Social Determinants of Health   Financial Resource Strain: Low Risk   . Difficulty of Paying Living Expenses: Not hard at all  Food Insecurity: No Food Insecurity  . Worried About Charity fundraiser in the Last Year: Never true  . Ran Out of Food in the Last Year: Never true  Transportation Needs: No Transportation Needs  . Lack of Transportation (Medical): No  . Lack of Transportation (Non-Medical): No  Physical Activity: Inactive  . Days of Exercise per Week: 0 days  . Minutes of Exercise per Session: 0 min  Stress: No Stress Concern Present  . Feeling of Stress : Not at all  Social Connections: Moderately Integrated  . Frequency of Communication with Friends and Family: Three times a week  . Frequency of Social Gatherings with Friends and Family: More than three times a week  . Attends Religious Services: More than 4 times per year  . Active Member of Clubs or Organizations: Yes  . Attends Archivist Meetings: Never  . Marital Status: Divorced    Tobacco Counseling Counseling given: Not Answered   Clinical Intake:  Pre-visit preparation completed: Yes  Pain : No/denies pain     Nutritional Risks: None Diabetes: No  How often do you need to have someone help you when you read instructions, pamphlets, or other written materials from your doctor or pharmacy?: 1 - Never  Diabetic? No  Interpreter Needed?: No  Information entered by :: [facility], LPN   Activities of Daily Living In your present state of health, do you have any difficulty performing the following activities: 05/23/2020 03/19/2020  Hearing? N N  Vision? Y N  Comment Has vision loss in the left eye from the stroke. -  Difficulty  concentrating or making decisions? N N  Walking or climbing stairs? N N  Dressing or bathing? N N  Doing errands, shopping? N N  Preparing Food and eating ? N -  Using the Toilet? N -  In the past six months, have you accidently leaked urine? N -  Do you have problems with loss of bowel control? N -  Managing your Medications? Y -  Comment Mother manages medications. -  Managing your Finances? N -  Housekeeping or managing your Housekeeping? N -  Some recent data might be hidden    Patient Care Team: Jerrol Banana., MD as PCP - General (Family Medicine) Vladimir Crofts, MD as Consulting Physician (Neurology) Estill Cotta, MD as Consulting Physician (Ophthalmology) Corey Skains, MD as Consulting Physician (Cardiology) Dasher, Rayvon Char, MD (Dermatology)  Indicate any recent Medical Services you may have received from other than [facility] providers  in the past year (date may be approximate).     Assessment:   This is a routine wellness examination for [employer].  Hearing/Vision screen No exam data present  Dietary issues and exercise activities discussed: Current Exercise Habits: The patient does not participate in regular exercise at present, Exercise limited by: neurologic condition(s)  Goals    . Exercise 3x per week (30 min per time)     Recommend to exercise for 3 days a week for at least 30 minutes at a time.       Depression Screen PHQ 2/9 Scores 05/23/2020 03/19/2020 02/02/2019 07/15/2018 12/07/2017 08/10/2017 07/13/2017  PHQ - 2 Score 0 0 0 1 2 2  0  PHQ- 9 Score - 2 0 - 6 6 -    Fall Risk Fall Risk  05/23/2020 03/19/2020 07/15/2018 08/10/2017 07/13/2017  Falls in the past year? 0 0 No No No  Number falls in past yr: 0 0 - - -  Injury with Fall? 0 0 - - -  Follow up - Falls evaluation completed - - -    Any stairs in or around the home? Yes  If so, are there any without handrails? No  Home free of loose throw rugs in walkways, pet beds, electrical cords, etc? Yes    Adequate lighting in your home to reduce risk of falls? Yes   ASSISTIVE DEVICES UTILIZED TO PREVENT FALLS:  Life alert? No  Use of a cane, walker or w/c? No  Grab bars in the bathroom? Yes  Shower chair or bench in shower? No  Elevated toilet seat or a handicapped toilet? Yes    Cognitive Function: Declined today.      6CIT Screen 07/15/2018 07/13/2017  What Year? 0 points 0 points  What month? 0 points 3 points  What time? 0 points 0 points  Count back from 20 0 points 0 points  Months in reverse 4 points 4 points  Repeat phrase 2 points 2 points  Total Score 6 9    Immunizations Immunization History  Administered Date(s) Administered  . Influenza,inj,Quad PF,6+ Mos 07/15/2015, 07/23/2016, 07/13/2017, 07/15/2018, 07/21/2019  . Td 03/31/2005  . Tdap 08/22/2017, 09/10/2018    TDAP status: Up to date Flu Vaccine status: Up to date Covid-19 vaccine status: Completed vaccines  Qualifies for Shingles Vaccine? Yes   Zostavax completed No   Shingrix Completed?: No.    Education has been provided regarding the importance of this vaccine. Patient has been advised to call insurance company to determine out of pocket expense if they have not yet received this vaccine. Advised may also receive vaccine at local pharmacy or Health Dept. Verbalized acceptance and understanding.  Screening Tests Health Maintenance  Topic Date Due  . COLONOSCOPY  02/14/2019  . INFLUENZA VACCINE  06/02/2020  . TETANUS/TDAP  09/10/2028  . Hepatitis C Screening  Completed  . HIV Screening  Completed    Health Maintenance  Health Maintenance Due  Topic Date Due  . COLONOSCOPY  02/14/2019    Colorectal cancer screening: Referral to GI placed today. Pt aware the office will call re: appt.  Lung Cancer Screening: (Low Dose CT Chest recommended if Age 89-80 years, 30 pack-year currently smoking OR have quit w/in 15years.) does not qualify.   Additional Screening:  Hepatitis C Screening: Up to  date  Vision Screening: Recommended annual ophthalmology exams for early detection of glaucoma and other disorders of the eye. Is the patient up to date with their annual eye exam?  Yes  Who is the provider or what is the name of the office in which the patient attends annual eye exams? Dr Dingeldein @ [place] If pt is not established with a provider, would they like to be referred to a provider to establish care? No .   Dental Screening: Recommended annual dental exams for proper oral hygiene  Community Resource Referral / Chronic Care Management: CRR required this visit?  No   CCM required this visit?  No      Plan:     I have personally reviewed and noted the following in the patient's chart:   . Medical and social history . Use of alcohol, tobacco or illicit drugs  . Current medications and supplements . Functional ability and status . Nutritional status . Physical activity . Advanced directives . List of other physicians . Hospitalizations, surgeries, and ER visits in previous 12 months . Vitals . Screenings to include cognitive, depression, and falls . Referrals and appointments  In addition, I have reviewed and discussed with patient certain preventive protocols, quality metrics, and best practice recommendations. A written personalized care plan for preventive services as well as general preventive health recommendations were provided to patient.     Thelonious Kauffmann [place], [place]   8/87/1960   Nurse Notes: None.

## 2020-05-22 NOTE — Therapy (Signed)
[place] MAIN [facility] SERVICES [address] [place], [place], [address] Phone: [phone]   Fax:  [phone]  Speech Language Pathology Treatment  Patient Details  Name: Johnathan Campbell MRN: [id number] Date of Birth: 07/24/1961 Referring Provider (SLP): Dr. Manuella Ghazi   Encounter Date: 05/22/2020   End of Session - 05/22/20 1233    Visit Number 52    Number of Visits 46    Date for SLP Re-Evaluation 06/24/20    Authorization Type Medicare    Authorization Time Period Start 04/24/2020    Authorization - Visit Number 8    Authorization - Number of Visits 10    SLP Start Time 1100    SLP Stop Time  1145    SLP Time Calculation (min) 45 min    Activity Tolerance Patient tolerated treatment well           Past Medical History:  Diagnosis Date  . Acute intractable headache    unspecified headache type  . Allergy   . Aphasia S/P CVA   . Clot    blood clot  . Completed stroke ([place])   . Hypercholesterolemia     Past Surgical History:  Procedure Laterality Date  . HERNIA REPAIR  twice    There were no vitals filed for this visit.   Subjective Assessment - 05/22/20 1232    Subjective "I just have to slow down sometimes"                 ADULT SLP TREATMENT - 05/22/20 0001      General Information   Behavior/Cognition Alert;Cooperative;Pleasant mood    HPI Patient is a 59 year old male who suffered an ischemic left MCA infarct with involvement of both temporal and parietal lobes on 05/15/2013. Patient history is significant for aphasia and seizures. Patient received SLP services in 2017 and has returned because he says his speech is "still not where he wants it to be". Patient has been attending teletherapy sessions with the [facility] ([facility]) due to COVID-19, however, he is searching for individual therapy because he wants more intensive treatment.       Treatment Provided   Treatment provided Cognitive-Linquistic       Pain Assessment   Pain Assessment No/denies pain      Cognitive-Linquistic Treatment   Treatment focused on Aphasia    Skilled Treatment EXPRESSION: Named category given set of three items at 90% accuracy. COMPREHENSION: Read short paragraphs and answered comprehension questions at 80% accuracy. Completed abstract verbal analogies at 80% accuracy.      Assessment / Recommendations / Plan   Plan Continue with current plan of care      Progression Toward Goals   Progression toward goals Progressing toward goals            SLP Education - 05/22/20 1233    Education Details Analogies    Person(s) Educated Patient    Methods Explanation    Comprehension Verbalized understanding              SLP Long Term Goals - 04/29/20 1624      SLP LONG TERM GOAL #1   Title Patient will generate grammatical and cogent sentence to complete abstract/comple linguistic task with 80% accuracy.    Time 8    Period Weeks    Status Partially Met    Target Date 06/24/20      SLP LONG TERM GOAL #2   Title Patient will read aloud sentences  and multi-syllabic words, maintaining phonemic accuracy, with 80% accuracy.      Time 8    Period Weeks    Status Partially Met    Target Date 06/24/20      SLP LONG TERM GOAL #3   Title Patient will write grammatical and cogent sentence to complete simple/basic linguistic task with 80% accuracy.    Time 8    Period Weeks    Status Partially Met    Target Date 06/24/20      SLP LONG TERM GOAL #4   Title Patient will complete multi-unit processing tasks with 80% accuracy without the need of repetition of task instructions or significant delays in responding.    Time 8    Period Weeks    Status Partially Met    Target Date 06/24/20      SLP LONG TERM GOAL #5   Title Patient will demonstrate reading comprehension for paragraphs with 80% accuracy.    Time 8    Period Weeks    Status Partially Met    Target Date 06/24/20            Plan -  05/22/20 1233    Clinical Impression Statement Patient is showing improvement in word finding abilities. He has also shown improvement in answering reading comprehension questions. He sometimes answers incorrectly when he rushes and doesn't focus on what the question is asking. Will continue to work on reading accuracy.    Speech Therapy Frequency 2x / week    Duration Other (comment)    Treatment/Interventions Patient/family education;Language facilitation    Potential to Achieve Goals Good    Potential Considerations Ability to learn/carryover information;Family/community support;Previous level of function;Cooperation/participation level           Patient will benefit from skilled therapeutic intervention in order to improve the following deficits and impairments:   Aphasia    Problem List Patient Active Problem List   Diagnosis Date Noted  . Seizures ([place]) 05/21/2020  . Depression 01/16/2016  . Cephalalgia 06/03/2015  . Problems influencing health status 06/03/2015  . 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 Angiopathy 06/03/2015  . Completed stroke ([place]) 06/03/2015  . Hypercholesterolemia 06/03/2015    Maylon Cos, Student Intern 05/22/2020, 12:34 PM  [place] MAIN [facility] SERVICES [address] [place], [place], [address] Phone: [phone]   Fax:  [phone]   Name: Johnathan Campbell MRN: [id number] Date of Birth: [birth year]

## 2020-05-23 ENCOUNTER — Ambulatory Visit (INDEPENDENT_AMBULATORY_CARE_PROVIDER_SITE_OTHER): Payer: PPO

## 2020-05-23 DIAGNOSIS — Z1211 Encounter for screening for malignant neoplasm of colon: Secondary | ICD-10-CM | POA: Diagnosis not present

## 2020-05-23 DIAGNOSIS — Z0000 Encounter for general adult medical examination without abnormal findings: Secondary | ICD-10-CM

## 2020-05-23 NOTE — Patient Instructions (Signed)
[name] , Thank you for taking time to come for your Medicare Wellness Visit. I appreciate your ongoing commitment to your health goals. Please review the following plan we discussed and let me know if I can assist you in the future.   Screening recommendations/referrals: Colonoscopy: Colorectal cancer screening: Referral to GI placed today. Pt aware the office will call re: appt. Recommended yearly ophthalmology/optometry visit for glaucoma screening and checkup Recommended yearly dental visit for hygiene and checkup  Vaccinations: Influenza vaccine: Done 07/21/19 Tdap vaccine: Up to date, due 09/2028 Shingles vaccine: Shingrix discussed. Please contact your pharmacy for coverage information.     Advanced directives: Please bring a copy of your POA (Power of Attorney) and/or Living Will to your next appointment.   Conditions/risks identified: Recommend to exercise 3 days a week for at least 30 minutes at a time.  Next appointment: 11/21/20 @ 8:00 AM with Dr Rosanna Randy. Declined scheduling an AWV for 2022 at this time.  Preventive Care 29 Years and Older, Male Preventive care refers to lifestyle choices and visits with your health care provider that can promote health and wellness. What does preventive care include?  A yearly physical exam. This is also called an annual well check.  Dental exams once or twice a year.  Routine eye exams. Ask your health care provider how often you should have your eyes checked.  Personal lifestyle choices, including:  Daily care of your teeth and gums.  Regular physical activity.  Eating a healthy diet.  Avoiding tobacco and drug use.  Limiting alcohol use.  Practicing safe sex.  Taking low doses of aspirin every day.  Taking vitamin and mineral supplements as recommended by your health care provider. What happens during an annual well check? The services and screenings done by your health care provider during your annual well check will  depend on your age, overall health, lifestyle risk factors, and family history of disease. Counseling  Your health care provider may ask you questions about your:  Alcohol use.  Tobacco use.  Drug use.  Emotional well-being.  Home and relationship well-being.  Sexual activity.  Eating habits.  History of falls.  Memory and ability to understand (cognition).  Work and work Statistician. Screening  You may have the following tests or measurements:  Height, weight, and BMI.  Blood pressure.  Lipid and cholesterol levels. These may be checked every 5 years, or more frequently if you are over 52 years old.  Skin check.  Lung cancer screening. You may have this screening every year starting at age 63 if you have a 30-pack-year history of smoking and currently smoke or have quit within the past 15 years.  Fecal occult blood test (FOBT) of the stool. You may have this test every year starting at age 48.  Flexible sigmoidoscopy or colonoscopy. You may have a sigmoidoscopy every 5 years or a colonoscopy every 10 years starting at age 1.  Prostate cancer screening. Recommendations will vary depending on your family history and other risks.  Hepatitis C blood test.  Hepatitis B blood test.  Sexually transmitted disease (STD) testing.  Diabetes screening. This is done by checking your blood sugar (glucose) after you have not eaten for a while (fasting). You may have this done every 1-3 years.  Abdominal aortic aneurysm (AAA) screening. You may need this if you are a current or former smoker.  Osteoporosis. You may be screened starting at age 70 if you are at high risk. Talk with your health  care provider about your test results, treatment options, and if necessary, the need for more tests. Vaccines  Your health care provider may recommend certain vaccines, such as:  Influenza vaccine. This is recommended every year.  Tetanus, diphtheria, and acellular pertussis (Tdap,  Td) vaccine. You may need a Td booster every 10 years.  Zoster vaccine. You may need this after age 65.  Pneumococcal 13-valent conjugate (PCV13) vaccine. One dose is recommended after age 63.  Pneumococcal polysaccharide (PPSV23) vaccine. One dose is recommended after age 46. Talk to your health care provider about which screenings and vaccines you need and how often you need them. This information is not intended to replace advice given to you by your health care provider. Make sure you discuss any questions you have with your health care provider. Document Released: 11/15/2015 Document Revised: 07/08/2016 Document Reviewed: 08/20/2015 Elsevier Interactive Patient Education  2017 [place] Prevention in the Home Falls can cause injuries. They can happen to people of all ages. There are many things you can do to make your home safe and to help prevent falls. What can I do on the outside of my home?  Regularly fix the edges of walkways and driveways and fix any cracks.  Remove anything that might make you trip as you walk through a door, such as a raised step or threshold.  Trim any bushes or trees on the path to your home.  Use bright outdoor lighting.  Clear any walking paths of anything that might make someone trip, such as rocks or tools.  Regularly check to see if handrails are loose or broken. Make sure that both sides of any steps have handrails.  Any raised decks and porches should have guardrails on the edges.  Have any leaves, snow, or ice cleared regularly.  Use sand or salt on walking paths during winter.  Clean up any spills in your garage right away. This includes oil or grease spills. What can I do in the bathroom?  Use night lights.  Install grab bars by the toilet and in the tub and shower. Do not use towel bars as grab bars.  Use non-skid mats or decals in the tub or shower.  If you need to sit down in the shower, use a plastic, non-slip  stool.  Keep the floor dry. Clean up any water that spills on the floor as soon as it happens.  Remove soap buildup in the tub or shower regularly.  Attach bath mats securely with double-sided non-slip rug tape.  Do not have throw rugs and other things on the floor that can make you trip. What can I do in the bedroom?  Use night lights.  Make sure that you have a light by your bed that is easy to reach.  Do not use any sheets or blankets that are too big for your bed. They should not hang down onto the floor.  Have a firm chair that has side arms. You can use this for support while you get dressed.  Do not have throw rugs and other things on the floor that can make you trip. What can I do in the kitchen?  Clean up any spills right away.  Avoid walking on wet floors.  Keep items that you use a lot in easy-to-reach places.  If you need to reach something above you, use a strong step stool that has a grab bar.  Keep electrical cords out of the way.  Do not use floor  polish or wax that makes floors slippery. If you must use wax, use non-skid floor wax.  Do not have throw rugs and other things on the floor that can make you trip. What can I do with my stairs?  Do not leave any items on the stairs.  Make sure that there are handrails on both sides of the stairs and use them. Fix handrails that are broken or loose. Make sure that handrails are as long as the stairways.  Check any carpeting to make sure that it is firmly attached to the stairs. Fix any carpet that is loose or worn.  Avoid having throw rugs at the top or bottom of the stairs. If you do have throw rugs, attach them to the floor with carpet tape.  Make sure that you have a light switch at the top of the stairs and the bottom of the stairs. If you do not have them, ask someone to add them for you. What else can I do to help prevent falls?  Wear shoes that:  Do not have high heels.  Have rubber bottoms.  Are  comfortable and fit you well.  Are closed at the toe. Do not wear sandals.  If you use a stepladder:  Make sure that it is fully opened. Do not climb a closed stepladder.  Make sure that both sides of the stepladder are locked into place.  Ask someone to hold it for you, if possible.  Clearly [place] and make sure that you can see:  Any grab bars or handrails.  First and last steps.  Where the edge of each step is.  Use tools that help you move around (mobility aids) if they are needed. These include:  Canes.  Walkers.  Scooters.  Crutches.  Turn on the lights when you go into a dark area. Replace any light bulbs as soon as they burn out.  Set up your furniture so you have a clear path. Avoid moving your furniture around.  If any of your floors are uneven, fix them.  If there are any pets around you, be aware of where they are.  Review your medicines with your doctor. Some medicines can make you feel dizzy. This can increase your chance of falling. Ask your doctor what other things that you can do to help prevent falls. This information is not intended to replace advice given to you by your health care provider. Make sure you discuss any questions you have with your health care provider. Document Released: 08/15/2009 Document Revised: 03/26/2016 Document Reviewed: 11/23/2014 Elsevier Interactive Patient Education  2017 [employer].

## 2020-05-27 ENCOUNTER — Ambulatory Visit: Payer: PPO | Admitting: Speech Pathology

## 2020-05-27 ENCOUNTER — Other Ambulatory Visit: Payer: Self-pay

## 2020-05-27 ENCOUNTER — Encounter: Payer: Self-pay | Admitting: Speech Pathology

## 2020-05-27 DIAGNOSIS — R4701 Aphasia: Secondary | ICD-10-CM | POA: Diagnosis not present

## 2020-05-27 NOTE — Therapy (Signed)
[place] MAIN [facility] SERVICES [address] [place], [place], [address] Phone: [phone]   Fax:  [phone]  Speech Language Pathology Treatment  Patient Details  Name: Johnathan Campbell MRN: [id number] Date of Birth: 09/25/1961 Referring Provider (SLP): Dr. Manuella Ghazi   Encounter Date: 05/27/2020   End of Session - 05/27/20 1233    Visit Number 33    Number of Visits 54    Date for SLP Re-Evaluation 06/24/20    Authorization Type Medicare    Authorization Time Period Start 04/24/2020    Authorization - Visit Number 9    Authorization - Number of Visits 10    SLP Start Time 1100    SLP Stop Time  1145    SLP Time Calculation (min) 45 min    Activity Tolerance Patient tolerated treatment well           Past Medical History:  Diagnosis Date  . Acute intractable headache    unspecified headache type  . Allergy   . Aphasia S/P CVA   . Clot    blood clot  . Completed stroke ([place])   . Hypercholesterolemia     Past Surgical History:  Procedure Laterality Date  . HERNIA REPAIR  twice    There were no vitals filed for this visit.   Subjective Assessment - 05/27/20 1233    Subjective Focused, determined                 ADULT SLP TREATMENT - 05/27/20 0001      General Information   Behavior/Cognition Alert;Cooperative;Pleasant mood    HPI Patient is a 59 year old male who suffered an ischemic left MCA infarct with involvement of both temporal and parietal lobes on 05/15/2013. Patient history is significant for aphasia and seizures. Patient received SLP services in 2017 and has returned because he says his speech is "still not where he wants it to be". Patient has been attending teletherapy sessions with the [facility] ([facility]) due to COVID-19, however, he is searching for individual therapy because he wants more intensive treatment.       Treatment Provided   Treatment provided Cognitive-Linquistic      Pain  Assessment   Pain Assessment No/denies pain      Cognitive-Linquistic Treatment   Treatment focused on Aphasia    Skilled Treatment COMPREHENSION: Completed abstract analogies at 95% accuracy independently. REASONING: Completed perplexor puzzle with mild to moderate cueing from clinician ("look at clue number 3").      Assessment / Recommendations / Plan   Plan Continue with current plan of care      Progression Toward Goals   Progression toward goals Progressing toward goals            SLP Education - 05/27/20 1233    Education Details Process of elimination    Person(s) Educated Patient    Methods Explanation    Comprehension Verbalized understanding              SLP Long Term Goals - 04/29/20 1624      SLP LONG TERM GOAL #1   Title Patient will generate grammatical and cogent sentence to complete abstract/comple linguistic task with 80% accuracy.    Time 8    Period Weeks    Status Partially Met    Target Date 06/24/20      SLP LONG TERM GOAL #2   Title Patient will read aloud sentences and multi-syllabic words, maintaining phonemic accuracy, with 80%  accuracy.      Time 8    Period Weeks    Status Partially Met    Target Date 06/24/20      SLP LONG TERM GOAL #3   Title Patient will write grammatical and cogent sentence to complete simple/basic linguistic task with 80% accuracy.    Time 8    Period Weeks    Status Partially Met    Target Date 06/24/20      SLP LONG TERM GOAL #4   Title Patient will complete multi-unit processing tasks with 80% accuracy without the need of repetition of task instructions or significant delays in responding.    Time 8    Period Weeks    Status Partially Met    Target Date 06/24/20      SLP LONG TERM GOAL #5   Title Patient will demonstrate reading comprehension for paragraphs with 80% accuracy.    Time 8    Period Weeks    Status Partially Met    Target Date 06/24/20            Plan - 05/27/20 1234    Clinical  Impression Statement Patient is improving in utilizing strategies for deductive reasoning, such as process of elimination. He showed proficiency in comprehension skills relating to verbal analogies. Will continue to work on comprehension of longer sentences and paragraphs.    Speech Therapy Frequency 2x / week    Duration Other (comment)    Treatment/Interventions Patient/family education;Language facilitation    Potential to Achieve Goals Good    Potential Considerations Ability to learn/carryover information;Family/community support;Previous level of function;Cooperation/participation level           Patient will benefit from skilled therapeutic intervention in order to improve the following deficits and impairments:   Aphasia    Problem List Patient Active Problem List   Diagnosis Date Noted  . Seizures ([place]) 05/21/2020  . Depression 01/16/2016  . Cephalalgia 06/03/2015  . Problems influencing health status 06/03/2015  . 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 Angiopathy 06/03/2015  . Completed stroke ([place]) 06/03/2015  . Hypercholesterolemia 06/03/2015    Maylon Cos, Student Intern 05/27/2020, 12:34 PM  [place] MAIN [facility] SERVICES [address] [place], [place], [address] Phone: [phone]   Fax:  [phone]   Name: Johnathan Campbell MRN: [id number] Date of Birth: [birth year]

## 2020-05-29 ENCOUNTER — Encounter: Payer: Self-pay | Admitting: Speech Pathology

## 2020-05-29 ENCOUNTER — Other Ambulatory Visit: Payer: Self-pay

## 2020-05-29 ENCOUNTER — Ambulatory Visit: Payer: PPO | Admitting: Speech Pathology

## 2020-05-29 DIAGNOSIS — R4701 Aphasia: Secondary | ICD-10-CM

## 2020-05-29 NOTE — Therapy (Signed)
[place] MAIN [facility] SERVICES [address] [place], [place], [address] Phone: [phone]   Fax:  [phone]  Speech Language Pathology Treatment/Progress Note  Speech Therapy Progress Note   Dates of reporting period  04/24/2020   to  05/29/2020   Patient Details  Name: Johnathan Campbell MRN: [id number] Date of Birth: 1960/12/27 Referring Provider (SLP): Dr. Manuella Ghazi   Encounter Date: 05/29/2020   End of Session - 05/29/20 1227    Visit Number 50    Number of Visits 65    Date for SLP Re-Evaluation 06/24/20    Authorization Type Medicare    Authorization Time Period Start 04/24/2020    Authorization - Visit Number 10    Authorization - Number of Visits 10    SLP Start Time 1055    SLP Stop Time  1140    SLP Time Calculation (min) 45 min    Activity Tolerance Patient tolerated treatment well           Past Medical History:  Diagnosis Date  . Acute intractable headache    unspecified headache type  . Allergy   . Aphasia S/P CVA   . Clot    blood clot  . Completed stroke ([place])   . Hypercholesterolemia     Past Surgical History:  Procedure Laterality Date  . HERNIA REPAIR  twice    There were no vitals filed for this visit.   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 Patient history is significant for aphasia and seizures. Patient received SLP services in 2017 and has returned because he says his speech is "still not where he wants it to be". Patient has been attending teletherapy sessions with the [facility] ([facility]) due to COVID-19, however, he is searching for individual therapy because he wants more  intensive treatment.       Treatment Provided   Treatment provided Cognitive-Linquistic      Pain Assessment   Pain Assessment No/denies pain      Cognitive-Linquistic Treatment   Treatment focused on Aphasia    Skilled Treatment COMPREHENSION: Read aloud short paragraphs at 80% accuracy. Answered follow-up comprehension questions at 75% accuracy. Completed concrete reasoning task at 65% accuracy with moderate cueing ("If these are the facts we are given, what can we conclude?").      Assessment / Recommendations / Plan   Plan Continue with current plan of care      Progression Toward Goals   Progression toward goals Progressing toward goals            SLP Education - 05/29/20 1226    Education Details Deductive reasoning    Person(s) Educated Patient    Methods Explanation    Comprehension Verbalized understanding              SLP Long Term Goals - 05/29/20 1326      SLP LONG TERM GOAL #1   Title Patient will generate grammatical and cogent sentence to complete abstract/comple linguistic task with 80% accuracy.    Status Partially Met    Target Date 06/24/20  SLP LONG TERM GOAL #2   Title Patient will read aloud sentences and multi-syllabic words, maintaining phonemic accuracy, with 80% accuracy.      Status Partially Met    Target Date 06/24/20      SLP LONG TERM GOAL #3   Title Patient will write grammatical and cogent sentence to complete simple/basic linguistic task with 80% accuracy.    Status Partially Met    Target Date 06/24/20      SLP LONG TERM GOAL #4   Title Patient will complete multi-unit processing tasks with 80% accuracy without the need of repetition of task instructions or significant delays in responding.    Status Partially Met    Target Date 06/24/20      SLP LONG TERM GOAL #5   Title Patient will demonstrate reading comprehension for paragraphs with 80% accuracy.    Status Partially Met    Target Date 06/24/20            Plan  - 05/29/20 1227    Clinical Impression Statement Patient is improving his reading accuracy and is showing improvement in answering questions appropriately (e.g. yes/no questions versus wh- questions). He continues to struggle with auditory comprehension. This will be addressed in future sessions.    Speech Therapy Frequency 2x / week    Duration Other (comment)    Treatment/Interventions Patient/family education;Language facilitation    Potential to Achieve Goals Good    Potential Considerations Ability to learn/carryover information;Family/community support;Previous level of function;Cooperation/participation level           Patient will benefit from skilled therapeutic intervention in order to improve the following deficits and impairments:   Aphasia    Problem List Patient Active Problem List   Diagnosis Date Noted  . Seizures ([place]) 05/21/2020  . Depression 01/16/2016  . Cephalalgia 06/03/2015  . Problems influencing health status 06/03/2015  . 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 Angiopathy 06/03/2015  . Completed stroke ([place]) 06/03/2015  . Hypercholesterolemia 06/03/2015    Lou Miner, Student Intern 05/29/2020, 1:28 PM  [place] MAIN [facility] SERVICES [address] [place], [place], [address] Phone: [phone]   Fax:  [phone]   Name: Johnathan Campbell MRN: [id number] Date of Birth: 03/10/1961

## 2020-06-04 ENCOUNTER — Other Ambulatory Visit: Payer: Self-pay

## 2020-06-04 ENCOUNTER — Ambulatory Visit: Payer: PPO | Attending: Neurology | Admitting: Speech Pathology

## 2020-06-04 DIAGNOSIS — R4701 Aphasia: Secondary | ICD-10-CM

## 2020-06-05 ENCOUNTER — Encounter: Payer: Self-pay | Admitting: Speech Pathology

## 2020-06-05 NOTE — Therapy (Signed)
[place] MAIN [facility] SERVICES [address] [place], [place], [address] Phone: [phone]   Fax:  [phone]  Speech Language Pathology Treatment  Patient Details  Name: Johnathan Campbell MRN: [id number] Date of Birth: Mar 10, 1961 Referring Provider (SLP): Dr. Manuella Ghazi   Encounter Date: 06/04/2020   End of Session - 06/05/20 0932    Visit Number 9    Number of Visits 69    Date for SLP Re-Evaluation 06/24/20    Authorization Type Medicare    Authorization Time Period 06/04/2020    Authorization - Visit Number 1    Progress Note Due on Visit 10    SLP Start Time 1100    SLP Stop Time  1150    SLP Time Calculation (min) 50 min    Activity Tolerance Patient tolerated treatment well           Past Medical History:  Diagnosis Date  . Acute intractable headache    unspecified headache type  . Allergy   . Aphasia S/P CVA   . Clot    blood clot  . Completed stroke ([place])   . Hypercholesterolemia     Past Surgical History:  Procedure Laterality Date  . HERNIA REPAIR  twice    There were no vitals filed for this visit.   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 Patient history is significant for aphasia and seizures. Patient received SLP services in 2017 and has returned because he says his speech is "still not where he wants it to be". Patient has been attending teletherapy sessions with the [facility] ([facility]) due to COVID-19, however, he is searching for individual therapy because he wants more intensive treatment.       Treatment Provided   Treatment provided Cognitive-Linquistic      Pain Assessment   Pain  Assessment No/denies pain      Cognitive-Linquistic Treatment   Treatment focused on Aphasia    Skilled Treatment READING COMPREHNSION: After specific training using written strategies, patient completed 2 deductive reasoning puzzles independently.  AUDITORY COMPRHENSION: Understanding sequences in paragraphs: Patient took notes while listening to a short paragraph containing sequence of events read aloud 2 times.  Answered questions RE: sequences in the paragraph given repetition of question.      Assessment / Recommendations / Plan   Plan Continue with current plan of care      Progression Toward Goals   Progression toward goals Progressing toward goals            SLP Education - 06/05/20 0932    Education Details Tips for improving reading and auditory comprehension    Person(s) Educated Patient    Methods Explanation    Comprehension Verbalized understanding              SLP Long Term Goals - 05/29/20 1326      SLP LONG TERM GOAL #1   Title Patient will generate grammatical and cogent sentence to complete abstract/comple linguistic task with 80% accuracy.    Status Partially Met    Target Date 06/24/20  SLP LONG TERM GOAL #2   Title Patient will read aloud sentences and multi-syllabic words, maintaining phonemic accuracy, with 80% accuracy.      Status Partially Met    Target Date 06/24/20      SLP LONG TERM GOAL #3   Title Patient will write grammatical and cogent sentence to complete simple/basic linguistic task with 80% accuracy.    Status Partially Met    Target Date 06/24/20      SLP LONG TERM GOAL #4   Title Patient will complete multi-unit processing tasks with 80% accuracy without the need of repetition of task instructions or significant delays in responding.    Status Partially Met    Target Date 06/24/20      SLP LONG TERM GOAL #5   Title Patient will demonstrate reading comprehension for paragraphs with 80% accuracy.    Status Partially Met     Target Date 06/24/20            Plan - 06/05/20 0933    Clinical Impression Statement The patient is improving in reading and auditory comprehension and is gaining insight into his deficits in these areas and impact on daily communication.  Will continue ST with focus on comprehension skills.    Speech Therapy Frequency 2x / week    Duration Other (comment)    Treatment/Interventions Patient/family education;Language facilitation    Potential to Achieve Goals Good    Potential Considerations Ability to learn/carryover information;Family/community support;Previous level of function;Cooperation/participation level    Consulted and Agree with Plan of Care Patient           Patient will benefit from skilled therapeutic intervention in order to improve the following deficits and impairments:   Aphasia    Problem List Patient Active Problem List   Diagnosis Date Noted  . Seizures ([place]) 05/21/2020  . Depression 01/16/2016  . Cephalalgia 06/03/2015  . Problems influencing health status 06/03/2015  . 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 Angiopathy 06/03/2015  . Completed stroke ([place]) 06/03/2015  . Hypercholesterolemia 06/03/2015   [name], MS/CCC- SLP  Lou Miner 06/05/2020, 9:36 AM  [place] MAIN [facility] SERVICES [address] [place], [place], [address] Phone: [phone]   Fax:  [phone]   Name: ELISEO WITHERS MRN: [id number] Date of Birth: [birth year]

## 2020-06-06 ENCOUNTER — Ambulatory Visit: Payer: PPO | Admitting: Speech Pathology

## 2020-06-10 ENCOUNTER — Encounter: Payer: Self-pay | Admitting: Speech Pathology

## 2020-06-10 ENCOUNTER — Ambulatory Visit: Payer: PPO | Admitting: Speech Pathology

## 2020-06-10 ENCOUNTER — Other Ambulatory Visit: Payer: Self-pay

## 2020-06-10 DIAGNOSIS — R4701 Aphasia: Secondary | ICD-10-CM | POA: Diagnosis not present

## 2020-06-10 NOTE — Therapy (Signed)
[place] MAIN [facility] SERVICES [address] [place], [place], [address] Phone: [phone]   Fax:  [phone]  Speech Language Pathology Treatment  Patient Details  Name: Johnathan Campbell MRN: [id number] Date of Birth: [birth year] Referring Provider (SLP): Dr. Manuella Ghazi   Encounter Date: 06/10/2020   End of Session - 06/10/20 1130    Visit Number 6    Number of Visits 87    Date for SLP Re-Evaluation 06/24/20    Authorization Type Medicare    Authorization Time Period 06/04/2020    Authorization - Visit Number 2    Progress Note Due on Visit 10    SLP Start Time 1000    SLP Stop Time  1050    SLP Time Calculation (min) 50 min    Activity Tolerance Patient tolerated treatment well           Past Medical History:  Diagnosis Date   Acute intractable headache    unspecified headache type   Allergy    Aphasia S/P CVA    Clot    blood clot   Completed stroke ([place])    Hypercholesterolemia     Past Surgical History:  Procedure Laterality Date   HERNIA REPAIR  twice    There were no vitals filed for this visit.   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 Patient history is significant for aphasia and seizures. Patient received SLP services in 2017 and has returned because he says his speech is "still not where he wants it to be". Patient has been attending teletherapy sessions with the [facility] ([facility]) due to COVID-19, however, he is searching for individual therapy because he wants more intensive treatment.       Treatment Provided   Treatment provided Cognitive-Linquistic      Pain Assessment   Pain  Assessment No/denies pain      Cognitive-Linquistic Treatment   Treatment focused on Aphasia    Skilled Treatment READING COMPREHNSION: Patient completed 1 deductive reasoning puzzles independently.  AUDITORY COMPRHENSION: Understanding sequences in paragraphs: Patient took notes while listening to a short paragraph containing sequence of events read aloud 2 times.  Answered questions RE: sequences in the paragraph given repetition of question.      Assessment / Recommendations / Plan   Plan Continue with current plan of care      Progression Toward Goals   Progression toward goals Progressing toward goals            SLP Education - 06/10/20 1130    Education Details tips for improving reading and auditory comprehension    Person(s) Educated Patient    Methods Explanation    Comprehension Verbalized understanding              SLP Long Term Goals - 05/29/20 1326      SLP LONG TERM GOAL #1   Title Patient will generate grammatical and cogent sentence to complete abstract/comple linguistic task with 80% accuracy.    Status Partially Met    Target Date 06/24/20      SLP LONG TERM GOAL #2  Title Patient will read aloud sentences and multi-syllabic words, maintaining phonemic accuracy, with 80% accuracy.      Status Partially Met    Target Date 06/24/20      SLP LONG TERM GOAL #3   Title Patient will write grammatical and cogent sentence to complete simple/basic linguistic task with 80% accuracy.    Status Partially Met    Target Date 06/24/20      SLP LONG TERM GOAL #4   Title Patient will complete multi-unit processing tasks with 80% accuracy without the need of repetition of task instructions or significant delays in responding.    Status Partially Met    Target Date 06/24/20      SLP LONG TERM GOAL #5   Title Patient will demonstrate reading comprehension for paragraphs with 80% accuracy.    Status Partially Met    Target Date 06/24/20            Plan -  06/10/20 1131    Clinical Impression Statement The patient is improving in reading and auditory comprehension and is gaining insight into his deficits in these areas and impact on daily communication.  Will continue ST with focus on comprehension skills.    Speech Therapy Frequency 2x / week    Duration Other (comment)    Treatment/Interventions Patient/family education;Language facilitation    Potential Considerations Ability to learn/carryover information;Family/community support;Previous level of function;Cooperation/participation level    Consulted and Agree with Plan of Care Patient           Patient will benefit from skilled therapeutic intervention in order to improve the following deficits and impairments:   Aphasia    Problem List Patient Active Problem List   Diagnosis Date Noted   Seizures ([place]) 05/21/2020   Depression 01/16/2016   Cephalalgia 06/03/2015   Problems influencing health status 06/03/2015   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 [place], [place], [address] Phone: [phone]   Fax:  [phone]   Name: Johnathan Campbell MRN: [id number] Date of Birth: 26-Jul-1961

## 2020-06-13 ENCOUNTER — Encounter: Payer: Self-pay | Admitting: Speech Pathology

## 2020-06-13 ENCOUNTER — Other Ambulatory Visit: Payer: Self-pay

## 2020-06-13 ENCOUNTER — Ambulatory Visit: Payer: PPO | Admitting: Speech Pathology

## 2020-06-13 DIAGNOSIS — R4701 Aphasia: Secondary | ICD-10-CM

## 2020-06-13 NOTE — Therapy (Signed)
[place] MAIN [facility] SERVICES [address] [place], [place], [address] Phone: [phone]   Fax:  [phone]  Speech Language Pathology Treatment  Patient Details  Name: Johnathan Campbell MRN: [id number] Date of Birth: Mar 18, 1961 Referring Provider (SLP): Dr. Manuella Ghazi   Encounter Date: 06/13/2020   End of Session - 06/13/20 1235    Visit Number 54    Number of Visits 23    Date for SLP Re-Evaluation 06/24/20    Authorization Type Medicare    Authorization Time Period 06/04/2020    Authorization - Visit Number 3    Progress Note Due on Visit 10    SLP Start Time 0900    SLP Stop Time  1000    SLP Time Calculation (min) 60 min    Activity Tolerance Patient tolerated treatment well           Past Medical History:  Diagnosis Date  . Acute intractable headache    unspecified headache type  . Allergy   . Aphasia S/P CVA   . Clot    blood clot  . Completed stroke ([place])   . Hypercholesterolemia     Past Surgical History:  Procedure Laterality Date  . HERNIA REPAIR  twice    There were no vitals filed for this visit.   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 Patient history is significant for aphasia and seizures. Patient received SLP services in 2017 and has returned because he says his speech is "still not where he wants it to be". Patient has been attending teletherapy sessions with the [facility] ([facility]) due to COVID-19, however, he is searching for individual therapy because he wants more intensive treatment.       Treatment Provided   Treatment provided Cognitive-Linquistic      Pain Assessment   Pain  Assessment No/denies pain      Cognitive-Linquistic Treatment   Treatment focused on Aphasia    Skilled Treatment READING COMPREHNSION: Patient completed 1 perplexor logic problem with mod-max cues and 2 more with min cues.  AUDITORY COMPRHENSION: Answer inferential questions RE: paragraphs read aloud with 80% for main gist and 25% for specific details to support the answer.      Assessment / Recommendations / Plan   Plan Continue with current plan of care      Progression Toward Goals   Progression toward goals Progressing toward goals            SLP Education - 06/13/20 1235    Education Details strategies to improve reading comprehension    Person(s) Educated Patient    Methods Explanation    Comprehension Verbalized understanding              SLP Long Term Goals - 05/29/20 1326      SLP LONG TERM GOAL #1   Title Patient will generate grammatical and cogent sentence to complete abstract/comple linguistic task with 80% accuracy.    Status Partially Met    Target Date 06/24/20      SLP LONG TERM GOAL #2   Title Patient will read  aloud sentences and multi-syllabic words, maintaining phonemic accuracy, with 80% accuracy.      Status Partially Met    Target Date 06/24/20      SLP LONG TERM GOAL #3   Title Patient will write grammatical and cogent sentence to complete simple/basic linguistic task with 80% accuracy.    Status Partially Met    Target Date 06/24/20      SLP LONG TERM GOAL #4   Title Patient will complete multi-unit processing tasks with 80% accuracy without the need of repetition of task instructions or significant delays in responding.    Status Partially Met    Target Date 06/24/20      SLP LONG TERM GOAL #5   Title Patient will demonstrate reading comprehension for paragraphs with 80% accuracy.    Status Partially Met    Target Date 06/24/20            Plan - 06/13/20 1236    Clinical Impression Statement The patient is improving in reading  and auditory comprehension and is gaining insight into his deficits in these areas and impact on daily communication.  Will continue ST with focus on comprehension skills.    Speech Therapy Frequency 2x / week    Duration Other (comment)    Treatment/Interventions Patient/family education;Language facilitation    Potential to Achieve Goals Good    Potential Considerations Ability to learn/carryover information;Family/community support;Previous level of function;Cooperation/participation level    Consulted and Agree with Plan of Care Patient           Patient will benefit from skilled therapeutic intervention in order to improve the following deficits and impairments:   Aphasia    Problem List Patient Active Problem List   Diagnosis Date Noted  . Seizures ([place]) 05/21/2020  . Depression 01/16/2016  . Cephalalgia 06/03/2015  . Problems influencing health status 06/03/2015  . 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 Angiopathy 06/03/2015  . Completed stroke ([place]) 06/03/2015  . Hypercholesterolemia 06/03/2015   [name], MS/CCC- SLP  Lou Miner 06/13/2020, 12:37 PM  [place] MAIN [facility] SERVICES [address] [place], [place], [address] Phone: [phone]   Fax:  [phone]   Name: ROSS BENDER MRN: [id number] Date of Birth: January 11, 1961

## 2020-06-17 ENCOUNTER — Ambulatory Visit: Payer: PPO | Admitting: Speech Pathology

## 2020-06-17 ENCOUNTER — Telehealth: Payer: Self-pay

## 2020-06-17 ENCOUNTER — Other Ambulatory Visit: Payer: Self-pay

## 2020-06-17 ENCOUNTER — Encounter: Payer: Self-pay | Admitting: Speech Pathology

## 2020-06-17 DIAGNOSIS — R4701 Aphasia: Secondary | ICD-10-CM | POA: Diagnosis not present

## 2020-06-17 NOTE — Telephone Encounter (Signed)
Left message for patients mother letting her know that referral was placed in last month and that she can contact the number below with any further questions Referral sent to [facility]. Their office will contact pt Phone: [phone]. KW  Copied from [place] [phone]. Topic: General - Call Back - No Documentation >> Jun 17, 2020 10:03 AM Jaynie Collins D wrote: Reason for CRM: Patient's mother called and wanted to know when he will be scheduled for his colonoscopy. Please call patient's mother with resolution.

## 2020-06-17 NOTE — Therapy (Signed)
[place] MAIN [facility] SERVICES [address] [place], [place], [address] Phone: [phone]   Fax:  [phone]  Speech Language Pathology Treatment  Patient Details  Name: Johnathan Campbell MRN: [id number] Date of Birth: 1961/08/02 Referring Provider (SLP): Dr. Manuella Ghazi   Encounter Date: 06/17/2020   End of Session - 06/17/20 1153    Visit Number 54    Number of Visits 64    Date for SLP Re-Evaluation 06/24/20    Authorization Type Medicare    Authorization Time Period 06/04/2020    Authorization - Visit Number 4    Progress Note Due on Visit 10    SLP Start Time 1000    SLP Stop Time  1100    SLP Time Calculation (min) 60 min    Activity Tolerance Patient tolerated treatment well           Past Medical History:  Diagnosis Date  . Acute intractable headache    unspecified headache type  . Allergy   . Aphasia S/P CVA   . Clot    blood clot  . Completed stroke ([place])   . Hypercholesterolemia     Past Surgical History:  Procedure Laterality Date  . HERNIA REPAIR  twice    There were no vitals filed for this visit.   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 Patient history is significant for aphasia and seizures. Patient received SLP services in 2017 and has returned because he says his speech is "still not where he wants it to be". Patient has been attending teletherapy sessions with the [facility] (TAP) due to COVID-19, however, he is searching for individual therapy because he wants more intensive treatment.       Treatment Provided   Treatment provided Cognitive-Linquistic      Pain Assessment   Pain  Assessment No/denies pain      Cognitive-Linquistic Treatment   Treatment focused on Aphasia    Skilled Treatment READING COMPREHNSION: Patient completed 2 Perplexor puzzles with min cues.  AUDITORY COMPRHENSION: Answer WHO/WHEN/WHERE questions RE: short paragraphs given mod-max cues.  He typically can state what has occurred.       Assessment / Recommendations / Plan   Plan Continue with current plan of care      Progression Toward Goals   Progression toward goals Progressing toward goals            SLP Education - 06/17/20 1153    Education Details strategies to improve functional auditory comprehension    Person(s) Educated Patient    Methods Explanation    Comprehension Verbalized understanding              SLP Long Term Goals - 05/29/20 1326      SLP LONG TERM GOAL #1   Title Patient will generate grammatical and cogent sentence to complete abstract/comple linguistic task with 80% accuracy.    Status Partially Met    Target Date 06/24/20      SLP LONG TERM GOAL #2   Title Patient will read aloud sentences and multi-syllabic words, maintaining phonemic accuracy, with  80% accuracy.      Status Partially Met    Target Date 06/24/20      SLP LONG TERM GOAL #3   Title Patient will write grammatical and cogent sentence to complete simple/basic linguistic task with 80% accuracy.    Status Partially Met    Target Date 06/24/20      SLP LONG TERM GOAL #4   Title Patient will complete multi-unit processing tasks with 80% accuracy without the need of repetition of task instructions or significant delays in responding.    Status Partially Met    Target Date 06/24/20      SLP LONG TERM GOAL #5   Title Patient will demonstrate reading comprehension for paragraphs with 80% accuracy.    Status Partially Met    Target Date 06/24/20            Plan - 06/17/20 1154    Clinical Impression Statement The patient is improving in reading and auditory comprehension and is  gaining insight into his deficits in these areas and impact on daily communication.  Will continue ST with focus on comprehension skills.    Speech Therapy Frequency 2x / week    Duration Other (comment)    Treatment/Interventions Patient/family education;Language facilitation    Potential to Achieve Goals Good    Potential Considerations Ability to learn/carryover information;Family/community support;Previous level of function;Cooperation/participation level    Consulted and Agree with Plan of Care Patient           Patient will benefit from skilled therapeutic intervention in order to improve the following deficits and impairments:   Aphasia    Problem List Patient Active Problem List   Diagnosis Date Noted  . Seizures ([place]) 05/21/2020  . Depression 01/16/2016  . Cephalalgia 06/03/2015  . Problems influencing health status 06/03/2015  . 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 Angiopathy 06/03/2015  . Completed stroke ([place]) 06/03/2015  . Hypercholesterolemia 06/03/2015   [name], MS/CCC- SLP  Lou Miner 06/17/2020, 11:55 AM  [place] MAIN [facility] SERVICES [address] [place], [place], [address] Phone: [phone]   Fax:  [phone]   Name: Johnathan Campbell MRN: [id number] Date of Birth: Jan 19, 1961

## 2020-06-20 ENCOUNTER — Other Ambulatory Visit: Payer: Self-pay

## 2020-06-20 ENCOUNTER — Encounter: Payer: Self-pay | Admitting: Speech Pathology

## 2020-06-20 ENCOUNTER — Ambulatory Visit: Payer: PPO | Admitting: Speech Pathology

## 2020-06-20 DIAGNOSIS — R4701 Aphasia: Secondary | ICD-10-CM | POA: Diagnosis not present

## 2020-06-20 NOTE — Therapy (Signed)
[place] MAIN [facility] SERVICES [address] [place], [place], [address] Phone: [phone]   Fax:  [phone]  Speech Language Pathology Treatment  Patient Details  Name: Johnathan Campbell MRN: [id number] Date of Birth: 04/06/1961 Referring Provider (SLP): Dr. Manuella Ghazi   Encounter Date: 06/20/2020   End of Session - 06/20/20 1425    Visit Number 72    Number of Visits 70    Date for SLP Re-Evaluation 06/24/20    Authorization Type Medicare    Authorization Time Period 06/04/2020    Authorization - Visit Number 5    Progress Note Due on Visit 10    SLP Start Time 0900    SLP Stop Time  0950    SLP Time Calculation (min) 50 min    Activity Tolerance Patient tolerated treatment well           Past Medical History:  Diagnosis Date  . Acute intractable headache    unspecified headache type  . Allergy   . Aphasia S/P CVA   . Clot    blood clot  . Completed stroke ([place])   . Hypercholesterolemia     Past Surgical History:  Procedure Laterality Date  . HERNIA REPAIR  twice    There were no vitals filed for this visit.   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 Patient history is significant for aphasia and seizures. Patient received SLP services in 2017 and has returned because he says his speech is "still not where he wants it to be". Patient has been attending teletherapy sessions with the [facility] (TAP) due to COVID-19, however, he is searching for individual therapy because he wants more intensive treatment.       Treatment Provided   Treatment provided Cognitive-Linquistic      Pain Assessment   Pain  Assessment No/denies pain      Cognitive-Linquistic Treatment   Treatment focused on Aphasia    Skilled Treatment READING COMPREHNSION: Patient completed 2 Perplexor puzzles with min-to-mod cues.  AUDITORY COMPRHENSION: Answer WHO questions RE: short paragraphs given mod-max cues.  Patient reports that he frequently has trouble following conversation due to his difficulty easily hearing who is involved.      Assessment / Recommendations / Plan   Plan Continue with current plan of care      Progression Toward Goals   Progression toward goals Progressing toward goals            SLP Education - 06/20/20 1424    Education Details strategies to improve functional auditory comprehension    Person(s) Educated Patient    Methods Explanation    Comprehension Verbalized understanding              SLP Long Term Goals - 05/29/20 1326      SLP LONG TERM GOAL #1   Title Patient will generate grammatical and cogent sentence to complete abstract/comple linguistic task with 80% accuracy.    Status Partially Met    Target Date 06/24/20      SLP LONG TERM GOAL #2   Title Patient will  read aloud sentences and multi-syllabic words, maintaining phonemic accuracy, with 80% accuracy.      Status Partially Met    Target Date 06/24/20      SLP LONG TERM GOAL #3   Title Patient will write grammatical and cogent sentence to complete simple/basic linguistic task with 80% accuracy.    Status Partially Met    Target Date 06/24/20      SLP LONG TERM GOAL #4   Title Patient will complete multi-unit processing tasks with 80% accuracy without the need of repetition of task instructions or significant delays in responding.    Status Partially Met    Target Date 06/24/20      SLP LONG TERM GOAL #5   Title Patient will demonstrate reading comprehension for paragraphs with 80% accuracy.    Status Partially Met    Target Date 06/24/20            Plan - 06/20/20 1425    Clinical Impression  Statement The patient is improving in reading and auditory comprehension and is gaining insight into his deficits in these areas and impact on daily communication.  Will continue ST with focus on comprehension skills.    Speech Therapy Frequency 2x / week    Duration Other (comment)    Treatment/Interventions Patient/family education;Language facilitation    Potential to Achieve Goals Good    Potential Considerations Ability to learn/carryover information;Family/community support;Previous level of function;Cooperation/participation level    Consulted and Agree with Plan of Care Patient           Patient will benefit from skilled therapeutic intervention in order to improve the following deficits and impairments:   Aphasia    Problem List Patient Active Problem List   Diagnosis Date Noted  . Seizures ([place]) 05/21/2020  . Depression 01/16/2016  . Cephalalgia 06/03/2015  . Problems influencing health status 06/03/2015  . 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 Angiopathy 06/03/2015  . Completed stroke ([place]) 06/03/2015  . Hypercholesterolemia 06/03/2015   [name], MS/CCC- SLP  Lou Miner 06/20/2020, 2:26 PM  [place] MAIN [facility] SERVICES [address] [place], [place], [address] Phone: [phone]   Fax:  [phone]   Name: Johnathan Campbell MRN: [id number] Date of Birth: [birth year]

## 2020-06-21 ENCOUNTER — Telehealth: Payer: Self-pay

## 2020-06-21 NOTE — Telephone Encounter (Signed)
Spoke to patient's mother and she said that Johnathan Campbell will fax [place] the results from the patient's last colonoscopy because patient was told that he was due but he has just had one in January of this year. So I told her we will keep an eye out for that fax.

## 2020-06-21 NOTE — Telephone Encounter (Signed)
Copied from [place] [id number]. Topic: General - Inquiry >> Jun 21, 2020  9:11 AM Scherrie Gerlach wrote: Reason for CRM: mom says she got yesterday and was told the pt needs to call Dr Rosanna Randy office, and could not speak to her. She says due to pt's condition, she usually takes calls on his behalf.   Pt will be at home another 15 min this am

## 2020-06-24 ENCOUNTER — Ambulatory Visit: Payer: PPO | Admitting: Speech Pathology

## 2020-06-26 ENCOUNTER — Other Ambulatory Visit: Payer: Self-pay

## 2020-06-26 ENCOUNTER — Ambulatory Visit: Payer: PPO | Admitting: Speech Pathology

## 2020-06-26 ENCOUNTER — Encounter: Payer: Self-pay | Admitting: Speech Pathology

## 2020-06-26 DIAGNOSIS — R4701 Aphasia: Secondary | ICD-10-CM

## 2020-06-26 DIAGNOSIS — D3131 Benign neoplasm of right choroid: Secondary | ICD-10-CM | POA: Diagnosis not present

## 2020-06-26 NOTE — Therapy (Signed)
[place] MAIN [facility] SERVICES [address] [place], [place], [address] Phone: [phone]   Fax:  [phone]  Speech Language Pathology Treatment/Re-Certification  Patient Details  Name: Johnathan Campbell MRN: [id number] Date of Birth: 1960-11-25 Referring Provider (SLP): Dr. Manuella Ghazi   Encounter Date: 06/26/2020   End of Session - 06/26/20 1506    Visit Number 71    Number of Visits 65    Date for SLP Re-Evaluation 09/19/20    Authorization Type Medicare    Authorization Time Period 06/04/2020    Authorization - Visit Number 6    Progress Note Due on Visit 10    SLP Start Time 0900    SLP Stop Time  0950    SLP Time Calculation (min) 50 min    Activity Tolerance Patient tolerated treatment well           Past Medical History:  Diagnosis Date  . Acute intractable headache    unspecified headache type  . Allergy   . Aphasia S/P CVA   . Clot    blood clot  . Completed stroke ([place])   . Hypercholesterolemia     Past Surgical History:  Procedure Laterality Date  . HERNIA REPAIR  twice    There were no vitals filed for this visit.   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 Patient history is significant for aphasia and seizures. Patient received SLP services in 2017 and has returned because he says his speech is "still not where he wants it to be". Patient has been attending teletherapy sessions with the [facility] (TAP) due to COVID-19, however, he is searching for individual therapy because he wants more intensive treatment.       Treatment Provided   Treatment provided Cognitive-Linquistic      Pain  Assessment   Pain Assessment No/denies pain      Cognitive-Linquistic Treatment   Treatment focused on Aphasia    Skilled Treatment READING COMPREHNSION: Patient completed 1 Perplexor puzzles with min-to-mod cues.  AUDITORY COMPRHENSION: Answer WHERE questions RE: short paragraphs given min cues.  Patient reports that he frequently has trouble following conversation due to his difficulty easily hearing who is involved.  VERBAL EXPRESSION: Complete Semantic Feature Analysis diagram with mod cues.      Assessment / Recommendations / Plan   Plan Continue with current plan of care      Progression Toward Goals   Progression toward goals Progressing toward goals            SLP Education - 06/26/20 1505    Education Details strategies to improve auditory comprehension    Person(s) Educated Patient    Methods Explanation    Comprehension Verbalized understanding              SLP Long Term Goals - 06/26/20 1509      SLP LONG TERM GOAL #1   Title Patient will generate grammatical and cogent sentence to complete abstract/comple linguistic task with 80% accuracy.    Time 12    Period Weeks    Status Partially Met  Target Date 09/19/20      SLP LONG TERM GOAL #2   Title Patient will read aloud sentences and multi-syllabic words, maintaining phonemic accuracy, with 80% accuracy.      Time 12    Period Weeks    Status Partially Met    Target Date 09/19/20      SLP LONG TERM GOAL #3   Title Patient will write grammatical and cogent sentence to complete simple/basic linguistic task with 80% accuracy.    Time 12    Period Weeks    Status Partially Met    Target Date 09/12/20      SLP LONG TERM GOAL #4   Title Patient will complete multi-unit processing tasks with 80% accuracy without the need of repetition of task instructions or significant delays in responding.    Time 12    Period Weeks    Status Partially Met    Target Date 09/12/20      SLP LONG TERM GOAL #5   Title  Patient will demonstrate reading comprehension for paragraphs with 80% accuracy.    Time 12    Period Weeks    Status Partially Met    Target Date 09/19/20            Plan - 06/26/20 1509    Clinical Impression Statement The patient is improving in reading and auditory comprehension and is gaining insight into his deficits in these areas and impact on daily communication.  Will continue ST with focus on comprehension skills.    Speech Therapy Frequency 2x / week    Duration Other (comment)    Treatment/Interventions Patient/family education;Language facilitation    Potential to Achieve Goals Good    Potential Considerations Ability to learn/carryover information;Family/community support;Previous level of function;Cooperation/participation level    Consulted and Agree with Plan of Care Patient           Patient will benefit from skilled therapeutic intervention in order to improve the following deficits and impairments:   Aphasia - Plan: SLP plan of care cert/re-cert    Problem List Patient Active Problem List   Diagnosis Date Noted  . Seizures ([place]) 05/21/2020  . Depression 01/16/2016  . Cephalalgia 06/03/2015  . Problems influencing health status 06/03/2015  . 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 Angiopathy 06/03/2015  . Completed stroke ([place]) 06/03/2015  . Hypercholesterolemia 06/03/2015   [name], MS/CCC- SLP  Lou Miner 06/26/2020, 3:13 PM  [place] MAIN [facility] SERVICES [address] [place], [place], [address] Phone: [phone]   Fax:  [phone]   Name: Johnathan Campbell MRN: [id number] Date of Birth: [birth year]

## 2020-07-01 ENCOUNTER — Encounter: Payer: Self-pay | Admitting: Speech Pathology

## 2020-07-01 ENCOUNTER — Ambulatory Visit: Payer: PPO | Admitting: Speech Pathology

## 2020-07-01 ENCOUNTER — Other Ambulatory Visit: Payer: Self-pay

## 2020-07-01 DIAGNOSIS — R4701 Aphasia: Secondary | ICD-10-CM | POA: Diagnosis not present

## 2020-07-01 NOTE — Therapy (Signed)
[place] MAIN [facility] SERVICES [address] [place], [place], [address] Phone: [phone]   Fax:  [phone]  Speech Language Pathology Treatment  Patient Details  Name: Johnathan Campbell MRN: [id number] Date of Birth: [birth year] Referring Provider (SLP): Dr. Manuella Ghazi   Encounter Date: 07/01/2020   End of Session - 07/01/20 1326    Visit Number 56    Number of Visits 37    Date for SLP Re-Evaluation 09/19/20    Authorization Type Medicare    Authorization Time Period 06/04/2020    Authorization - Visit Number 7    Authorization - Number of Visits 10    Progress Note Due on Visit 10    SLP Start Time 1100    SLP Stop Time  1150    SLP Time Calculation (min) 50 min    Activity Tolerance Patient tolerated treatment well           Past Medical History:  Diagnosis Date  . Acute intractable headache    unspecified headache type  . Allergy   . Aphasia S/P CVA   . Clot    blood clot  . Completed stroke ([place])   . Hypercholesterolemia     Past Surgical History:  Procedure Laterality Date  . HERNIA REPAIR  twice    There were no vitals filed for this visit.   Subjective Assessment - 07/01/20 1325    Subjective "What's the word"    Currently in Pain? 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 Patient history is significant for aphasia and seizures. Patient received SLP services in 2017 and has returned because he says his speech is "still not where he wants it to be". Patient has been attending teletherapy sessions with the [facility] (TAP) due to COVID-19, however, he is searching for  individual therapy because he wants more intensive treatment.       Treatment Provided   Treatment provided Cognitive-Linquistic      Cognitive-Linquistic Treatment   Treatment focused on Aphasia    Skilled Treatment READING COMPREHENSION: Patient completed 1 Perplexor puzzle with mod-max cues. Patient answered one WHEN question RE: short paragraphs when he was able to read.  AUD COMPREHENSION: Patient had difficulty answering WHEN questions RE: short paragraphs, but was successful when reading. EXPRESSION: Patient completed 3 Semantic Feature Analysis diagrams with min cues verbally and was able to write cogent words given min cues.       Assessment / Recommendations / Plan   Plan Continue with current plan of care      Progression Toward Goals   Progression toward goals Progressing toward goals            SLP Education - 07/01/20 1326    Education Details Strategies to improve auditory comprehension, problem solving              SLP Long Term Goals - 06/26/20 1509      SLP LONG TERM GOAL #1   Title Patient will generate grammatical and cogent sentence  to complete abstract/comple linguistic task with 80% accuracy.    Time 12    Period Weeks    Status Partially Met    Target Date 09/19/20      SLP LONG TERM GOAL #2   Title Patient will read aloud sentences and multi-syllabic words, maintaining phonemic accuracy, with 80% accuracy.      Time 12    Period Weeks    Status Partially Met    Target Date 09/19/20      SLP LONG TERM GOAL #3   Title Patient will write grammatical and cogent sentence to complete simple/basic linguistic task with 80% accuracy.    Time 12    Period Weeks    Status Partially Met    Target Date 09/12/20      SLP LONG TERM GOAL #4   Title Patient will complete multi-unit processing tasks with 80% accuracy without the need of repetition of task instructions or significant delays in responding.    Time 12    Period Weeks    Status Partially Met     Target Date 09/12/20      SLP LONG TERM GOAL #5   Title Patient will demonstrate reading comprehension for paragraphs with 80% accuracy.    Time 12    Period Weeks    Status Partially Met    Target Date 09/19/20            Plan - 07/01/20 1327    Clinical Impression Statement Patient continues to improve with reading, writing, and problem solving skills. Continue to target functional writing with skilled ST. Discuss additional strategies to improve auditory comprehension skills/ focus on necessary information.    Speech Therapy Frequency 2x / week    Duration Other (comment)    Treatment/Interventions Patient/family education;Language facilitation    Potential to Achieve Goals Good    Potential Considerations Ability to learn/carryover information;Family/community support;Previous level of function;Cooperation/participation level    Consulted and Agree with Plan of Care Patient           Patient will benefit from skilled therapeutic intervention in order to improve the following deficits and impairments:   Aphasia    Problem List Patient Active Problem List   Diagnosis Date Noted  . Seizures ([place]) 05/21/2020  . Depression 01/16/2016  . Cephalalgia 06/03/2015  . Problems influencing health status 06/03/2015  . 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 Angiopathy 06/03/2015  . Completed stroke ([place]) 06/03/2015  . Hypercholesterolemia 06/03/2015    [name], BS Graduate Clinician Lou Miner 07/01/2020, 1:38 PM  [place] MAIN [facility] SERVICES [address] [place], [place], [address] Phone: [phone]   Fax:  [phone]   Name: Johnathan Campbell MRN: [id number] Date of Birth: 09-27-1961

## 2020-07-03 ENCOUNTER — Other Ambulatory Visit: Payer: Self-pay

## 2020-07-03 ENCOUNTER — Encounter: Payer: Self-pay | Admitting: Speech Pathology

## 2020-07-03 ENCOUNTER — Ambulatory Visit: Payer: PPO | Attending: Neurology | Admitting: Speech Pathology

## 2020-07-03 DIAGNOSIS — R41841 Cognitive communication deficit: Secondary | ICD-10-CM | POA: Insufficient documentation

## 2020-07-03 DIAGNOSIS — R4701 Aphasia: Secondary | ICD-10-CM | POA: Diagnosis not present

## 2020-07-03 NOTE — Therapy (Signed)
[place] MAIN [facility] SERVICES [address] [place], [place], [address] Phone: [phone]   Fax:  [phone]  Speech Language Pathology Treatment  Patient Details  Name: Johnathan Campbell MRN: [id number] Date of Birth: [birth year] Referring Provider (SLP): Dr. Manuella Ghazi   Encounter Date: 07/03/2020   End of Session - 07/03/20 1342    Visit Number 60    Number of Visits 18    Date for SLP Re-Evaluation 09/19/20    Authorization Type Medicare    Authorization Time Period 06/04/2020    Authorization - Visit Number 8    Authorization - Number of Visits 10    Progress Note Due on Visit 10    SLP Start Time 1000    SLP Stop Time  1050    SLP Time Calculation (min) 50 min    Activity Tolerance Patient tolerated treatment well           Past Medical History:  Diagnosis Date  . Acute intractable headache    unspecified headache type  . Allergy   . Aphasia S/P CVA   . Clot    blood clot  . Completed stroke ([place])   . Hypercholesterolemia     Past Surgical History:  Procedure Laterality Date  . HERNIA REPAIR  twice    There were no vitals filed for this visit.   Subjective Assessment - 07/03/20 1340    Subjective "I know the word"    Currently in Pain? 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 Patient history is significant for aphasia and seizures. Patient received SLP services in 2017 and has returned because he says his speech is "still not where he wants it to be". Patient has been attending teletherapy sessions with the [facility] ([facility]) due to COVID-19, however, he is searching for individual therapy because he wants more intensive treatment.       Treatment Provided    Treatment provided Cognitive-Linquistic      Cognitive-Linquistic Treatment   Treatment focused on Aphasia    Skilled Treatment READING COMPREHENSION: Patient completed 1 Perplexor puzzle with mod cues from clinician mainly in the form of reminders to reread/ organize given information. EXPRESSION: Patient completed 4 Semantic Feature Analysis charts with min cues to encourage elaboration verbally, and was able to write words with min verbal cues. Patient is able to use a clinician model to perfect his writing. Patient's overall cogency improves when asked for clarification or clues about the topic he is discussing. Some phonemic paraphasias in speaking and writing.       Assessment / Recommendations / Plan   Plan Continue with current plan of care      Progression Toward Goals   Progression toward goals Progressing toward goals            SLP Education - 07/03/20 1341    Education Details Strategies to improve problem solving/ executive functioning, "Boogie Board" for writing    Person(s) Educated Patient    Methods Explanation;Demonstration    Comprehension Verbalized understanding              SLP Long Term Goals - 06/26/20 1509  SLP LONG TERM GOAL #1   Title Patient will generate grammatical and cogent sentence to complete abstract/comple linguistic task with 80% accuracy.    Time 12    Period Weeks    Status Partially Met    Target Date 09/19/20      SLP LONG TERM GOAL #2   Title Patient will read aloud sentences and multi-syllabic words, maintaining phonemic accuracy, with 80% accuracy.      Time 12    Period Weeks    Status Partially Met    Target Date 09/19/20      SLP LONG TERM GOAL #3   Title Patient will write grammatical and cogent sentence to complete simple/basic linguistic task with 80% accuracy.    Time 12    Period Weeks    Status Partially Met    Target Date 09/12/20      SLP LONG TERM GOAL #4   Title Patient will complete multi-unit  processing tasks with 80% accuracy without the need of repetition of task instructions or significant delays in responding.    Time 12    Period Weeks    Status Partially Met    Target Date 09/12/20      SLP LONG TERM GOAL #5   Title Patient will demonstrate reading comprehension for paragraphs with 80% accuracy.    Time 12    Period Weeks    Status Partially Met    Target Date 09/19/20            Plan - 07/03/20 1342    Clinical Impression Statement Continue to target functional writing with skilled ST and written/ oral models for spelling. Incorporate additional math into processing tasks. Continue to target focus/ organization when listening and reading to improve understanding, specifically when given information heavy passages. Target reading instructions/ multisyllabic words aloud.    Speech Therapy Frequency 2x / week    Duration Other (comment)    Treatment/Interventions Patient/family education;Language facilitation    Potential to Achieve Goals Good    Potential Considerations Ability to learn/carryover information;Family/community support;Previous level of function;Cooperation/participation level    Consulted and Agree with Plan of Care Patient           Patient will benefit from skilled therapeutic intervention in order to improve the following deficits and impairments:   Aphasia    Problem List Patient Active Problem List   Diagnosis Date Noted  . Seizures ([place]) 05/21/2020  . Depression 01/16/2016  . Cephalalgia 06/03/2015  . Problems influencing health status 06/03/2015  . 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 Angiopathy 06/03/2015  . Completed stroke ([place]) 06/03/2015  . Hypercholesterolemia 06/03/2015   Johnathan Campbell, BS Graduate Clinician [name] 07/03/2020, 1:43 PM  [place] MAIN [facility] SERVICES [address] [place], [place], [address] Phone: [phone]   Fax:   [phone]   Name: Johnathan Campbell MRN: [id number] Date of Birth: [birth year]

## 2020-07-09 ENCOUNTER — Encounter: Payer: Self-pay | Admitting: Speech Pathology

## 2020-07-09 ENCOUNTER — Ambulatory Visit: Payer: PPO | Admitting: Speech Pathology

## 2020-07-09 ENCOUNTER — Other Ambulatory Visit: Payer: Self-pay

## 2020-07-09 DIAGNOSIS — R4701 Aphasia: Secondary | ICD-10-CM | POA: Diagnosis not present

## 2020-07-09 NOTE — Therapy (Signed)
[place] MAIN [facility] SERVICES [address] [place], [place], [address] Phone: [phone]   Fax:  [phone]  Speech Language Pathology Treatment  Patient Details  Name: Johnathan Campbell MRN: [id number] Date of Birth: Jan 03, 1961 Referring Provider (SLP): Dr. Manuella Ghazi   Encounter Date: 07/09/2020   End of Session - 07/09/20 1238    Visit Number 26    Number of Visits 65    Date for SLP Re-Evaluation 09/19/20    Authorization Type Medicare    Authorization Time Period 06/04/2020    Authorization - Visit Number 9    Authorization - Number of Visits 10    Progress Note Due on Visit 10    SLP Start Time 1055    SLP Stop Time  1155    SLP Time Calculation (min) 60 min    Activity Tolerance Patient tolerated treatment well           Past Medical History:  Diagnosis Date  . Acute intractable headache    unspecified headache type  . Allergy   . Aphasia S/P CVA   . Clot    blood clot  . Completed stroke ([place])   . Hypercholesterolemia     Past Surgical History:  Procedure Laterality Date  . HERNIA REPAIR  twice    There were no vitals filed for this visit.   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 Patient history is significant for aphasia and seizures. Patient received SLP services in 2017 and has returned because he says his speech is "still not where he wants it to be". Patient has been attending teletherapy sessions with the [facility] ([facility]) due to COVID-19, however, he is searching for individual therapy because he wants more intensive treatment.       Treatment Provided   Treatment provided  Cognitive-Linquistic      Cognitive-Linquistic Treatment   Treatment focused on Aphasia    Skilled Treatment READING COMPREHENSION: Patient completed one Perplexor puzzle with min-mod cues from clinician, mainly in the form of writing key words and giving verbal reminders.EXPRESSION: Patient completed 5 Semantic Feature Analysis charts with min cues to encourage elaboration verbally, and was able to write words with min verbal cues. Patient's overall cogency, when speaking and writing, improves when asked for clarification or clues about the topic he is discussing. Some phonemic paraphasias in speaking and writing.  AUDITORY COMPREHENSION: Patient answered 2/3 "when" questions about a short paragraph when the clinician reread the paragraph. Patient answered the 3rd easily when given the paragraph to read.       Assessment / Recommendations / Plan   Plan Continue with current plan of care      Progression Toward Goals   Progression toward goals Progressing toward goals            SLP Education - 07/09/20 1238    Education Details Strategies for problem solving, writing to help    Methods Explanation    Comprehension Verbalized understanding              SLP Long Term Goals - 06/26/20 1509  SLP LONG TERM GOAL #1   Title Patient will generate grammatical and cogent sentence to complete abstract/comple linguistic task with 80% accuracy.    Time 12    Period Weeks    Status Partially Met    Target Date 09/19/20      SLP LONG TERM GOAL #2   Title Patient will read aloud sentences and multi-syllabic words, maintaining phonemic accuracy, with 80% accuracy.      Time 12    Period Weeks    Status Partially Met    Target Date 09/19/20      SLP LONG TERM GOAL #3   Title Patient will write grammatical and cogent sentence to complete simple/basic linguistic task with 80% accuracy.    Time 12    Period Weeks    Status Partially Met    Target Date 09/12/20      SLP LONG TERM  GOAL #4   Title Patient will complete multi-unit processing tasks with 80% accuracy without the need of repetition of task instructions or significant delays in responding.    Time 12    Period Weeks    Status Partially Met    Target Date 09/12/20      SLP LONG TERM GOAL #5   Title Patient will demonstrate reading comprehension for paragraphs with 80% accuracy.    Time 12    Period Weeks    Status Partially Met    Target Date 09/19/20            Plan - 07/09/20 1239    Clinical Impression Statement Patient is improving with reading comprehension and Perplexors, as he's utilizing strategies (taking notes, highlighting, checking off clues) to aid in his own understanding and to prevent confusion when completing the task. One of patient's strengths is self correction/ self awareness in functional writing and conversation. Continue to target organization/ taking notes if needed when listening to improve understanding, specifically when given information heavy passages.    Speech Therapy Frequency 2x / week    Duration Other (comment)    Treatment/Interventions Patient/family education;Language facilitation    Potential to Achieve Goals Good    Potential Considerations Ability to learn/carryover information;Family/community support;Previous level of function;Cooperation/participation level    Consulted and Agree with Plan of Care Patient           Patient will benefit from skilled therapeutic intervention in order to improve the following deficits and impairments:   Aphasia    Problem List Patient Active Problem List   Diagnosis Date Noted  . Seizures ([place]) 05/21/2020  . Depression 01/16/2016  . Cephalalgia 06/03/2015  . Problems influencing health status 06/03/2015  . 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 Angiopathy 06/03/2015  . Completed stroke ([place]) 06/03/2015  . Hypercholesterolemia 06/03/2015   [name], BS Graduate  Clinician [name] 07/09/2020, 12:40 PM  [place] MAIN [facility] SERVICES [address] [place], [place], [address] Phone: [phone]   Fax:  [phone]   Name: Johnathan Campbell MRN: [id number] Date of Birth: 09-May-1961

## 2020-07-10 DIAGNOSIS — D2262 Melanocytic nevi of left upper limb, including shoulder: Secondary | ICD-10-CM | POA: Diagnosis not present

## 2020-07-10 DIAGNOSIS — D2271 Melanocytic nevi of right lower limb, including hip: Secondary | ICD-10-CM | POA: Diagnosis not present

## 2020-07-10 DIAGNOSIS — D225 Melanocytic nevi of trunk: Secondary | ICD-10-CM | POA: Diagnosis not present

## 2020-07-10 DIAGNOSIS — D2272 Melanocytic nevi of left lower limb, including hip: Secondary | ICD-10-CM | POA: Diagnosis not present

## 2020-07-10 DIAGNOSIS — L570 Actinic keratosis: Secondary | ICD-10-CM | POA: Diagnosis not present

## 2020-07-10 DIAGNOSIS — D2261 Melanocytic nevi of right upper limb, including shoulder: Secondary | ICD-10-CM | POA: Diagnosis not present

## 2020-07-10 DIAGNOSIS — X32XXXA Exposure to sunlight, initial encounter: Secondary | ICD-10-CM | POA: Diagnosis not present

## 2020-07-10 DIAGNOSIS — L821 Other seborrheic keratosis: Secondary | ICD-10-CM | POA: Diagnosis not present

## 2020-07-11 ENCOUNTER — Encounter: Payer: Self-pay | Admitting: Speech Pathology

## 2020-07-11 ENCOUNTER — Other Ambulatory Visit: Payer: Self-pay

## 2020-07-11 ENCOUNTER — Ambulatory Visit: Payer: PPO | Admitting: Speech Pathology

## 2020-07-11 DIAGNOSIS — R4701 Aphasia: Secondary | ICD-10-CM | POA: Diagnosis not present

## 2020-07-11 NOTE — Therapy (Signed)
Johnathan Campbell, Johnathan Campbell, Johnathan Campbell   Fax:  [phone]  Speech Language Pathology Treatment  Speech Therapy Progress Note   Dates of reporting period  06/04/2020   to   07/11/2020   Patient Details  Name: Johnathan Campbell MRN: [id number] Date of Birth: 1961-10-21 Referring Provider (SLP): Dr. Manuella Ghazi   Encounter Date: 07/11/2020   End of Session - 07/11/20 1259    Visit Number 57    Number of Visits 50    Date for SLP Re-Evaluation 09/19/20    Authorization Type Medicare    Authorization Time Period 06/04/2020    Authorization - Visit Number 10    Authorization - Number of Visits 10    Progress Note Due on Visit 10    SLP Start Time 1050    SLP Stop Time  1143    SLP Time Calculation (min) 53 min    Activity Tolerance Patient tolerated treatment well           Past Medical History:  Diagnosis Date  . Acute intractable headache    unspecified headache type  . Allergy   . Aphasia S/P CVA   . Clot    blood clot  . Completed stroke ([place])   . Hypercholesterolemia     Past Surgical History:  Procedure Laterality Date  . HERNIA REPAIR  twice    There were no vitals filed for this visit.   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 Patient history is significant for aphasia and seizures. Patient received SLP services in 2017 and has returned because he says his speech is "still not where he wants it to be". Patient has been attending teletherapy sessions with the [facility] ([facility]) due to COVID-19, however, he is searching for individual therapy because he wants  more intensive treatment.       Treatment Provided   Treatment provided Cognitive-Linquistic      Cognitive-Linquistic Treatment   Treatment focused on Aphasia    Skilled Treatment READING COMPREHENSION: Patient completed one Perplexor puzzle (Level A, 5 across) with mod cues from clinician, mainly in the form of giving tips for organization and verbal reminders to stay on track (ex. Sticking to one clue at a time, reading closely). EXPRESSION: Patient completed 3 Semantic Feature Analysis charts verbal and with writing with min cues to encourage elaboration. Patient's overall cogency, when speaking and writing, improves when asked for clarification or clues about the topic he is discussing. Experiences some moments of word finding difficulty when asked to provide additional/ more specific info      Assessment / Recommendations / [place] with current plan of care      Progression Toward Goals   Progression toward goals Progressing toward goals            SLP Education - 07/11/20 1258    Education Details problem solving, awareness of errors in writing    Person(s) Educated Patient    Methods Explanation    Comprehension Verbalized understanding;Returned demonstration  SLP Long Term Goals - 07/11/20 1415      SLP LONG TERM GOAL #1   Title Patient will generate grammatical and cogent sentence to complete abstract/comple linguistic task with 80% accuracy.    Status Partially Met    Target Date 09/12/20      SLP LONG TERM GOAL #2   Title Patient will read aloud sentences and multi-syllabic words, maintaining phonemic accuracy, with 80% accuracy.      Status Partially Met    Target Date 09/19/20      SLP LONG TERM GOAL #3   Title Patient will write grammatical and cogent sentence to complete simple/basic linguistic task with 80% accuracy.    Status Partially Met    Target Date 09/19/20      SLP LONG TERM GOAL #4   Title Patient will complete  multi-unit processing tasks with 80% accuracy without the need of repetition of task instructions or significant delays in responding.    Status Partially Met    Target Date 09/12/20      SLP LONG TERM GOAL #5   Title Patient will demonstrate reading comprehension for paragraphs with 80% accuracy.    Status Partially Met    Target Date 09/19/20            Plan - 07/11/20 1300    Clinical Impression Statement Patient is improving with reading comprehension and Perplexors, as he's utilizing strategies (taking notes, highlighting, checking off clues) to aid in his own understanding and to prevent confusion when completing the task. Patient recognizes when his writing is illegible. Continue to target auditory and reading comprehension with complex information (ex. Requiring inferencing) in the future.    Speech Therapy Frequency 2x / week    Duration Other (comment)    Treatment/Interventions Patient/family education;Language facilitation    Potential to Achieve Goals Good    Potential Considerations Ability to learn/carryover information;Family/community support;Previous level of function;Cooperation/participation level    Consulted and Agree with Plan of Care Patient           Patient will benefit from skilled therapeutic intervention in order to improve the following deficits and impairments:   Aphasia    Problem List Patient Active Problem List   Diagnosis Date Noted  . Seizures ([facility]) 05/21/2020  . Depression 01/16/2016  . Cephalalgia 06/03/2015  . Problems influencing health status 06/03/2015  . 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 Angiopathy 06/03/2015  . Completed stroke ([facility]) 06/03/2015  . Hypercholesterolemia 06/03/2015   Claire Roberts, BS Graduate Clinician Abernathy, Susie 07/11/2020, 2:17 PM  [facility] RE[facility] SERVICES [address] [place], [place], [address] Phone: [phone]   Fax:   [phone]   Name: Johnathan Campbell MRN: [id number] Date of Birth: 07/26/1961 

## 2020-07-16 ENCOUNTER — Encounter: Payer: Self-pay | Admitting: Speech Pathology

## 2020-07-16 ENCOUNTER — Ambulatory Visit: Payer: PPO | Admitting: Speech Pathology

## 2020-07-16 ENCOUNTER — Other Ambulatory Visit: Payer: Self-pay

## 2020-07-16 DIAGNOSIS — R4701 Aphasia: Secondary | ICD-10-CM | POA: Diagnosis not present

## 2020-07-16 NOTE — Therapy (Signed)
[place] MAIN [facility] SERVICES [address] [place], [place], [address] Phone: [phone]   Fax:  [phone]  Speech Language Pathology Treatment  Patient Details  Name: Johnathan Campbell MRN: [id number] Date of Birth: 1961/03/26 Referring Provider (SLP): Dr. Manuella Ghazi   Encounter Date: 07/16/2020   End of Session - 07/16/20 1209    Visit Number 60    Number of Visits 82    Date for SLP Re-Evaluation 09/19/20    Authorization Type Medicare    Authorization Time Period 07/16/2020    Authorization - Visit Number 1    Authorization - Number of Visits 10    Progress Note Due on Visit 10    SLP Start Time 1057    SLP Stop Time  1152    SLP Time Calculation (min) 55 min    Activity Tolerance Patient tolerated treatment well           Past Medical History:  Diagnosis Date  . Acute intractable headache    unspecified headache type  . Allergy   . Aphasia S/P CVA   . Clot    blood clot  . Completed stroke ([place])   . Hypercholesterolemia     Past Surgical History:  Procedure Laterality Date  . HERNIA REPAIR  twice    There were no vitals filed for this visit.   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 Patient history is significant for aphasia and seizures. Patient received SLP services in 2017 and has returned because he says his speech is "still not where he wants it to be". Patient has been attending teletherapy sessions with the [facility] ([facility]) due to COVID-19, however, he is searching for individual therapy because he wants more intensive treatment.       Treatment  Provided   Treatment provided Cognitive-Linquistic      Cognitive-Linquistic Treatment   Treatment focused on Aphasia    Skilled Treatment READING COMPREHENSION: Patient completed one Perplexor puzzle (Level B, 5 across) with mod cues from clinician, mainly in the form of giving tips for organization and verbal reminders to stay on track (ex. Sticking to one clue, confirming to cross or highlight). Patient was read a small paragraph and was prompted to answer "when", but required reading himself to answer accurately. EXPRESSION: Patient completed 2 Semantic Feature Analysis charts with min cues to encourage elaboration. Patient wrote one SFA paragraph utilizing his completed work from the chart, with sentences and phrases and some sentence modeling from the instructions and clinician.       Assessment / Recommendations / Plan   Plan Continue with current plan of care      Progression Toward Goals   Progression toward goals Progressing toward goals            SLP Education - 07/16/20 1208    Education Details increasing complexity of activities due to patient progress    Person(s) Educated Patient    Methods Explanation    Comprehension Verbalized understanding;Verbal cues required  SLP Long Term Goals - 07/11/20 1415      SLP LONG TERM GOAL #1   Title Patient will generate grammatical and cogent sentence to complete abstract/comple linguistic task with 80% accuracy.    Status Partially Met    Target Date 09/12/20      SLP LONG TERM GOAL #2   Title Patient will read aloud sentences and multi-syllabic words, maintaining phonemic accuracy, with 80% accuracy.      Status Partially Met    Target Date 09/19/20      SLP LONG TERM GOAL #3   Title Patient will write grammatical and cogent sentence to complete simple/basic linguistic task with 80% accuracy.    Status Partially Met    Target Date 09/19/20      SLP LONG TERM GOAL #4   Title Patient will complete  multi-unit processing tasks with 80% accuracy without the need of repetition of task instructions or significant delays in responding.    Status Partially Met    Target Date 09/12/20      SLP LONG TERM GOAL #5   Title Patient will demonstrate reading comprehension for paragraphs with 80% accuracy.    Status Partially Met    Target Date 09/19/20            Plan - 07/16/20 1211    Clinical Impression Statement Continue to target word finding/ writing with expanded Semantic Feature Analysis. Patient continues to increase in independence in all activities. Patient is improving in self awareness, esp in writing and can edit words if needed with some cueing. Although patient has difficulty comprehending auditory, he recalled additional information from the passage after the clinician finished reading. Patient's cogency in structured conversation continues to improve.    Speech Therapy Frequency 2x / week    Duration Other (comment)    Treatment/Interventions Patient/family education;Language facilitation    Potential to Achieve Goals Good    Potential Considerations Ability to learn/carryover information;Family/community support;Previous level of function;Cooperation/participation level    Consulted and Agree with Plan of Care Patient           Patient will benefit from skilled therapeutic intervention in order to improve the following deficits and impairments:   Aphasia    Problem List Patient Active Problem List   Diagnosis Date Noted  . Seizures ([place]) 05/21/2020  . Depression 01/16/2016  . Cephalalgia 06/03/2015  . Problems influencing health status 06/03/2015  . 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 Angiopathy 06/03/2015  . Completed stroke ([place]) 06/03/2015  . Hypercholesterolemia 06/03/2015   [name], BS Graduate Clinician Lou Miner 07/16/2020, 2:27 PM  [place] MAIN [facility] SERVICES [address] [place], [place], [address] Phone: [phone]   Fax:  [phone]   Name: Johnathan Campbell MRN: [id number] Date of Birth: [birth year]

## 2020-07-18 ENCOUNTER — Other Ambulatory Visit: Payer: Self-pay

## 2020-07-18 ENCOUNTER — Ambulatory Visit: Payer: PPO | Admitting: Speech Pathology

## 2020-07-18 ENCOUNTER — Encounter: Payer: Self-pay | Admitting: Speech Pathology

## 2020-07-18 DIAGNOSIS — R4701 Aphasia: Secondary | ICD-10-CM | POA: Diagnosis not present

## 2020-07-18 DIAGNOSIS — R41841 Cognitive communication deficit: Secondary | ICD-10-CM

## 2020-07-18 NOTE — Therapy (Signed)
[place] MAIN [facility] SERVICES [address] [place], [place], [address] Phone: [phone]   Fax:  [phone]  Speech Language Pathology Treatment  Patient Details  Name: Johnathan Campbell MRN: [id number] Date of Birth: 03-27-1961 Referring Provider (SLP): Dr. Manuella Ghazi   Encounter Date: 07/18/2020   End of Session - 07/18/20 1204    Visit Number 61    Number of Visits 68    Date for SLP Re-Evaluation 09/19/20    Authorization Type Medicare    Authorization Time Period 07/16/2020    Authorization - Visit Number 2    Authorization - Number of Visits 10    Progress Note Due on Visit 10    SLP Start Time 1100    SLP Stop Time  1150    SLP Time Calculation (min) 50 min    Activity Tolerance Patient tolerated treatment well           Past Medical History:  Diagnosis Date  . Acute intractable headache    unspecified headache type  . Allergy   . Aphasia S/P CVA   . Clot    blood clot  . Completed stroke ([place])   . Hypercholesterolemia     Past Surgical History:  Procedure Laterality Date  . HERNIA REPAIR  twice    There were no vitals filed for this visit.   Subjective Assessment - 07/18/20 1203    Subjective conversation increasing in cogency, "well we don't know yet"    Currently in Pain? 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 Patient history is significant for aphasia and seizures. Patient received SLP services in 2017 and has returned because he says his speech is "still not where he wants it to be". Patient has been attending teletherapy sessions with the [facility] (T[facility]) due to COVID-19, however, he is searching for individual therapy because he wants more  intensive treatment.       Treatment Provided   Treatment provided Cognitive-Linquistic      Cognitive-Linquistic Treatment   Treatment focused on Aphasia    Skilled Treatment READING COMPREHENSION: Patient completed one Perplexor puzzle (Level B, 5 across) with mod cues from clinician to reread clues/ support decisions. Patient utilized Char[employer] fundraiser to process information from clues. Patient read 3 short paragraphs and answered "who", and required min cues from clinician for specifics. EXPRESSION: Patient completed 2 Semantic Feature Analysis charts with min-mod cues to encourage elaboration and specificity. Patient wrote one SFA paragraph utilizing his completed work from the chart, with sentences and phrases and some sentence modeling from the instructions and clinician.        Assessment / Recommendations / Plan   Plan Continue with current plan of care      Progression Toward Goals   Progression toward goals Progressing toward goals            SLP Education - 07/18/20 1204    Education Details increasing complexity of activities due to patient progress, use of text to speech app    Person(s) Educated Patient    Methods Explanation    Comprehension Verbalized understanding;Returned demonstration  SLP Long Term Goals - 07/11/20 1415      SLP LONG TERM GOAL #1   Title Patient will generate grammatical and cogent sentence to complete abstract/comple linguistic task with 80% accuracy.    Status Partially Met    Target Date 09/12/20      SLP LONG TERM GOAL #2   Title Patient will read aloud sentences and multi-syllabic words, maintaining phonemic accuracy, with 80% accuracy.      Status Partially Met    Target Date 09/19/20      SLP LONG TERM GOAL #3   Title Patient will write grammatical and cogent sentence to complete simple/basic linguistic task with 80% accuracy.    Status Partially Met    Target Date 09/19/20      SLP LONG TERM GOAL #4   Title  Patient will complete multi-unit processing tasks with 80% accuracy without the need of repetition of task instructions or significant delays in responding.    Status Partially Met    Target Date 09/12/20      SLP LONG TERM GOAL #5   Title Patient will demonstrate reading comprehension for paragraphs with 80% accuracy.    Status Partially Met    Target Date 09/19/20            Plan - 07/18/20 1205    Clinical Impression Statement Continue to target word finding/ writing/ organization with expanded Semantic Feature Analysis. Patient continues to increase in independence in all activities. Patient recognizes misspelled words and can self correct or rewrite with a model. Patient's cogency in structured conversation continues to improve.    Speech Therapy Frequency 2x / week    Duration Other (comment)    Treatment/Interventions Patient/family education;Language facilitation    Potential to Achieve Goals Good    Potential Considerations Ability to learn/carryover information;Family/community support;Previous level of function;Cooperation/participation level    Consulted and Agree with Plan of Care Patient           Patient will benefit from skilled therapeutic intervention in order to improve the following deficits and impairments:   Cognitive communication deficit    Problem List Patient Active Problem List   Diagnosis Date Noted  . Seizures ([place]) 05/21/2020  . Depression 01/16/2016  . Cephalalgia 06/03/2015  . Problems influencing health status 06/03/2015  . 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 Angiopathy 06/03/2015  . Completed stroke ([place]) 06/03/2015  . Hypercholesterolemia 06/03/2015   [name], BS Graduate Clinician [name] 07/18/2020, 12:05 PM  [place] MAIN [facility] SERVICES [address] [place], [place], [address] Phone: [phone]   Fax:  [phone]   Name: BENITO LEMMERMAN MRN: [id number] Date of Birth: [birth year]

## 2020-07-23 ENCOUNTER — Encounter: Payer: Self-pay | Admitting: Speech Pathology

## 2020-07-23 ENCOUNTER — Ambulatory Visit: Payer: PPO | Admitting: Speech Pathology

## 2020-07-23 ENCOUNTER — Other Ambulatory Visit: Payer: Self-pay

## 2020-07-23 DIAGNOSIS — R4701 Aphasia: Secondary | ICD-10-CM

## 2020-07-23 NOTE — Therapy (Signed)
[place] MAIN [facility] SERVICES [address] [place], [place], [address] Phone: [phone]   Fax:  [phone]  Speech Language Pathology Treatment  Patient Details  Name: Johnathan Campbell MRN: [id number] Date of Birth: [birth year] Referring Provider (SLP): Dr. Manuella Ghazi   Encounter Date: 07/23/2020   End of Session - 07/23/20 1706    Visit Number 80    Number of Visits 54    Date for SLP Re-Evaluation 09/19/20    Authorization Type Medicare    Authorization Time Period 07/16/2020    Authorization - Visit Number 3    Authorization - Number of Visits 10    Progress Note Due on Visit 10    SLP Start Time 1300    SLP Stop Time  6759    SLP Time Calculation (min) 55 min    Activity Tolerance Patient tolerated treatment well           Past Medical History:  Diagnosis Date  . Acute intractable headache    unspecified headache type  . Allergy   . Aphasia S/P CVA   . Clot    blood clot  . Completed stroke ([place])   . Hypercholesterolemia     Past Surgical History:  Procedure Laterality Date  . HERNIA REPAIR  twice    There were no vitals filed for this visit.   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 Patient history is significant for aphasia and seizures. Patient received SLP services in 2017 and has returned because he says his speech is "still not where he wants it to be". Patient has been attending teletherapy sessions with the [facility] (T[facility]) due to COVID-19, however, he is searching for individual therapy because he wants more intensive treatment.       Treatment Provided   Treatment  provided Cognitive-Linquistic      Cognitive-Linquistic Treatment   Treatment focused on Aphasia    Skilled Treatment READING COMPREHENSION: Patient completed one Perplexor puzzle (Level B, 5 across) with mod cues from clinician to reread clues/ review his voices. Patient had difficulty supporting his reasoning and understanding the difference between names. Patient read 3 short paragraphs and answered "who/ when/ where" with 75% acc independently and 100% given min cues to reread. EXPRESSION: Patient completed 1 Semantic Feature Analysis charts with min cues to encourage elaboration and specificity. Patient wrote one SFA paragraph utilizing his completed work from the chart, with phrases and some sentence modeling from the instructions.       Assessment / Recommendations / Plan   Plan Continue with current plan of care      Progression Toward Goals   Progression toward goals Progressing toward goals            SLP Education - 07/23/20 1706    Education Details Increasing difficulty due to progress, reminder of problem solving strategies    Person(s) Educated Patient    Methods Explanation    Comprehension Verbalized understanding;Returned demonstration              SLP Long Term Goals - 07/11/20 1415  SLP LONG TERM GOAL #1   Title Patient will generate grammatical and cogent sentence to complete abstract/comple linguistic task with 80% accuracy.    Status Partially Met    Target Date 09/12/20      SLP LONG TERM GOAL #2   Title Patient will read aloud sentences and multi-syllabic words, maintaining phonemic accuracy, with 80% accuracy.      Status Partially Met    Target Date 09/19/20      SLP LONG TERM GOAL #3   Title Patient will write grammatical and cogent sentence to complete simple/basic linguistic task with 80% accuracy.    Status Partially Met    Target Date 09/19/20      SLP LONG TERM GOAL #4   Title Patient will complete multi-unit processing tasks with 80%  accuracy without the need of repetition of task instructions or significant delays in responding.    Status Partially Met    Target Date 09/12/20      SLP LONG TERM GOAL #5   Title Patient will demonstrate reading comprehension for paragraphs with 80% accuracy.    Status Partially Met    Target Date 09/19/20            Plan - 07/23/20 1707    Clinical Impression Statement Remind patient of helpful visual/ problem solving strategies. Patient's cogency, reading skills, and overall independence continues to improve. Continue to target word finding/ writing/ organization with expanded [employer], expanding on phrases.    Speech Therapy Frequency 2x / week    Duration Other (comment)    Treatment/Interventions Patient/family education;Language facilitation    Potential to Achieve Goals Good    Potential Considerations Ability to learn/carryover information;Family/community support;Previous level of function;Cooperation/participation level    Consulted and Agree with Plan of Care Patient           Patient will benefit from skilled therapeutic intervention in order to improve the following deficits and impairments:   Aphasia    Problem List Patient Active Problem List   Diagnosis Date Noted  . Seizures ([place]) 05/21/2020  . Depression 01/16/2016  . Cephalalgia 06/03/2015  . Problems influencing health status 06/03/2015  . 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 Angiopathy 06/03/2015  . Completed stroke ([place]) 06/03/2015  . Hypercholesterolemia 06/03/2015   [name], BS Graduate Clinician [name] 07/23/2020, 5:07 PM  [place] MAIN [facility] SERVICES [address] [place], [place], [address] Phone: [phone]   Fax:  [phone]   Name: Johnathan Campbell MRN: [id number] Date of Birth: 08/15/1961

## 2020-07-25 ENCOUNTER — Ambulatory Visit: Payer: PPO | Admitting: Speech Pathology

## 2020-07-29 DIAGNOSIS — E782 Mixed hyperlipidemia: Secondary | ICD-10-CM | POA: Diagnosis not present

## 2020-07-29 DIAGNOSIS — I10 Essential (primary) hypertension: Secondary | ICD-10-CM | POA: Diagnosis not present

## 2020-07-29 DIAGNOSIS — I739 Peripheral vascular disease, unspecified: Secondary | ICD-10-CM | POA: Diagnosis not present

## 2020-07-29 DIAGNOSIS — I639 Cerebral infarction, unspecified: Secondary | ICD-10-CM | POA: Diagnosis not present

## 2020-07-30 ENCOUNTER — Encounter: Payer: Self-pay | Admitting: Speech Pathology

## 2020-07-30 ENCOUNTER — Ambulatory Visit: Payer: PPO | Admitting: Speech Pathology

## 2020-07-30 ENCOUNTER — Other Ambulatory Visit: Payer: Self-pay

## 2020-07-30 DIAGNOSIS — R4701 Aphasia: Secondary | ICD-10-CM | POA: Diagnosis not present

## 2020-07-30 NOTE — Therapy (Signed)
[place] MAIN [facility] SERVICES [address] [place], [place], [address] Phone: [phone]   Fax:  [phone]  Speech Language Pathology Treatment  Patient Details  Name: Johnathan Campbell MRN: [id number] Date of Birth: 14-Oct-1961 Referring Provider (SLP): Dr. Manuella Ghazi   Encounter Date: 07/30/2020   End of Session - 07/30/20 1227    Visit Number 43    Number of Visits 108    Authorization Type Medicare    Authorization Time Period 07/16/2020    Authorization - Visit Number 4    Authorization - Number of Visits 10    Progress Note Due on Visit 10    SLP Start Time 1100    SLP Stop Time  1155    SLP Time Calculation (min) 55 min    Activity Tolerance Patient tolerated treatment well           Past Medical History:  Diagnosis Date  . Acute intractable headache    unspecified headache type  . Allergy   . Aphasia S/P CVA   . Clot    blood clot  . Completed stroke ([place])   . Hypercholesterolemia     Past Surgical History:  Procedure Laterality Date  . HERNIA REPAIR  twice    There were no vitals filed for this visit.   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 Patient history is significant for aphasia and seizures. Patient received SLP services in 2017 and has returned because he says his speech is "still not where he wants it to be". Patient has been attending teletherapy sessions with the [facility] (T[facility]) due to COVID-19, however, he is searching for individual therapy because he wants more intensive treatment.       Treatment Provided   Treatment provided  Cognitive-Linquistic      Cognitive-Linquistic Treatment   Treatment focused on Aphasia    Skilled Treatment READING COMPREHENSION: Patient completed one Perplexor puzzle (Level B, 5 across) with mod cues from clinician to reread clues and ensure his choices are correct. Patient had difficulty remaining focused on one clue at a time. Patient read 3 simple and 1 moderately complex paragraph, with 90% acc independently with increasing accuracy given min verbal cues to elaborate or reread questions. Patient provided his yes/ no answers with solid reasoning. EXPRESSION: Patient completed 2 Semantic Feature Analysis charts with min cues to encourage elaboration and specificity. Patient wrote one SFA paragraph utilizing his completed work from the chart, with phrases and some sentence modeling from the clinician.       Assessment / Recommendations / Plan   Plan Continue with current plan of care      Progression Toward Goals   Progression toward goals Progressing toward goals            SLP Education - 07/30/20 1226    Education Details auditory vs. reading comprehension, reminder of problem solving strategies    Person(s) Educated Patient    Methods Explanation    Comprehension Verbalized understanding  SLP Long Term Goals - 07/11/20 1415      SLP LONG TERM GOAL #1   Title Patient will generate grammatical and cogent sentence to complete abstract/comple linguistic task with 80% accuracy.    Status Partially Met    Target Date 09/12/20      SLP LONG TERM GOAL #2   Title Patient will read aloud sentences and multi-syllabic words, maintaining phonemic accuracy, with 80% accuracy.      Status Partially Met    Target Date 09/19/20      SLP LONG TERM GOAL #3   Title Patient will write grammatical and cogent sentence to complete simple/basic linguistic task with 80% accuracy.    Status Partially Met    Target Date 09/19/20      SLP LONG TERM GOAL #4   Title Patient will  complete multi-unit processing tasks with 80% accuracy without the need of repetition of task instructions or significant delays in responding.    Status Partially Met    Target Date 09/12/20      SLP LONG TERM GOAL #5   Title Patient will demonstrate reading comprehension for paragraphs with 80% accuracy.    Status Partially Met    Target Date 09/19/20            Plan - 07/30/20 1227    Clinical Impression Statement Remind patient of helpful problem solving strategies, like writing out notes or checking off clues when completed. Overall cogency during structured activities and in conversation continues to improve. Incorporate auditory comprehension with reading comprehension. Continue to encourage proper grammar and complete sentences with SFA, targeting word finding and organization.    Speech Therapy Frequency 2x / week    Duration Other (comment)    Treatment/Interventions Patient/family education;Language facilitation    Potential to Achieve Goals Good    Potential Considerations Ability to learn/carryover information;Family/community support;Previous level of function;Cooperation/participation level    Consulted and Agree with Plan of Care Patient           Patient will benefit from skilled therapeutic intervention in order to improve the following deficits and impairments:   Aphasia    Problem List Patient Active Problem List   Diagnosis Date Noted  . Seizures ([place]) 05/21/2020  . Depression 01/16/2016  . Cephalalgia 06/03/2015  . Problems influencing health status 06/03/2015  . 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 Angiopathy 06/03/2015  . Completed stroke ([place]) 06/03/2015  . Hypercholesterolemia 06/03/2015   [name], BS Graduate Clinician [name] 07/30/2020, 12:27 PM  [place] MAIN [facility] SERVICES [address] [place], [place], [address] Phone: [phone]   Fax:   [phone]   Name: Johnathan Campbell MRN: [id number] Date of Birth: 12-29-1960

## 2020-08-01 ENCOUNTER — Ambulatory Visit: Payer: PPO | Admitting: Speech Pathology

## 2020-08-01 ENCOUNTER — Encounter: Payer: Self-pay | Admitting: Speech Pathology

## 2020-08-01 ENCOUNTER — Other Ambulatory Visit: Payer: Self-pay

## 2020-08-01 DIAGNOSIS — R4701 Aphasia: Secondary | ICD-10-CM | POA: Diagnosis not present

## 2020-08-01 NOTE — Therapy (Signed)
[place] MAIN [facility] SERVICES [address] [place], [place], [address] Phone: [phone]   Fax:  [phone]  Speech Language Pathology Treatment  Patient Details  Name: Johnathan Campbell MRN: [id number] Date of Birth: [birth year] Referring Provider (SLP): Dr. Manuella Ghazi   Encounter Date: 08/01/2020   End of Session - 08/01/20 1221    Visit Number 70    Number of Visits 22    Date for SLP Re-Evaluation 09/19/20    Authorization Type Medicare    Authorization Time Period 07/16/2020    Authorization - Visit Number 5    Authorization - Number of Visits 10    Progress Note Due on Visit 10    SLP Start Time 1100    SLP Stop Time  1150    SLP Time Calculation (min) 50 min    Activity Tolerance Patient tolerated treatment well           Past Medical History:  Diagnosis Date   Acute intractable headache    unspecified headache type   Allergy    Aphasia S/P CVA    Clot    blood clot   Completed stroke ([place])    Hypercholesterolemia     Past Surgical History:  Procedure Laterality Date   HERNIA REPAIR  twice    There were no vitals filed for this visit.   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 Patient history is significant for aphasia and seizures. Patient received SLP services in 2017 and has returned because he says his speech is "still not where he wants it to be". Patient has been attending teletherapy sessions with the [facility] ([facility]) due to COVID-19, however, he is searching for individual therapy because he wants more intensive treatment.        Treatment Provided   Treatment provided Cognitive-Linquistic      Cognitive-Linquistic Treatment   Treatment focused on Aphasia    Skilled Treatment READING COMPREHENSION: Patient completed one Perplexor puzzle (Level B, 5 across) with min-mod cues from clinician to utilize problem solving strategies like crossing off clues or items to increase accuracy and decrease confusion. Patient read 5 short paragraphs, answering "who, when, where" questions read by the clinician with 85% acc given min cues or rewording of the question. Patient provided additional information about the paragraph when needed. Accuracy improved when provided with "name" for "who" and "time" for "when". EXPRESSION: Patient completed 2 Semantic Feature Analysis charts independently, and elaborated given min cues. Patient wrote one SFA paragraph utilizing his handwritten information from the chart, with cogency and min verbal phrase models from clinician to begin sentences.       Assessment / Recommendations / Plan   Plan Continue with current plan of care      Progression Toward Goals   Progression toward goals Progressing toward goals            SLP Education - 08/01/20 1221    Education Details Problem solving/ self cueing strategies    Methods Explanation    Comprehension Verbalized understanding;Verbal cues required  SLP Long Term Goals - 07/11/20 1415      SLP LONG TERM GOAL #1   Title Patient will generate grammatical and cogent sentence to complete abstract/comple linguistic task with 80% accuracy.    Status Partially Met    Target Date 09/12/20      SLP LONG TERM GOAL #2   Title Patient will read aloud sentences and multi-syllabic words, maintaining phonemic accuracy, with 80% accuracy.      Status Partially Met    Target Date 09/19/20      SLP LONG TERM GOAL #3   Title Patient will write grammatical and cogent sentence to complete simple/basic linguistic task with 80% accuracy.     Status Partially Met    Target Date 09/19/20      SLP LONG TERM GOAL #4   Title Patient will complete multi-unit processing tasks with 80% accuracy without the need of repetition of task instructions or significant delays in responding.    Status Partially Met    Target Date 09/12/20      SLP LONG TERM GOAL #5   Title Patient will demonstrate reading comprehension for paragraphs with 80% accuracy.    Status Partially Met    Target Date 09/19/20            Plan - 08/01/20 1222    Clinical Impression Statement Continue to remind patient of strategies to improve accuracy with logic puzzles to increase accuracy. Cogency in both conversation and writing continues to improve. Incorporate increased auditory comprehension activities, as patient noted that this is a weakness of his. Encourage proper grammar and spelling with SFA, providing organization strategies if needed. Encourage detail and personal associations, in particular, with SFA to expand on expression.    Speech Therapy Frequency 2x / week    Duration Other (comment)    Treatment/Interventions Patient/family education;Language facilitation    Potential to Achieve Goals Good    Potential Considerations Ability to learn/carryover information;Family/community support;Previous level of function;Cooperation/participation level    Consulted and Agree with Plan of Care Patient           Patient will benefit from skilled therapeutic intervention in order to improve the following deficits and impairments:   Aphasia    Problem List Patient Active Problem List   Diagnosis Date Noted   Seizures ([place]) 05/21/2020   Depression 01/16/2016   Cephalalgia 06/03/2015   Problems influencing health status 06/03/2015   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 [place], [place], [address] Phone: [phone]   Fax:  [phone]   Name: Johnathan Campbell MRN: [id number] Date of Birth: [birth year]

## 2020-08-06 ENCOUNTER — Ambulatory Visit: Payer: PPO | Attending: Neurology | Admitting: Speech Pathology

## 2020-08-06 ENCOUNTER — Other Ambulatory Visit: Payer: Self-pay

## 2020-08-06 ENCOUNTER — Encounter: Payer: Self-pay | Admitting: Speech Pathology

## 2020-08-06 DIAGNOSIS — I69920 Aphasia following unspecified cerebrovascular disease: Secondary | ICD-10-CM | POA: Insufficient documentation

## 2020-08-06 DIAGNOSIS — R4701 Aphasia: Secondary | ICD-10-CM | POA: Diagnosis not present

## 2020-08-06 NOTE — Therapy (Signed)
[place] MAIN [facility] SERVICES [address] [place], [place], [address] Phone: [phone]   Fax:  [phone]  Speech Language Pathology Treatment  Patient Details  Name: Johnathan Campbell MRN: [id number] Date of Birth: 16-Apr-1961 Referring Provider (SLP): Dr. Manuella Ghazi   Encounter Date: 08/06/2020   End of Session - 08/06/20 1225    Visit Number 65    Number of Visits 65    Date for SLP Re-Evaluation 09/19/20    Authorization Type Medicare    Authorization Time Period 07/16/2020    Authorization - Visit Number 5    Authorization - Number of Visits 10    Progress Note Due on Visit 10    SLP Start Time 1100    SLP Stop Time  1145    SLP Time Calculation (min) 45 min    Activity Tolerance Patient tolerated treatment well           Past Medical History:  Diagnosis Date  . Acute intractable headache    unspecified headache type  . Allergy   . Aphasia S/P CVA   . Clot    blood clot  . Completed stroke ([place])   . Hypercholesterolemia     Past Surgical History:  Procedure Laterality Date  . HERNIA REPAIR  twice    There were no vitals filed for this visit.   Subjective Assessment - 08/06/20 1223    Subjective Pt was pleasant and cooperative with unfamiliar therapist    Currently in Pain? No/denies                 ADULT SLP TREATMENT - 08/06/20 0001      General Information   Behavior/Cognition Alert;Cooperative;Pleasant mood    HPI Patient is a 59 year old male who suffered an ischemic left MCA infarct with involvement of both temporal and parietal lobes on 05/15/2013. Patient history is significant for aphasia and seizures. Patient received SLP services in 2017 and has returned because he says his speech is "still not where he wants it to be". Patient has been attending teletherapy sessions with the [facility] (TAP) due to COVID-19, however, he is searching for individual therapy because he wants more intensive  treatment.       Treatment Provided   Treatment provided Cognitive-Linquistic      Cognitive-Linquistic Treatment   Treatment focused on Aphasia    Skilled Treatment READING COMPREHENSION: Patient completed one Perplexor puzzle (Level B, p46) with min cues from SLP to attend to details in clues.   VERBAL/WRITTEN EXPRESSION: Pt completed semantic feature analysis (SFA) chart with min-mod cues for specific word finding when describing the item. Self correction of spelling/grammatical errors noted.         Assessment / Recommendations / Plan   Plan Continue with current plan of care      Progression Toward Goals   Progression toward goals Progressing toward goals            SLP Education - 08/06/20 1224    Education Details Pt was receptive to education regarding decreasing rate of speech to improve clarity of speech    Person(s) Educated Patient    Methods Explanation;Demonstration    Comprehension Verbalized understanding;Verbal cues required              SLP Long Term Goals - 07/11/20 1415      SLP LONG TERM GOAL #1   Title Patient will generate grammatical and cogent sentence to complete abstract/comple linguistic task with  80% accuracy.    Status Partially Met    Target Date 09/12/20      SLP LONG TERM GOAL #2   Title Patient will read aloud sentences and multi-syllabic words, maintaining phonemic accuracy, with 80% accuracy.      Status Partially Met    Target Date 09/19/20      SLP LONG TERM GOAL #3   Title Patient will write grammatical and cogent sentence to complete simple/basic linguistic task with 80% accuracy.    Status Partially Met    Target Date 09/19/20      SLP LONG TERM GOAL #4   Title Patient will complete multi-unit processing tasks with 80% accuracy without the need of repetition of task instructions or significant delays in responding.    Status Partially Met    Target Date 09/12/20      SLP LONG TERM GOAL #5   Title Patient will  demonstrate reading comprehension for paragraphs with 80% accuracy.    Status Partially Met    Target Date 09/19/20            Plan - 08/06/20 1225    Clinical Impression Statement Pt is motivated during therapy even during tasks he finds "boring". He reports effective verbal communication is the most important thing to him. Continued ST intervntion per plan of care is recommended.    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 Seizures ([place]) 05/21/2020  . Depression 01/16/2016  . Cephalalgia 06/03/2015  . Problems influencing health status 06/03/2015  . 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 Angiopathy 06/03/2015  . Completed stroke ([place]) 06/03/2015  . Hypercholesterolemia 06/03/2015   Saryn Cherry B. Quentin Ore [facility], CCC-SLP Speech Language Pathologist  Shonna Chock 08/06/2020, 12:29 PM  [place] MAIN [facility] SERVICES [address] [place], [place], [address] Phone: [phone]   Fax:  [phone]   Name: AVNER STRODER MRN: [id number] Date of Birth: 1961-06-24

## 2020-08-08 ENCOUNTER — Encounter: Payer: Self-pay | Admitting: Speech Pathology

## 2020-08-08 ENCOUNTER — Ambulatory Visit: Payer: PPO | Admitting: Speech Pathology

## 2020-08-08 ENCOUNTER — Other Ambulatory Visit: Payer: Self-pay

## 2020-08-08 DIAGNOSIS — I69920 Aphasia following unspecified cerebrovascular disease: Secondary | ICD-10-CM | POA: Diagnosis not present

## 2020-08-08 DIAGNOSIS — R4701 Aphasia: Secondary | ICD-10-CM
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
## 2020-08-13 ENCOUNTER — Ambulatory Visit: Payer: PPO | Admitting: Speech Pathology

## 2020-08-13 ENCOUNTER — Other Ambulatory Visit: Payer: Self-pay

## 2020-08-13 ENCOUNTER — Encounter: Payer: Self-pay | Admitting: Speech Pathology

## 2020-08-13 DIAGNOSIS — R4701 Aphasia: Secondary | ICD-10-CM

## 2020-08-13 DIAGNOSIS — I69920 Aphasia following unspecified cerebrovascular disease: Secondary | ICD-10-CM | POA: Diagnosis not present

## 2020-08-13 NOTE — Therapy (Signed)
[place] [facility] SERVICES [address] [place], [place], [address] Phone: [phone]   Fax:  [phone]  Speech Language Pathology Treatment  Patient Details  Name: Johnathan Campbell MRN: [id number] Date of Birth: [birth year] Referring Provider (SLP): Dr. Manuella Ghazi   Encounter Date: 08/13/2020   End of Session - 08/13/20 1208    Visit Number 67    Number of Visits 7    Date for SLP Re-Evaluation 09/19/20    Authorization Type Medicare    Authorization Time Period 07/16/2020    Authorization - Visit Number 6    Authorization - Number of Visits 10    Progress Note Due on Visit 10    SLP Start Time 1100    SLP Stop Time  1150    SLP Time Calculation (min) 50 min    Activity Tolerance Patient tolerated treatment well           Past Medical History:  Diagnosis Date  . Acute intractable headache    unspecified headache type  . Allergy   . Aphasia S/P CVA   . Clot    blood clot  . Completed stroke ([place])   . Hypercholesterolemia     Past Surgical History:  Procedure Laterality Date  . HERNIA REPAIR  twice    There were no vitals filed for this visit.   Subjective Assessment - 08/13/20 1207    Subjective "hey stranger!", patient remained focused despite challenge w comprehension and focus                 ADULT SLP TREATMENT - 08/13/20 0001      General Information   Behavior/Cognition Alert;Cooperative;Pleasant mood    HPI Patient is a 59 year old male who suffered an ischemic left MCA infarct with involvement of both temporal and parietal lobes on 05/15/2013. Patient history is significant for aphasia and seizures. Patient received SLP services in 2017 and has returned because he says his speech is "still not where he wants it to be". Patient has been attending teletherapy sessions with the [facility] (TAP) due to COVID-19, however, he is searching for individual therapy because he wants more intensive  treatment.       Treatment Provided   Treatment provided Cognitive-Linquistic      Cognitive-Linquistic Treatment   Treatment focused on Aphasia    Skilled Treatment COMPREHENSION: Pt completed 1 Perplexor puzzle (Level B, 5 across 4 down) with mod cues from clinician. Clinician provided cues to remain on one piece of information, and prompted pt to read clues fully. Additional difficulty due to similar words "shoe" and "sock" within puzzle.EXPRESSION: Pt completed one Semantic Feature Analysis outline with min cues to elaborate from clinician. Pt wrote complete sentences, with some lapses in cogency due to spelling/ grammar, using the information from the SFA format. Pt provided additional written information not featured in the initial format. Patient provided information on the movie "Aphasia" with no lapse in cogency, clinician min cues to expand output.       Assessment / Recommendations / Plan   Plan Continue with current plan of care      Progression Toward Goals   Progression toward goals Progressing toward goals            SLP Education - 08/13/20 1207    Education Details utilization of strategies    Person(s) Educated Patient    Methods Explanation    Comprehension Verbalized understanding;Verbal cues required  SLP Long Term Goals - 07/11/20 1415      SLP LONG TERM GOAL #1   Title Patient will generate grammatical and cogent sentence to complete abstract/comple linguistic task with 80% accuracy.    Status Partially Met    Target Date 09/12/20      SLP LONG TERM GOAL #2   Title Patient will read aloud sentences and multi-syllabic words, maintaining phonemic accuracy, with 80% accuracy.      Status Partially Met    Target Date 09/19/20      SLP LONG TERM GOAL #3   Title Patient will write grammatical and cogent sentence to complete simple/basic linguistic task with 80% accuracy.    Status Partially Met    Target Date 09/19/20      SLP LONG TERM  GOAL #4   Title Patient will complete multi-unit processing tasks with 80% accuracy without the need of repetition of task instructions or significant delays in responding.    Status Partially Met    Target Date 09/12/20      SLP LONG TERM GOAL #5   Title Patient will demonstrate reading comprehension for paragraphs with 80% accuracy.    Status Partially Met    Target Date 09/19/20            Plan - 08/13/20 1208    Clinical Impression Statement Pt experienced difficulty remaining on one piece of information at a time within the Perplexor, and did not use his strategies (crossing out, checking off) as effectively as he had in the past. Simplify Perplexors as needed. Continue to remind pt of these strategies. Target verbal expression, reading writing and conversation, to increase cogency. Target auditory comprehension from paragraphs and questions from clinician.    Speech Therapy Frequency 2x / week    Duration Other (comment)    Treatment/Interventions Patient/family education;Language facilitation;Functional tasks    Potential to Achieve Goals Good    Potential Considerations Ability to learn/carryover information;Family/community support;Previous level of function;Cooperation/participation level    Consulted and Agree with Plan of Care Patient           Patient will benefit from skilled therapeutic intervention in order to improve the following deficits and impairments:   Aphasia    Problem List Patient Active Problem List   Diagnosis Date Noted  . Seizures ([place]) 05/21/2020  . Depression 01/16/2016  . Cephalalgia 06/03/2015  . Problems influencing health status 06/03/2015  . 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 Angiopathy 06/03/2015  . Completed stroke ([place]) 06/03/2015  . Hypercholesterolemia 06/03/2015   Johnathan Campbell, BS Graduate Clinician Johnathan Campbell 08/13/2020, 12:09 PM  Johnathan Campbell [facility]  SERVICES [address] [place], [place], [address] Phone: [phone]   Fax:  [phone]   Name: Johnathan Campbell MRN: [id number] Date of Birth: [birth year]

## 2020-08-15 ENCOUNTER — Ambulatory Visit: Payer: PPO | Admitting: Speech Pathology

## 2020-08-20 ENCOUNTER — Encounter: Payer: Self-pay | Admitting: Speech Pathology

## 2020-08-20 ENCOUNTER — Other Ambulatory Visit: Payer: Self-pay

## 2020-08-20 ENCOUNTER — Ambulatory Visit: Payer: PPO | Admitting: Speech Pathology

## 2020-08-20 DIAGNOSIS — R4701 Aphasia: Secondary | ICD-10-CM

## 2020-08-20 DIAGNOSIS — I69920 Aphasia following unspecified cerebrovascular disease: Secondary | ICD-10-CM | POA: Diagnosis not present

## 2020-08-20 NOTE — Therapy (Signed)
[place] MAIN [facility] SERVICES [address] [place], [place], [address] Phone: [phone]   Fax:  [phone]  Speech Language Pathology Treatment  Patient Details  Name: Johnathan Campbell MRN: [id number] Date of Birth: 05/05/1961 Referring Provider (SLP): Dr. Manuella Ghazi   Encounter Date: 08/20/2020   End of Session - 08/20/20 1215    Visit Number 44    Number of Visits 73    Date for SLP Re-Evaluation 09/19/20    Authorization Type Medicare    Authorization Time Period 07/16/2020    Authorization - Visit Number 7    Authorization - Number of Visits 10    Progress Note Due on Visit 10    SLP Start Time 1050    SLP Stop Time  1140    SLP Time Calculation (min) 50 min    Activity Tolerance Patient tolerated treatment well           Past Medical History:  Diagnosis Date  . Acute intractable headache    unspecified headache type  . Allergy   . Aphasia S/P CVA   . Clot    blood clot  . Completed stroke ([place])   . Hypercholesterolemia     Past Surgical History:  Procedure Laterality Date  . HERNIA REPAIR  twice    There were no vitals filed for this visit.   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 Patient history is significant for aphasia and seizures. Patient received SLP services in 2017 and has returned because he says his speech is "still not where he wants it to be". Patient has been attending teletherapy sessions with the [facility] (T[facility]) due to COVID-19, however, he is searching for individual therapy because he wants more intensive treatment.       Treatment  Provided   Treatment provided Cognitive-Linquistic      Cognitive-Linquistic Treatment   Treatment focused on Aphasia    Skilled Treatment AUDITORY COMPREHENSION: PT completed variety of auditory discrimination activities with mod cues from clinician and varying levels of difficulty. PT identified and wrote words that start with 's' when given images. 97% acc identifying target given minimal pairs and oral presentation from clinician. Difficulty identifying which words from group of 3 shared initial and ending sounds without clinician writing. Identified initial sounds of words, requiring min-mod cues from clinician in the form of repeating or writing the word. EXPRESSION: Pt completed one Semantic Feature Analysis outline with min cues to elaborate from clinician. Pt wrote phrases using information from SFA format.       Assessment / Recommendations / Plan   Plan Continue with current plan of care      Progression Toward Goals   Progression toward goals Progressing toward goals            SLP Education - 08/20/20 1215    Education Details self correction, focus    Person(s) Educated Patient    Methods Explanation    Comprehension Verbalized understanding;Verbal cues required              SLP Long Term  Goals - 07/11/20 1415      SLP LONG TERM GOAL #1   Title Patient will generate grammatical and cogent sentence to complete abstract/comple linguistic task with 80% accuracy.    Status Partially Met    Target Date 09/12/20      SLP LONG TERM GOAL #2   Title Patient will read aloud sentences and multi-syllabic words, maintaining phonemic accuracy, with 80% accuracy.      Status Partially Met    Target Date 09/19/20      SLP LONG TERM GOAL #3   Title Patient will write grammatical and cogent sentence to complete simple/basic linguistic task with 80% accuracy.    Status Partially Met    Target Date 09/19/20      SLP LONG TERM GOAL #4   Title Patient will complete multi-unit  processing tasks with 80% accuracy without the need of repetition of task instructions or significant delays in responding.    Status Partially Met    Target Date 09/12/20      SLP LONG TERM GOAL #5   Title Patient will demonstrate reading comprehension for paragraphs with 80% accuracy.    Status Partially Met    Target Date 09/19/20            Plan - 08/20/20 1216    Clinical Impression Statement Continue to target auditory discrimination at the word level to increase overall auditory comprehension. Re-explain rules of each activity so pt is aware of the end goal. Continue to target initial sounds then final. Continue to incorporate multimodal communication/ writing as needed. Incorporate Perplexors.    Speech Therapy Frequency 2x / week    Duration Other (comment)    Treatment/Interventions Patient/family education;Language facilitation;Functional tasks    Potential to Achieve Goals Good    Potential Considerations Ability to learn/carryover information;Family/community support;Previous level of function;Cooperation/participation level    Consulted and Agree with Plan of Care Patient           Patient will benefit from skilled therapeutic intervention in order to improve the following deficits and impairments:   Aphasia    Problem List Patient Active Problem List   Diagnosis Date Noted  . Seizures ([place]) 05/21/2020  . Depression 01/16/2016  . Cephalalgia 06/03/2015  . Problems influencing health status 06/03/2015  . 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 Angiopathy 06/03/2015  . Completed stroke ([place]) 06/03/2015  . Hypercholesterolemia 06/03/2015   [name], BS Graduate Clinician [name] 08/20/2020, 12:16 PM  [place] MAIN [facility] SERVICES [address] [place], [place], [address] Phone: [phone]   Fax:  [phone]   Name: ZYGMUND PASSERO MRN: [id number] Date of Birth:  27-Oct-1961

## 2020-08-22 ENCOUNTER — Ambulatory Visit: Payer: PPO | Admitting: Speech Pathology

## 2020-08-22 ENCOUNTER — Encounter: Payer: Self-pay | Admitting: Speech Pathology

## 2020-08-22 ENCOUNTER — Other Ambulatory Visit: Payer: Self-pay

## 2020-08-22 DIAGNOSIS — R4701 Aphasia: Secondary | ICD-10-CM

## 2020-08-22 DIAGNOSIS — I69920 Aphasia following unspecified cerebrovascular disease: Secondary | ICD-10-CM | POA: Diagnosis not present

## 2020-08-22 NOTE — Therapy (Signed)
[place] MAIN [facility] SERVICES [address] [place], [place], [address] Phone: [phone]   Fax:  [phone]  Speech Language Pathology Treatment  Patient Details  Name: Johnathan Campbell MRN: [id number] Date of Birth: 1961/03/21 Referring Provider (SLP): Dr. Manuella Ghazi   Encounter Date: 08/22/2020   End of Session - 08/22/20 1420    Visit Number 54    Number of Visits 76    Date for SLP Re-Evaluation 09/19/20    Authorization Type Medicare    Authorization Time Period 07/16/2020    Authorization - Visit Number 8    Authorization - Number of Visits 10    Progress Note Due on Visit 10    SLP Start Time 1100    SLP Stop Time  1150    SLP Time Calculation (min) 50 min    Activity Tolerance Patient tolerated treatment well           Past Medical History:  Diagnosis Date   Acute intractable headache    unspecified headache type   Allergy    Aphasia S/P CVA    Clot    blood clot   Completed stroke ([place])    Hypercholesterolemia     Past Surgical History:  Procedure Laterality Date   HERNIA REPAIR  twice    There were no vitals filed for this visit.   Subjective Assessment - 08/22/20 1420    Subjective determined, receptive to strategies and new activities                 ADULT SLP TREATMENT - 08/22/20 0001      General Information   Behavior/Cognition Alert;Cooperative;Pleasant mood    HPI Patient is a 59 year old male who suffered an ischemic left MCA infarct with involvement of both temporal and parietal lobes on 05/15/2013. Patient history is significant for aphasia and seizures. Patient received SLP services in 2017 and has returned because he says his speech is "still not where he wants it to be". Patient has been attending teletherapy sessions with the [facility] (TAP) due to COVID-19, however, he is searching for individual therapy because he wants more intensive treatment.       Treatment  Provided   Treatment provided Cognitive-Linquistic      Cognitive-Linquistic Treatment   Treatment focused on Aphasia    Skilled Treatment AUDITORY COMPREHENSION: Pt identified initial and final sounds of single words spoken by clinician. 70% acc answering y/n to questions concerning initial and final sounds of words, increasing accuracy given min-mod cues using writing or multiple repetitions. Overall, pt experienced more difficulty with multisyllabic words. Benefits from written word after answering questions to connect auditory and reading comp. READING/ READING COMPREHENSION: Pt read a short paragraph with 85% fluency, 80% acc independently in reading and answering questions about the text without the text as support.       Assessment / Recommendations / Plan   Plan Continue with current plan of care      Progression Toward Goals   Progression toward goals Progressing toward goals            SLP Education - 08/22/20 1420    Education Details self assessment    Person(s) Educated Patient    Methods Explanation    Comprehension Verbalized understanding;Verbal cues required              SLP Long Term Goals - 07/11/20 1415      SLP LONG TERM GOAL #1   Title  Patient will generate grammatical and cogent sentence to complete abstract/comple linguistic task with 80% accuracy.    Status Partially Met    Target Date 09/12/20      SLP LONG TERM GOAL #2   Title Patient will read aloud sentences and multi-syllabic words, maintaining phonemic accuracy, with 80% accuracy.      Status Partially Met    Target Date 09/19/20      SLP LONG TERM GOAL #3   Title Patient will write grammatical and cogent sentence to complete simple/basic linguistic task with 80% accuracy.    Status Partially Met    Target Date 09/19/20      SLP LONG TERM GOAL #4   Title Patient will complete multi-unit processing tasks with 80% accuracy without the need of repetition of task instructions or significant  delays in responding.    Status Partially Met    Target Date 09/12/20      SLP LONG TERM GOAL #5   Title Patient will demonstrate reading comprehension for paragraphs with 80% accuracy.    Status Partially Met    Target Date 09/19/20            Plan - 08/22/20 1420    Clinical Impression Statement Continue to target auditory discrimination and comprehension. Incorporate images and multiple options for word target. Ensure pt understands instructions beforehand. Target sounds throughout words. Incorporate writing after answering to connect both forms of comprehension. Utilize Perplexors to aid in reading and auditory processing skills.    Speech Therapy Frequency 2x / week    Duration Other (comment)    Treatment/Interventions Patient/family education;Language facilitation;Functional tasks    Potential to Achieve Goals Good    Potential Considerations Ability to learn/carryover information;Family/community support;Previous level of function;Cooperation/participation level    Consulted and Agree with Plan of Care Patient           Patient will benefit from skilled therapeutic intervention in order to improve the following deficits and impairments:   Aphasia    Problem List Patient Active Problem List   Diagnosis Date Noted   Seizures ([place]) 05/21/2020   Depression 01/16/2016   Cephalalgia 06/03/2015   Problems influencing health status 06/03/2015   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 [place], [place], [address] Phone: [phone]   Fax:  [phone]   Name: Johnathan Campbell MRN: [id number] Date of Birth: 1961/04/20

## 2020-08-27 ENCOUNTER — Ambulatory Visit: Payer: PPO | Admitting: Speech Pathology

## 2020-08-27 ENCOUNTER — Other Ambulatory Visit: Payer: Self-pay

## 2020-08-27 DIAGNOSIS — I69920 Aphasia following unspecified cerebrovascular disease: Secondary | ICD-10-CM | POA: Diagnosis not present

## 2020-08-27 DIAGNOSIS — R4701 Aphasia: Secondary | ICD-10-CM

## 2020-08-28 ENCOUNTER — Encounter: Payer: Self-pay | Admitting: Speech Pathology

## 2020-08-28 NOTE — Therapy (Signed)
[place] MAIN [facility] SERVICES [address] [place], [place], [address] Phone: [phone]   Fax:  [phone]  Speech Language Pathology Treatment  Patient Details  Name: Johnathan Campbell MRN: [id number] Date of Birth: 05/25/1961 Referring Provider (SLP): Dr. Manuella Ghazi   Encounter Date: 08/27/2020   End of Session - 08/28/20 0813    Visit Number 70    Number of Visits 82    Date for SLP Re-Evaluation 09/19/20    Authorization Type Medicare    Authorization Time Period 07/16/2020    Authorization - Visit Number 9    Authorization - Number of Visits 10    Progress Note Due on Visit 10    SLP Start Time 1100    SLP Stop Time  1150    SLP Time Calculation (min) 50 min    Activity Tolerance Patient tolerated treatment well           Past Medical History:  Diagnosis Date  . Acute intractable headache    unspecified headache type  . Allergy   . Aphasia S/P CVA   . Clot    blood clot  . Completed stroke ([place])   . Hypercholesterolemia     Past Surgical History:  Procedure Laterality Date  . HERNIA REPAIR  twice    There were no vitals filed for this visit.   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 Patient history is significant for aphasia and seizures. Patient received SLP services in 2017 and has returned because he says his speech is "still not where he wants it to be". Patient has been attending teletherapy sessions with the [facility] (TAP) due to COVID-19, however, he is searching for individual therapy because he wants more intensive treatment.       Treatment Provided   Treatment provided  Cognitive-Linquistic      Cognitive-Linquistic Treatment   Treatment focused on Aphasia    Skilled Treatment Utilized modified aphasia reading battery for auditory/ reading comprehension stiimuli. AUDITORY COMPREHENSION: With phonetically similar words pt identified target when read 3 options with image with 100% acc, and 90% acc answering y/n to 3 options given 2 max rereads of each word. Matched picture to given sentence with 80% acc. 70% acc, selected image that answered question within paragraph given 2 max rereads. Given an image and 3 sentences with complex morpho-syntax, pt responded if they "matched" with 50% acc (ex. The car chased after the dog vs. the dog chased after the car)READING COMPREHENSION: With min cues and visual, pt identified target information with 60% acc (functional reading).Clinician read paragraph, pt read and answered questions given 3 options with 90% acc. EXPRESSION: Pt completed one SFA chart with min cues to put info in correct boxes, formulated written sentences with information given min cues to elaborate with some grammatical errors.       Assessment / Recommendations / Plan   Plan Continue with current plan of care      Progression Toward Goals   Progression toward goals Progressing toward goals            SLP  Education - 08/28/20 0813    Education Details auditory perception, self assessment/ correction    Person(s) Educated Patient    Methods Explanation    Comprehension Verbalized understanding;Verbal cues required              SLP Long Term Goals - 07/11/20 1415      SLP LONG TERM GOAL #1   Title Patient will generate grammatical and cogent sentence to complete abstract/comple linguistic task with 80% accuracy.    Status Partially Met    Target Date 09/12/20      SLP LONG TERM GOAL #2   Title Patient will read aloud sentences and multi-syllabic words, maintaining phonemic accuracy, with 80% accuracy.      Status Partially Met    Target  Date 09/19/20      SLP LONG TERM GOAL #3   Title Patient will write grammatical and cogent sentence to complete simple/basic linguistic task with 80% accuracy.    Status Partially Met    Target Date 09/19/20      SLP LONG TERM GOAL #4   Title Patient will complete multi-unit processing tasks with 80% accuracy without the need of repetition of task instructions or significant delays in responding.    Status Partially Met    Target Date 09/12/20      SLP LONG TERM GOAL #5   Title Patient will demonstrate reading comprehension for paragraphs with 80% accuracy.    Status Partially Met    Target Date 09/19/20            Plan - 08/28/20 0813    Clinical Impression Statement Continue to target auditory discrimination and comprehension. Focus on question comprehension, ex. y/n and wh- questions. Encourage pt to ask follow up questions/ provide dialogue given image to gauge comprehension and cogency. Focus on phonetically similar words, incorporating imitation and selecting target given pictures. Target sounds throughout words, especially in multisyllabic words in the middle and end. Incorporate writing answers.    Speech Therapy Frequency 2x / week    Treatment/Interventions Patient/family education;Language facilitation;Functional tasks    Potential to Achieve Goals Good    Potential Considerations Ability to learn/carryover information;Family/community support;Previous level of function;Cooperation/participation level    Consulted and Agree with Plan of Care Patient           Patient will benefit from skilled therapeutic intervention in order to improve the following deficits and impairments:   Aphasia    Problem List Patient Active Problem List   Diagnosis Date Noted  . Seizures ([place]) 05/21/2020  . Depression 01/16/2016  . Cephalalgia 06/03/2015  . Problems influencing health status 06/03/2015  . 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 Angiopathy  06/03/2015  . Completed stroke ([place]) 06/03/2015  . Hypercholesterolemia 06/03/2015   [name], BS Graduate Clinician [name] 08/28/2020, 8:14 AM  [place] MAIN [facility] SERVICES [address] [place], [place], [address] Phone: [phone]   Fax:  [phone]   Name: Johnathan Campbell MRN: [id number] Date of Birth: [birth year]

## 2020-09-03 ENCOUNTER — Ambulatory Visit: Payer: PPO | Admitting: Speech Pathology

## 2020-09-03 DIAGNOSIS — L02512 Cutaneous abscess of left hand: Secondary | ICD-10-CM | POA: Diagnosis not present

## 2020-09-04 ENCOUNTER — Encounter: Payer: Self-pay | Admitting: Speech Pathology

## 2020-09-04 ENCOUNTER — Ambulatory Visit: Payer: PPO | Admitting: Speech Pathology

## 2020-09-04 NOTE — Therapy (Signed)
[place] MAIN [facility] SERVICES [address] [place], [place], [address] Phone: [phone]   Fax:  [phone]  Speech Language Pathology Treatment/Progress Summary  Speech Therapy Progress Note   Dates of reporting period  07/16/2020   to   09/03/2020   Patient Details  Name: Johnathan Campbell MRN: [id number] Date of Birth: 09/02/1961 Referring Provider (SLP): Dr. Manuella Ghazi   Encounter Date: 09/03/2020   End of Session - 09/04/20 1509    Visit Number 32    Number of Visits 49    Date for SLP Re-Evaluation 09/19/20    Authorization Type Medicare    Authorization Time Period 07/16/2020    Authorization - Visit Number 10    Progress Note Due on Visit 10    SLP Start Time 1410    SLP Stop Time  1500    SLP Time Calculation (min) 50 min    Activity Tolerance Patient tolerated treatment well           Past Medical History:  Diagnosis Date  . Acute intractable headache    unspecified headache type  . Allergy   . Aphasia S/P CVA   . Clot    blood clot  . Completed stroke ([place])   . Hypercholesterolemia     Past Surgical History:  Procedure Laterality Date  . HERNIA REPAIR  twice    There were no vitals filed for this visit.   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 Patient history is significant for aphasia and seizures. Patient received SLP services in 2017 and has returned because he says his speech is "still not where he wants it to be". Patient has been attending teletherapy sessions with the [facility] (T[facility]) due to COVID-19, however, he is searching for individual therapy because he  wants more intensive treatment.       Treatment Provided   Treatment provided Cognitive-Linquistic      Pain Assessment   Pain Assessment No/denies pain      Cognitive-Linquistic Treatment   Treatment focused on Aphasia    Skilled Treatment AUDITORY COMPREHENSION/DISCRIMINATION: Answer question asked with 70% accuracy, errors characterized by poor perception of which "Wh-" form the question took.  Imitate unrelated words with 65% accuracy without need for repetition, improves to 75% given 1-2 repetitions.  VERBAL EXPRESSION: generate appropriate dialog statement given pictured situation with 80% accuracy.  formulate question given situation with 70%accuracy.  Imitate 3-syllable words with 65% accuracy.      Assessment / Recommendations / Plan   Plan Continue with current plan of care      Progression Toward Goals   Progression toward goals Progressing toward goals            SLP Education - 09/04/20 1508    Education Details auditory discrimination    Person(s) Educated Patient    Methods Explanation    Comprehension Verbalized understanding              SLP Long Term Goals - 09/04/20 1512      SLP LONG TERM GOAL #1  Title Patient will generate grammatical and cogent sentence to complete abstract/comple linguistic task with 80% accuracy.    Status Partially Met    Target Date 09/12/20      SLP LONG TERM GOAL #2   Title Patient will read aloud sentences and multi-syllabic words, maintaining phonemic accuracy, with 80% accuracy.      Status Partially Met    Target Date 09/12/20      SLP LONG TERM GOAL #3   Title Patient will write grammatical and cogent sentence to complete simple/basic linguistic task with 80% accuracy.    Status Partially Met    Target Date 09/12/20      SLP LONG TERM GOAL #4   Title Patient will complete multi-unit processing tasks with 80% accuracy without the need of repetition of task instructions or significant delays in responding.     Status Partially Met    Target Date 09/12/20      SLP LONG TERM GOAL #5   Title Patient will demonstrate reading comprehension for paragraphs with 80% accuracy.    Status Partially Met    Target Date 09/12/20            Plan - 09/04/20 1510    Clinical Impression Statement The patient is making progress towards all goals.  Shift to focus on auditory discrimination/comprehension has been helpful.  Will plan to continue to target auditory discrimination and comprehension. Focus on question comprehension, ex. y/n and wh- questions. Encourage pt to ask follow up questions/ provide dialogue given image to gauge comprehension and cogency. Focus on phonetically similar words, incorporating imitation and selecting target given pictures. Target sounds throughout words, especially in multisyllabic words in the middle and end. Incorporate writing answers.    Speech Therapy Frequency 2x / week    Duration 12 weeks    Treatment/Interventions Patient/family education;Language facilitation;Functional tasks    Potential Considerations Ability to learn/carryover information;Family/community support;Previous level of function;Cooperation/participation level    Consulted and Agree with Plan of Care Patient           Patient will benefit from skilled therapeutic intervention in order to improve the following deficits and impairments:   Aphasia    Problem List Patient Active Problem List   Diagnosis Date Noted  . Seizures ([place]) 05/21/2020  . Depression 01/16/2016  . Cephalalgia 06/03/2015  . Problems influencing health status 06/03/2015  . 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 Angiopathy 06/03/2015  . Completed stroke ([place]) 06/03/2015  . Hypercholesterolemia 06/03/2015   [name], MS/CCC- SLP  Lou Miner 09/04/2020, 3:14 PM  [place] MAIN [facility] SERVICES [address] [place], [place], [address] Phone:  [phone]   Fax:  [phone]   Name: Johnathan Campbell MRN: [id number] Date of Birth: May 03, 1961

## 2020-09-05 DIAGNOSIS — L02512 Cutaneous abscess of left hand: Secondary | ICD-10-CM | POA: Diagnosis not present

## 2020-09-09 ENCOUNTER — Other Ambulatory Visit: Payer: Self-pay

## 2020-09-09 ENCOUNTER — Ambulatory Visit: Payer: PPO | Attending: Neurology | Admitting: Speech Pathology

## 2020-09-09 ENCOUNTER — Encounter: Payer: Self-pay | Admitting: Speech Pathology

## 2020-09-09 DIAGNOSIS — R4701 Aphasia: Secondary | ICD-10-CM | POA: Diagnosis not present

## 2020-09-09 NOTE — Therapy (Signed)
[place] MAIN [facility] SERVICES [address] [place], [place], [address] Phone: [phone]   Fax:  [phone]  Speech Language Pathology Treatment  Patient Details  Name: Johnathan Campbell MRN: [id number] Date of Birth: [birth year] Referring Provider (SLP): Dr. Manuella Ghazi   Encounter Date: 09/09/2020   End of Session - 09/09/20 1521    Visit Number 72    Number of Visits 82    Date for SLP Re-Evaluation 09/19/20    Authorization Type Medicare    Authorization Time Period 08/09/2020    Authorization - Visit Number 1    Authorization - Number of Visits 10    Progress Note Due on Visit 10    SLP Start Time 1400    SLP Stop Time  1450    SLP Time Calculation (min) 50 min    Activity Tolerance Patient tolerated treatment well           Past Medical History:  Diagnosis Date  . Acute intractable headache    unspecified headache type  . Allergy   . Aphasia S/P CVA   . Clot    blood clot  . Completed stroke ([place])   . Hypercholesterolemia     Past Surgical History:  Procedure Laterality Date  . HERNIA REPAIR  twice    There were no vitals filed for this visit.   Subjective Assessment - 09/09/20 1520    Subjective notes difficulty with aud discrim at all levels, esp word    Currently in Pain? 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 Patient history is significant for aphasia and seizures. Patient received SLP services in 2017 and has returned because he says his speech is "still not where he wants it to be". Patient has been attending teletherapy sessions with the [facility] ([facility]) due to COVID-19, however, he is searching for individual therapy because he wants more intensive  treatment.       Treatment Provided   Treatment provided Cognitive-Linquistic      Cognitive-Linquistic Treatment   Treatment focused on Aphasia    Skilled Treatment AUDITORY COMPREHENSION/DISCRIMINATION: Answered simple personal question given minimal repeating from clinician. Pt displayed difficulty when asked questions related to the topic at hand in greater detail. Imitate unrelated monosyllabic words with 50% acc, inc to 75% given 2-3 repetition. Imitate unrelated bisyllabic words with 65% acc, inc acc given 2-3 repetitions. Note that the clinician's mouth was covered during this activity to inc difficulty. Within a FO2 pt identified target from clinician verbalization 80% acc. 65% acc simple y/n questions.VERBAL EXPRESSION: Pt demonstrated difficulty generating a question given a scenario, provided grammatically correct sentences with vital components incorrect (ex. "size" for "price" of shoes). Cogency in conversation when pt selects the topic is fair and requires occasional confirmation questions from clinician to follow.        Assessment / Recommendations / Plan   Plan Continue with current plan of care      Progression Toward Goals   Progression toward goals Progressing toward goals            SLP Education - 09/09/20 1520    Education Details increased practice may improve deficits/  assist in understanding ways to deal with them    Person(s) Educated Patient    Methods Explanation    Comprehension Verbalized understanding              SLP Long Term Goals - 09/04/20 1512      SLP LONG TERM GOAL #1   Title Patient will generate grammatical and cogent sentence to complete abstract/comple linguistic task with 80% accuracy.    Status Partially Met    Target Date 09/12/20      SLP LONG TERM GOAL #2   Title Patient will read aloud sentences and multi-syllabic words, maintaining phonemic accuracy, with 80% accuracy.      Status Partially Met    Target Date 09/12/20       SLP LONG TERM GOAL #3   Title Patient will write grammatical and cogent sentence to complete simple/basic linguistic task with 80% accuracy.    Status Partially Met    Target Date 09/12/20      SLP LONG TERM GOAL #4   Title Patient will complete multi-unit processing tasks with 80% accuracy without the need of repetition of task instructions or significant delays in responding.    Status Partially Met    Target Date 09/12/20      SLP LONG TERM GOAL #5   Title Patient will demonstrate reading comprehension for paragraphs with 80% accuracy.    Status Partially Met    Target Date 09/12/20            Plan - 09/09/20 1521    Clinical Impression Statement Continue to target auditory discrimination/ verbal expression following comprehension and in conversation. Continue to incorporate yes/ no and simple wh-questions based on pt experiences. Provide scenarios/ problems for pt to solve verbally or request more info. Encourage questions if pt does not understand or wishes to gather additional info. Discrimination between phonetically similar words (imitation, FO3-4). Incorporate writing answers.    Speech Therapy Frequency 2x / week    Duration 12 weeks    Treatment/Interventions Patient/family education;Language facilitation;Functional tasks    Potential to Achieve Goals Good    Potential Considerations Ability to learn/carryover information;Family/community support;Previous level of function;Cooperation/participation level    Consulted and Agree with Plan of Care Patient           Patient will benefit from skilled therapeutic intervention in order to improve the following deficits and impairments:   Aphasia    Problem List Patient Active Problem List   Diagnosis Date Noted  . Seizures ([place]) 05/21/2020  . Depression 01/16/2016  . Cephalalgia 06/03/2015  . Problems influencing health status 06/03/2015  . 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  Angiopathy 06/03/2015  . Completed stroke ([place]) 06/03/2015  . Hypercholesterolemia 06/03/2015   [name], BS Graduate Clinician Lou Miner 09/09/2020, 4:10 PM  [place] MAIN [facility] SERVICES [address] [place], [place], [address] Phone: [phone]   Fax:  [phone]   Name: Johnathan Campbell MRN: [id number] Date of Birth: 09/08/1961

## 2020-09-11 ENCOUNTER — Other Ambulatory Visit: Payer: Self-pay

## 2020-09-11 ENCOUNTER — Ambulatory Visit: Payer: PPO | Admitting: Speech Pathology

## 2020-09-11 ENCOUNTER — Encounter: Payer: Self-pay | Admitting: Speech Pathology

## 2020-09-11 DIAGNOSIS — R4701 Aphasia: Secondary | ICD-10-CM | POA: Diagnosis not present

## 2020-09-11 NOTE — Therapy (Signed)
[place] MAIN [facility] SERVICES [address] [place], [place], [address] Phone: [phone]   Fax:  [phone]  Speech Language Pathology Treatment  Patient Details  Name: Johnathan Campbell MRN: [id number] Date of Birth: 1961/05/25 Referring Provider (SLP): Dr. Manuella Ghazi   Encounter Date: 09/11/2020   End of Session - 09/11/20 1503    Visit Number 28    Number of Visits 82    Date for SLP Re-Evaluation 09/19/20    Authorization Type Medicare    Authorization Time Period 09/09/2020    Authorization - Visit Number 2    Authorization - Number of Visits 10    Progress Note Due on Visit 10    SLP Start Time 1405    SLP Stop Time  1455    SLP Time Calculation (min) 50 min    Activity Tolerance Patient tolerated treatment well           Past Medical History:  Diagnosis Date  . Acute intractable headache    unspecified headache type  . Allergy   . Aphasia S/P CVA   . Clot    blood clot  . Completed stroke ([place])   . Hypercholesterolemia     Past Surgical History:  Procedure Laterality Date  . HERNIA REPAIR  twice    There were no vitals filed for this visit.   Subjective Assessment - 09/11/20 1502    Subjective notes similarity of ADD in terms of focus, difficulty understanding "random" words    Currently in Pain? 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 Patient history is significant for aphasia and seizures. Patient received SLP services in 2017 and has returned because he says his speech is "still not where he wants it to be". Patient has been attending teletherapy sessions with the [facility] ([facility]) due to COVID-19, however, he is searching for individual therapy because  he wants more intensive treatment.       Treatment Provided   Treatment provided Cognitive-Linquistic      Cognitive-Linquistic Treatment   Treatment focused on Aphasia    Skilled Treatment AUDITORY COMPREHENSION/DISCRIMINATION: Answered yes/ no questions with 1-2 repetitions with 70% acc, acc increased given writing of key words. Imitate unrelated monosyllabic words with 53% acc, increased accuracy given 2-3 repetition. Pt spelled what he thought he heard aloud to confirm or used the word in a sentence. Imitate unrelated bisyllabic words with 40% acc, increased given 2-3 repetitions. Clinician's mouth was visible during this activity. Continues to provide phonemic/ semantic paraphasias (ex. "faucet" for "drain"). VERBAL EXPRESSION: Pt provided potential dialogue given images of a scene. Utterances were short (often 3-4 words) but maintained cogency. In conversation, pt requires some confirming questions to provide desired information for clinician. WRITING: Pt wrote examples of dialogue he provided, at times leaving out function words (ex. With, after) but always providing the nouns.        Assessment / Recommendations / Plan   Plan Continue with current plan of care      Progression Toward Goals   Progression toward goals Progressing toward goals            SLP Education - 09/11/20 1502  Education Details working on "random" words to improve overall understanding, impacts overall expression    Person(s) Educated Patient    Methods Explanation    Comprehension Verbalized understanding;Verbal cues required              SLP Long Term Goals - 09/04/20 1512      SLP LONG TERM GOAL #1   Title Patient will generate grammatical and cogent sentence to complete abstract/comple linguistic task with 80% accuracy.    Status Partially Met    Target Date 09/12/20      SLP LONG TERM GOAL #2   Title Patient will read aloud sentences and multi-syllabic words, maintaining phonemic accuracy,  with 80% accuracy.      Status Partially Met    Target Date 09/12/20      SLP LONG TERM GOAL #3   Title Patient will write grammatical and cogent sentence to complete simple/basic linguistic task with 80% accuracy.    Status Partially Met    Target Date 09/12/20      SLP LONG TERM GOAL #4   Title Patient will complete multi-unit processing tasks with 80% accuracy without the need of repetition of task instructions or significant delays in responding.    Status Partially Met    Target Date 09/12/20      SLP LONG TERM GOAL #5   Title Patient will demonstrate reading comprehension for paragraphs with 80% accuracy.    Status Partially Met    Target Date 09/12/20            Plan - 09/11/20 1503    Clinical Impression Statement Continue to target auditory discrimination/ repetition, multisyllabic and bisyllabic. Incorporate yes/ no and simple wh- questions ensuring understanding of the question. Introduce verbal problem solving or requesting necessary information. Visual words and verbal from clinician in FO2-4, especially phonemically similar.    Speech Therapy Frequency 2x / week    Duration 12 weeks    Treatment/Interventions Patient/family education;Language facilitation;Functional tasks    Potential to Achieve Goals Good    Potential Considerations Ability to learn/carryover information;Family/community support;Previous level of function;Cooperation/participation level    Consulted and Agree with Plan of Care Patient           Patient will benefit from skilled therapeutic intervention in order to improve the following deficits and impairments:   Aphasia    Problem List Patient Active Problem List   Diagnosis Date Noted  . Seizures ([place]) 05/21/2020  . Depression 01/16/2016  . Cephalalgia 06/03/2015  . Problems influencing health status 06/03/2015  . 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 Angiopathy 06/03/2015  . Completed stroke ([place])  06/03/2015  . Hypercholesterolemia 06/03/2015   [name], BS Graduate Clinician Lou Miner 09/11/2020, 3:31 PM  [place] MAIN [facility] SERVICES [address] [place], [place], [address] Phone: [phone]   Fax:  [phone]   Name: Johnathan Campbell MRN: [id number] Date of Birth: 1961/06/12

## 2020-09-16 ENCOUNTER — Encounter: Payer: Self-pay | Admitting: Speech Pathology

## 2020-09-16 ENCOUNTER — Ambulatory Visit: Payer: PPO | Admitting: Speech Pathology

## 2020-09-16 ENCOUNTER — Other Ambulatory Visit: Payer: Self-pay

## 2020-09-16 DIAGNOSIS — R4701 Aphasia: Secondary | ICD-10-CM

## 2020-09-16 NOTE — Therapy (Signed)
[place] MAIN [facility] SERVICES [address] [place], [place], [address] Phone: [phone]   Fax:  [phone]  Speech Language Pathology Treatment  Patient Details  Name: Johnathan Campbell MRN: [id number] Date of Birth: 05-05-1961 Referring Provider (SLP): Dr. Manuella Ghazi   Encounter Date: 09/16/2020   End of Session - 09/16/20 1533    Visit Number 68    Number of Visits 82    Date for SLP Re-Evaluation 09/19/20    Authorization Type Medicare    Authorization Time Period 09/09/2020    Authorization - Visit Number 3    Authorization - Number of Visits 10    Progress Note Due on Visit 10    SLP Start Time 1400    SLP Stop Time  1450    SLP Time Calculation (min) 50 min    Activity Tolerance Patient tolerated treatment well           Past Medical History:  Diagnosis Date   Acute intractable headache    unspecified headache type   Allergy    Aphasia S/P CVA    Clot    blood clot   Completed stroke ([place])    Hypercholesterolemia     Past Surgical History:  Procedure Laterality Date   HERNIA REPAIR  twice    There were no vitals filed for this visit.   Subjective Assessment - 09/16/20 1532    Subjective increased accuracy repeating when attentive to clinician, engaged    Currently in Pain? 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 Patient history is significant for aphasia and seizures. Patient received SLP services in 2017 and has returned because he says his speech is "still not where he wants it to be". Patient has been attending teletherapy sessions with the [facility] ([facility]) due to COVID-19, however, he is searching for individual therapy because he wants more  intensive treatment.       Treatment Provided   Treatment provided Cognitive-Linquistic      Cognitive-Linquistic Treatment   Treatment focused on Aphasia    Skilled Treatment AUDITORY COMPREHENSION/DISCRIMINATION: 49% acc repeating unrelated monosyllabic words, increased accuracy given 2-3 repetitions. 40% acc repeating unrelated bisyllabic words, increased accuracy given 2-3 repetitions. Pt said "bulb" and "light" for lamp, repeating semantic/ phonemic paraphasias at times. VERBAL EXPRESSION: Pt provided helpful questions to ask given a situation (read situation on paper), Read aloud with cogency, 90% acc self corrects or rereads when prompted by clinician. 80% acc independently providing questions, increased cogency given clinician cueing/ questions. WRITING: Pt wrote a response from situational questions activity, "How much is the cost to shoes cost" for "How much do these shoes cost". Pt corrected sentence after prompting to read writing aloud and cues to correct words from clinician.        Assessment / Recommendations / Plan   Plan Continue with current plan of care      Progression Toward Goals   Progression toward goals Progressing toward goals            SLP Education - 09/16/20 1533    Education Details auditory comprehension and understandable writing/ verbal expression    Person(s) Educated Patient  Methods Explanation    Comprehension Verbalized understanding;Verbal cues required              SLP Long Term Goals - 09/04/20 1512      SLP LONG TERM GOAL #1   Title Patient will generate grammatical and cogent sentence to complete abstract/comple linguistic task with 80% accuracy.    Status Partially Met    Target Date 09/12/20      SLP LONG TERM GOAL #2   Title Patient will read aloud sentences and multi-syllabic words, maintaining phonemic accuracy, with 80% accuracy.      Status Partially Met    Target Date 09/12/20      SLP LONG TERM GOAL #3   Title Patient  will write grammatical and cogent sentence to complete simple/basic linguistic task with 80% accuracy.    Status Partially Met    Target Date 09/12/20      SLP LONG TERM GOAL #4   Title Patient will complete multi-unit processing tasks with 80% accuracy without the need of repetition of task instructions or significant delays in responding.    Status Partially Met    Target Date 09/12/20      SLP LONG TERM GOAL #5   Title Patient will demonstrate reading comprehension for paragraphs with 80% accuracy.    Status Partially Met    Target Date 09/12/20            Plan - 09/16/20 1534    Clinical Impression Statement Continue to target audiory discrimination/ repetition at the single word level. Remind pt to pay attention to clinician's mouth to assist, spelling aloud if this supports understanding. Incorporate yes/ no and wh- questions (ensuring understanding of the meaning of these words) first. Requesting necessary information given verbal or written info. FO3-4 word identification.    Speech Therapy Frequency 2x / week    Duration 12 weeks    Treatment/Interventions Patient/family education;Language facilitation;Functional tasks    Potential to Achieve Goals Good    Potential Considerations Ability to learn/carryover information;Family/community support;Previous level of function;Cooperation/participation level    Consulted and Agree with Plan of Care Patient           Patient will benefit from skilled therapeutic intervention in order to improve the following deficits and impairments:   Aphasia    Problem List Patient Active Problem List   Diagnosis Date Noted   Seizures ([place]) 05/21/2020   Depression 01/16/2016   Cephalalgia 06/03/2015   Problems influencing health status 06/03/2015   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

## 2020-09-18 ENCOUNTER — Ambulatory Visit: Payer: PPO | Admitting: Speech Pathology

## 2020-09-23 ENCOUNTER — Ambulatory Visit: Payer: PPO | Admitting: Speech Pathology

## 2020-09-23 ENCOUNTER — Other Ambulatory Visit: Payer: Self-pay

## 2020-09-23 DIAGNOSIS — R4701 Aphasia: Secondary | ICD-10-CM | POA: Diagnosis not present

## 2020-09-24 ENCOUNTER — Encounter: Payer: Self-pay | Admitting: Speech Pathology

## 2020-09-24 NOTE — Therapy (Addendum)
[place] MAIN [facility] SERVICES [address] [place], [place], [address] Phone: [phone]   Fax:  [phone]  Speech Language Pathology Treatment  Patient Details  Name: Johnathan Campbell MRN: [id number] Date of Birth: [birth year] Referring Provider (SLP): Dr. Vertell Novak Date: 09/23/2020    Past Medical History:  Diagnosis Date  . Acute intractable headache    unspecified headache type  . Allergy   . Aphasia S/P CVA   . Clot    blood clot  . Completed stroke ([place])   . Hypercholesterolemia     Past Surgical History:  Procedure Laterality Date  . HERNIA REPAIR  twice    There were no vitals filed for this visit.          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 Patient history is significant for aphasia and seizures. Patient received SLP services in 2017 and has returned because he says his speech is "still not where he wants it to be". Patient has been attending teletherapy sessions with the [facility] ([facility]) due to COVID-19, however, he is searching for individual therapy because he wants more intensive treatment.       Treatment Provided   Treatment provided Cognitive-Linquistic      Pain Assessment   Pain Assessment No/denies pain      Cognitive-Linquistic Treatment   Treatment focused on Aphasia    Skilled Treatment AUDITORY DISCRIMINATION: Correct incorrect sound in sentences (eg. "A cow goes boo") with70% accuracy.  Identify stated word/picture easily.  Repeat words (no visual cues) with 70% accuracy.  MOTOR SPEECH: read/imitate words of increasing length with 60% accuracy.        Assessment / Recommendations / Plan   Plan Continue with current plan of care      Progression Toward  Goals   Progression toward goals Progressing toward goals                SLP Long Term Goals - 10/01/20 0848      SLP LONG TERM GOAL #1   Title Patient will generate grammatical and cogent sentence to complete abstract/comple linguistic task with 80% accuracy.    Time 8    Period Weeks    Status Partially Met    Target Date 11/21/20      SLP LONG TERM GOAL #2   Title Patient will read aloud sentences and multi-syllabic words, maintaining phonemic accuracy, with 80% accuracy.      Time 8    Period Weeks    Status Partially Met    Target Date 11/21/20      SLP LONG TERM GOAL #3   Title Patient will write grammatical and cogent sentence to complete simple/basic linguistic task with 80% accuracy.    Time 8    Period Weeks    Status Partially Met    Target Date 11/21/20      SLP LONG TERM GOAL #4   Title Patient will complete multi-unit processing tasks with 80% accuracy without the need of repetition of task instructions or significant delays in responding.    Time 8    Period Weeks    Status Partially Met    Target Date 11/21/20      SLP LONG TERM GOAL #5  Title Patient will demonstrate reading comprehension for paragraphs with 80% accuracy.    Time 8    Period Weeks    Status Partially Met    Target Date 11/21/20             Patient will benefit from skilled therapeutic intervention in order to improve the following deficits and impairments:   Aphasia    Problem List Patient Active Problem List   Diagnosis Date Noted  . Seizures ([place]) 05/21/2020  . Depression 01/16/2016  . Cephalalgia 06/03/2015  . Problems influencing health status 06/03/2015  . 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 Angiopathy 06/03/2015  . Completed stroke ([place]) 06/03/2015  . Hypercholesterolemia 06/03/2015   [name], BS Graduate Clinician Lou Miner 10/01/2020, 8:58 AM  [place] MAIN [facility]  SERVICES [address] [place], [place], [address] Phone: [phone]   Fax:  [phone]   Name: Johnathan Campbell MRN: [id number] Date of Birth: February 08, 1961

## 2020-09-25 ENCOUNTER — Encounter: Payer: Self-pay | Admitting: Speech Pathology

## 2020-09-25 ENCOUNTER — Other Ambulatory Visit: Payer: Self-pay

## 2020-09-25 ENCOUNTER — Ambulatory Visit: Payer: PPO | Admitting: Speech Pathology

## 2020-09-25 DIAGNOSIS — R4701 Aphasia: Secondary | ICD-10-CM

## 2020-09-25 NOTE — Therapy (Signed)
[place] MAIN [facility] SERVICES [address] [place], [place], [address] Phone: [phone]   Fax:  [phone]  Speech Language Pathology Treatment  Patient Details  Name: Johnathan Campbell MRN: [id number] Date of Birth: 1961-10-03 Referring Provider (SLP): Dr. Manuella Ghazi   Encounter Date: 09/25/2020   End of Session - 09/25/20 1540    Visit Number 71    Number of Visits 82    Date for SLP Re-Evaluation 09/19/20    Authorization Type Medicare    Authorization Time Period 09/09/2020    Authorization - Visit Number 5    Progress Note Due on Visit 10    SLP Start Time 1400    SLP Stop Time  1450    SLP Time Calculation (min) 50 min    Activity Tolerance Patient tolerated treatment well           Past Medical History:  Diagnosis Date  . Acute intractable headache    unspecified headache type  . Allergy   . Aphasia S/P CVA   . Clot    blood clot  . Completed stroke ([place])   . Hypercholesterolemia     Past Surgical History:  Procedure Laterality Date  . HERNIA REPAIR  twice    There were no vitals filed for this visit.   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 Patient history is significant for aphasia and seizures. Patient received SLP services in 2017 and has returned because he says his speech is "still not where he wants it to be". Patient has been attending teletherapy sessions with the [facility] ([facility]) due to COVID-19, however, he is searching for individual therapy because he wants more intensive treatment.       Treatment Provided   Treatment provided  Cognitive-Linquistic      Pain Assessment   Pain Assessment No/denies pain      Cognitive-Linquistic Treatment   Treatment focused on Aphasia    Skilled Treatment AUDITORY DISCRIMINATION: Correct incorrect sound in sentences (eg. "A cow goes boo") with70% accuracy.  Identify stated word/picture easily.  Repeat words (no visual cues) with 70% accuracy.  MOTOR SPEECH: read/imitate words of increasing length with 60% accuracy.        Assessment / Recommendations / Plan   Plan Continue with current plan of care      Progression Toward Goals   Progression toward goals Progressing toward goals            SLP Education - 09/25/20 1539    Education Details sound discrimination    Person(s) Educated Patient    Methods Explanation    Comprehension Verbalized understanding              SLP Long Term Goals - 09/04/20 1512      SLP LONG TERM GOAL #1   Title Patient will generate grammatical and cogent sentence to complete abstract/comple linguistic task with 80% accuracy.    Status Partially Met    Target Date 09/12/20      SLP LONG TERM GOAL #2   Title  Patient will read aloud sentences and multi-syllabic words, maintaining phonemic accuracy, with 80% accuracy.      Status Partially Met    Target Date 09/12/20      SLP LONG TERM GOAL #3   Title Patient will write grammatical and cogent sentence to complete simple/basic linguistic task with 80% accuracy.    Status Partially Met    Target Date 09/12/20      SLP LONG TERM GOAL #4   Title Patient will complete multi-unit processing tasks with 80% accuracy without the need of repetition of task instructions or significant delays in responding.    Status Partially Met    Target Date 09/12/20      SLP LONG TERM GOAL #5   Title Patient will demonstrate reading comprehension for paragraphs with 80% accuracy.    Status Partially Met    Target Date 09/12/20            Plan - 09/25/20 1540    Clinical Impression Statement The  patient is demonstrating improved auditory comprehension with focus on sound discrimination skills.    Speech Therapy Frequency 2x / week    Duration 12 weeks    Treatment/Interventions Patient/family education;Language facilitation;Functional tasks    Potential to Achieve Goals Good    Potential Considerations Ability to learn/carryover information;Family/community support;Previous level of function;Cooperation/participation level    Consulted and Agree with Plan of Care Patient           Patient will benefit from skilled therapeutic intervention in order to improve the following deficits and impairments:   Aphasia    Problem List Patient Active Problem List   Diagnosis Date Noted  . Seizures ([place]) 05/21/2020  . Depression 01/16/2016  . Cephalalgia 06/03/2015  . Problems influencing health status 06/03/2015  . 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 Angiopathy 06/03/2015  . Completed stroke ([place]) 06/03/2015  . Hypercholesterolemia 06/03/2015   [name], MS/CCC- SLP  Lou Miner 09/25/2020, 3:41 PM  [place] MAIN [facility] SERVICES [address] [place], [place], [address] Phone: [phone]   Fax:  [phone]   Name: Johnathan Campbell MRN: [id number] Date of Birth: 1961-09-21

## 2020-10-01 ENCOUNTER — Ambulatory Visit: Payer: PPO | Admitting: Speech Pathology

## 2020-10-01 ENCOUNTER — Other Ambulatory Visit: Payer: Self-pay

## 2020-10-01 ENCOUNTER — Encounter: Payer: Self-pay | Admitting: Speech Pathology

## 2020-10-01 DIAGNOSIS — R4701 Aphasia: Secondary | ICD-10-CM | POA: Diagnosis not present

## 2020-10-01 NOTE — Addendum Note (Signed)
Addended by: Robb Matar on: 10/01/2020 09:02 AM   Modules accepted: Orders

## 2020-10-01 NOTE — Therapy (Signed)
[place] MAIN [facility] SERVICES [address] [place], [place], [address] Phone: [phone]   Fax:  [phone]  Speech Language Pathology Treatment  Patient Details  Name: Johnathan Campbell MRN: [id number] Date of Birth: April 18, 1961 Referring Provider (SLP): Dr. Manuella Ghazi   Encounter Date: 10/01/2020   End of Session - 10/01/20 1416    Visit Number 36    Number of Visits 82    Date for SLP Re-Evaluation 09/19/20    Authorization Type Medicare    Authorization Time Period 09/09/2020    Authorization - Visit Number 6    Authorization - Number of Visits 10    Progress Note Due on Visit 10    SLP Start Time 1000    SLP Stop Time  1050    SLP Time Calculation (min) 50 min    Activity Tolerance Patient tolerated treatment well           Past Medical History:  Diagnosis Date  . Acute intractable headache    unspecified headache type  . Allergy   . Aphasia S/P CVA   . Clot    blood clot  . Completed stroke ([place])   . Hypercholesterolemia     Past Surgical History:  Procedure Laterality Date  . HERNIA REPAIR  twice    There were no vitals filed for this visit.   Subjective Assessment - 10/01/20 1415    Subjective repetitive activities to improve understanding/ language skills    Currently in Pain? No/denies                 ADULT SLP TREATMENT - 10/01/20 1408      General Information   Behavior/Cognition Alert;Cooperative;Pleasant mood    HPI Patient is a 59 year old male who suffered an ischemic left MCA infarct with involvement of both temporal and parietal lobes on 05/15/2013. Patient history is significant for aphasia and seizures. Patient received SLP services in 2017 and has returned because he says his speech is "still not where he wants it to be". Patient has been attending teletherapy sessions with the [facility] ([facility]) due to COVID-19, however, he is searching for individual therapy because he wants more  intensive treatment.       Treatment Provided   Treatment provided Cognitive-Linquistic      Pain Assessment   Pain Assessment No/denies pain      Cognitive-Linquistic Treatment   Treatment focused on Aphasia    Skilled Treatment AUDITORY DISCRIMINATION: 78% acc repeating monosyllabic words, often requiring no or 1 repetition. Increasing acc to 100% given additional repetitions and written target. Pt benefits from focusing on clinician's mouth. Identified incorrect information in a sentence after reading easily. Increased difficulty when read by clinician. After repeating 2-3 times, identified main idea of sentence. Simple answers to provide what should be changed in the sentence. MOTOR SPEECH: Read trisyllabic/ multisyllabic words with 65% acc, improved given clinician example. Some words, ex. Acceptance, were especially difficult for pt.       Assessment / Recommendations / Plan   Plan Continue with current plan of care      Progression Toward Goals   Progression toward goals Progressing toward goals            SLP Education - 10/01/20 1416    Education Details auditory discrimination, elaborating/ explaing thoughts well    Person(s) Educated Patient    Methods Explanation    Comprehension Verbalized understanding  SLP Long Term Goals - 10/01/20 0848      SLP LONG TERM GOAL #1   Title Patient will generate grammatical and cogent sentence to complete abstract/comple linguistic task with 80% accuracy.    Time 8    Period Weeks    Status Partially Met    Target Date 11/21/20      SLP LONG TERM GOAL #2   Title Patient will read aloud sentences and multi-syllabic words, maintaining phonemic accuracy, with 80% accuracy.      Time 8    Period Weeks    Status Partially Met    Target Date 11/21/20      SLP LONG TERM GOAL #3   Title Patient will write grammatical and cogent sentence to complete simple/basic linguistic task with 80% accuracy.    Time 8     Period Weeks    Status Partially Met    Target Date 11/21/20      SLP LONG TERM GOAL #4   Title Patient will complete multi-unit processing tasks with 80% accuracy without the need of repetition of task instructions or significant delays in responding.    Time 8    Period Weeks    Status Partially Met    Target Date 11/21/20      SLP LONG TERM GOAL #5   Title Patient will demonstrate reading comprehension for paragraphs with 80% accuracy.    Time 8    Period Weeks    Status Partially Met    Target Date 11/21/20            Plan - 10/01/20 1417    Clinical Impression Statement Incorporate identifying target given similar words (aud and reading). Pt demonstrating improvement in auditory comprehension at the word level especially. Incorporate writing, focusing on overall cogency, into tx. Encoourage reading multisyllabic words at a slow pace. Focus on elaborating and sharing thoughts in a cogent conversation.    Speech Therapy Frequency 2x / week    Duration 12 weeks    Treatment/Interventions Patient/family education;Language facilitation;Functional tasks    Potential to Achieve Goals Good    Potential Considerations Ability to learn/carryover information;Family/community support;Previous level of function;Cooperation/participation level    Consulted and Agree with Plan of Care Patient           Patient will benefit from skilled therapeutic intervention in order to improve the following deficits and impairments:   Aphasia    Problem List Patient Active Problem List   Diagnosis Date Noted  . Seizures ([place]) 05/21/2020  . Depression 01/16/2016  . Cephalalgia 06/03/2015  . Problems influencing health status 06/03/2015  . 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 Angiopathy 06/03/2015  . Completed stroke ([place]) 06/03/2015  . Hypercholesterolemia 06/03/2015   [name], BS Graduate Clinician [name] 10/01/2020, 2:21 PM  [place] MAIN [facility] SERVICES [address] [place], [place], [address] Phone: [phone]   Fax:  [phone]   Name: AJAX SCHROLL MRN: [id number] Date of Birth: 06-17-1961

## 2020-10-03 ENCOUNTER — Ambulatory Visit: Payer: PPO | Attending: Neurology | Admitting: Speech Pathology

## 2020-10-03 ENCOUNTER — Other Ambulatory Visit: Payer: Self-pay

## 2020-10-03 DIAGNOSIS — R41841 Cognitive communication deficit: Secondary | ICD-10-CM | POA: Diagnosis not present

## 2020-10-03 DIAGNOSIS — R4701 Aphasia: Secondary | ICD-10-CM | POA: Diagnosis not present

## 2020-10-03 NOTE — Therapy (Signed)
[place] MAIN [facility] SERVICES [address] [place], [place], [address] Phone: [phone]   Fax:  [phone]  Speech Language Pathology Treatment  Patient Details  Name: Johnathan Campbell MRN: [id number] Date of Birth: 1961-10-22 Referring Provider (SLP): Dr. Manuella Ghazi   Encounter Date: 10/03/2020   End of Session - 10/03/20 1304    Visit Number 34    Number of Visits 82    Date for SLP Re-Evaluation 09/19/20    Authorization Type Medicare    Authorization Time Period 09/09/2020    Authorization - Visit Number 7    Authorization - Number of Visits 10    Progress Note Due on Visit 10    SLP Start Time 1000    SLP Stop Time  1050    SLP Time Calculation (min) 50 min    Activity Tolerance Patient tolerated treatment well           Past Medical History:  Diagnosis Date  . Acute intractable headache    unspecified headache type  . Allergy   . Aphasia S/P CVA   . Clot    blood clot  . Completed stroke ([place])   . Hypercholesterolemia     Past Surgical History:  Procedure Laterality Date  . HERNIA REPAIR  twice    There were no vitals filed for this visit.   Subjective Assessment - 10/03/20 1250    Subjective Pleasant and interactive with this unfamiliar therapist    Currently in Pain? No/denies                 ADULT SLP TREATMENT - 10/03/20 1000      General Information   Behavior/Cognition Alert;Cooperative;Pleasant mood      Treatment Provided   Treatment provided Cognitive-Linquistic      Pain Assessment   Pain Assessment No/denies pain      Cognitive-Linquistic Treatment   Treatment focused on Aphasia    Skilled Treatment EXPRESSIVE LANGUAGE: Therapist facilitated mod-complex conversation regarding pt goals and limitations. Patient used compensations such as word substitution, description, and gestures effectively. He required occasional cues for awareness of when breakdowns resulted in listener confusion; written  cues were effective. Occasional min cues to use slower rate for improved fluency. AUDITORY DISCRIMINATION: Pt repeated monosyllabic words 83% accuracy, with single repetition required. Improved accuracy to 100% with min-mod cues. Patient also wrote target words and provided description with rare min cues. Additional discrimination activity 90% accuracy, provided 2 definitions independently for each word. MOTOR SPEECH: Sentence level task with word-fill ins 80% accuracy; read sentences aloud 70% accuracy with mod cues to slow rate, clinician model for multisyllabic words.      Assessment / Recommendations / Plan   Plan Continue with current plan of care      Progression Toward Goals   Progression toward goals Progressing toward goals            SLP Education - 10/03/20 1303    Education Details slow rate, self-disclosure to reduce pressure in conversation, ask listener to stop him when they don't understand    Person(s) Educated Patient    Methods Explanation    Comprehension Verbalized understanding;Returned demonstration              SLP Long Term Goals - 10/01/20 0848      SLP LONG TERM GOAL #1   Title Patient will generate grammatical and cogent sentence to complete abstract/comple linguistic task with 80% accuracy.    Time 8  Period Weeks    Status Partially Met    Target Date 11/21/20      SLP LONG TERM GOAL #2   Title Patient will read aloud sentences and multi-syllabic words, maintaining phonemic accuracy, with 80% accuracy.      Time 8    Period Weeks    Status Partially Met    Target Date 11/21/20      SLP LONG TERM GOAL #3   Title Patient will write grammatical and cogent sentence to complete simple/basic linguistic task with 80% accuracy.    Time 8    Period Weeks    Status Partially Met    Target Date 11/21/20      SLP LONG TERM GOAL #4   Title Patient will complete multi-unit processing tasks with 80% accuracy without the need of repetition of task  instructions or significant delays in responding.    Time 8    Period Weeks    Status Partially Met    Target Date 11/21/20      SLP LONG TERM GOAL #5   Title Patient will demonstrate reading comprehension for paragraphs with 80% accuracy.    Time 8    Period Weeks    Status Partially Met    Target Date 11/21/20            Plan - 10/03/20 1305    Clinical Impression Statement Patient continues to demonstrate motivation for improvement and willingness to use compensations for aphasia/apraxia. Word level auditory discrimination continues to improve; patient able to write target stimuli at word level with rare min cues. Requires cues occasionally for error awareness in conversation as well as slowing rate to improve fluidity, reduce breakdowns. Patient would benefit from ongoing skilled ST intervention to maximize communication skills including reading, writing, and conversation to reduce social isolation, increase participation in the community, and to improve quality of life.    Speech Therapy Frequency 2x / week    Duration 12 weeks           Patient will benefit from skilled therapeutic intervention in order to improve the following deficits and impairments:   Aphasia    Problem List Patient Active Problem List   Diagnosis Date Noted  . Seizures ([place]) 05/21/2020  . Depression 01/16/2016  . Cephalalgia 06/03/2015  . Problems influencing health status 06/03/2015  . 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 Angiopathy 06/03/2015  . Completed stroke ([place]) 06/03/2015  . Hypercholesterolemia 06/03/2015   Deneise Lever, [place], CCC-SLP Speech-Language Pathologist  [name] 10/03/2020, 1:10 PM  [place] MAIN [facility] SERVICES [address] [place], [place], [address] Phone: [phone]   Fax:  [phone]   Name: Johnathan Campbell MRN: [id number] Date of Birth: [birth year]

## 2020-10-08 ENCOUNTER — Ambulatory Visit: Payer: PPO | Admitting: Speech Pathology

## 2020-10-08 ENCOUNTER — Other Ambulatory Visit: Payer: Self-pay

## 2020-10-08 DIAGNOSIS — R4701 Aphasia: Secondary | ICD-10-CM | POA: Diagnosis not present

## 2020-10-08 DIAGNOSIS — R41841 Cognitive communication deficit: Secondary | ICD-10-CM

## 2020-10-08 NOTE — Therapy (Signed)
[place] MAIN [facility] SERVICES [address] [place], [place], [address] Phone: [phone]   Fax:  [phone]  Speech Language Pathology Treatment  Patient Details  Name: Johnathan Campbell MRN: [id number] Date of Birth: 1961/10/01 Referring Provider (SLP): Dr. Manuella Ghazi   Encounter Date: 10/08/2020   End of Session - 10/08/20 1342    Visit Number 61    Number of Visits 4    Date for SLP Re-Evaluation 85/27/78   recert was completed on 11/22; date updated today to reflect new recert date   Authorization Type Medicare    Authorization Time Period 09/09/2020    Authorization - Visit Number 9    Authorization - Number of Visits 10    Progress Note Due on Visit 10    SLP Start Time 1002    SLP Stop Time  1055    SLP Time Calculation (min) 53 min    Activity Tolerance Patient tolerated treatment well           Past Medical History:  Diagnosis Date  . Acute intractable headache    unspecified headache type  . Allergy   . Aphasia S/P CVA   . Clot    blood clot  . Completed stroke ([place])   . Hypercholesterolemia     Past Surgical History:  Procedure Laterality Date  . HERNIA REPAIR  twice    There were no vitals filed for this visit.   Subjective Assessment - 10/08/20 1341    Subjective Arrives with iPad    Currently in Pain? No/denies                 ADULT SLP TREATMENT - 10/08/20 1106      General Information   Behavior/Cognition Alert;Cooperative;Pleasant mood    HPI Patient is a 59 year old male who suffered an ischemic left MCA infarct with involvement of both temporal and parietal lobes on 05/15/2013. Patient history is significant for aphasia and seizures. Patient received SLP services in 2017 and has returned because he says his speech is "still not where he wants it to be". Patient has been attending teletherapy sessions with the [facility] ([facility]) due to COVID-19, however, he is searching for individual  therapy because he wants more intensive treatment.       Treatment Provided   Treatment provided Cognitive-Linquistic      Pain Assessment   Pain Assessment No/denies pain      Cognitive-Linquistic Treatment   Treatment focused on Aphasia    Skilled Treatment MOTOR SPEECH/COMPREHENSION: Patient read 10 sentences in short article of choosing, occasional mod cues to slow rate. Repeated multisyllabic words 80% accuracy with mod cues when errors occurred. Answered comprehension questions 3/3. AUDITORY COMPREHENSION: Required mod cues for impulsivity in 2-step listening activity; 2/10 items correct on initial attempt, although pt began requesting repetition/written cues and slowed rate as task progressed. 80% accuracy with repeat, min question/written cues. Demo'd how pt can access similar activities on his tablet for home practice. AUDITORY DISCRIMINATION: Repeated monosyllabic words 80% accuracy on first attempt, 90% with repeat. When paired with image 100% accuracy. EXPRESSION: Complex sentence fill-ins 80% accuracy, occasional min cues.      Assessment / Recommendations / Plan   Plan Continue with current plan of care      Progression Toward Goals   Progression toward goals Progressing toward goals            SLP Education - 10/08/20 1341    Education Details tasks  for home to work on comprehension, slow rate, reduce impulsivity    Person(s) Educated Patient    Methods Explanation;Demonstration    Comprehension Verbalized understanding;Returned demonstration;Need further instruction              SLP Long Term Goals - 10/01/20 0848      SLP LONG TERM GOAL #1   Title Patient will generate grammatical and cogent sentence to complete abstract/comple linguistic task with 80% accuracy.    Time 8    Period Weeks    Status Partially Met    Target Date 11/21/20      SLP LONG TERM GOAL #2   Title Patient will read aloud sentences and multi-syllabic words, maintaining phonemic  accuracy, with 80% accuracy.      Time 8    Period Weeks    Status Partially Met    Target Date 11/21/20      SLP LONG TERM GOAL #3   Title Patient will write grammatical and cogent sentence to complete simple/basic linguistic task with 80% accuracy.    Time 8    Period Weeks    Status Partially Met    Target Date 11/21/20      SLP LONG TERM GOAL #4   Title Patient will complete multi-unit processing tasks with 80% accuracy without the need of repetition of task instructions or significant delays in responding.    Time 8    Period Weeks    Status Partially Met    Target Date 11/21/20      SLP LONG TERM GOAL #5   Title Patient will demonstrate reading comprehension for paragraphs with 80% accuracy.    Time 8    Period Weeks    Status Partially Met    Target Date 11/21/20            Plan - 10/08/20 1343    Clinical Impression Statement Patient continues to demonstrate motivation for improvement and willingness to use compensations for aphasia/apraxia. Requested repetition when necessary ~80% of the time in auditory comprehension/discrimination tasks today. Demonstrated understanding of need to slow rate to improve accuracy. Patient would benefit from ongoing skilled ST intervention to maximize communication skills including reading, writing, and conversation to reduce social isolation, increase participation in the community, and to improve quality of life.    Speech Therapy Frequency 2x / week    Duration 12 weeks           Patient will benefit from skilled therapeutic intervention in order to improve the following deficits and impairments:   Aphasia  Cognitive communication deficit    Problem List Patient Active Problem List   Diagnosis Date Noted  . Seizures ([place]) 05/21/2020  . Depression 01/16/2016  . Cephalalgia 06/03/2015  . Problems influencing health status 06/03/2015  . 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 Angiopathy  06/03/2015  . Completed stroke ([place]) 06/03/2015  . Hypercholesterolemia 06/03/2015   Deneise Lever, [place], CCC-SLP Speech-Language Pathologist   [name] 10/08/2020, 1:48 PM  [place] MAIN [facility] SERVICES [address] [place], [place], [address] Phone: [phone]   Fax:  [phone]   Name: LUIE LANEVE MRN: [id number] Date of Birth: Mar 03, 1961

## 2020-10-10 ENCOUNTER — Other Ambulatory Visit: Payer: Self-pay

## 2020-10-10 ENCOUNTER — Ambulatory Visit: Payer: PPO | Admitting: Speech Pathology

## 2020-10-10 DIAGNOSIS — R4701 Aphasia: Secondary | ICD-10-CM

## 2020-10-10 NOTE — Therapy (Addendum)
[place] MAIN [facility] SERVICES [address] [place], [place], [address] Phone: [phone]   Fax:  [phone]  Speech Language Pathology Treatment and Progress Note  Patient Details  Name: Johnathan Campbell MRN: [id number] Date of Birth: May 16, 1961 Referring Provider (SLP): Dr. Manuella Ghazi   Speech Therapy Progress Note  Dates of Reporting Period: 09/03/20 to 10/10/20  Patient has been seen for 10 speech therapy sessions this reporting period targeting expressive and receptive language. Patient has partially met 4/4 LTGs and continues to make steady progress.    Encounter Date: 10/10/2020   End of Session - 10/10/20 1212    Visit Number 61    Number of Visits 93    Date for SLP Re-Evaluation 11/21/20    Authorization Type Medicare    Authorization - Visit Number 10    Authorization - Number of Visits 10    Progress Note Due on Visit 10    SLP Start Time 1003    SLP Stop Time  1058    SLP Time Calculation (min) 55 min    Activity Tolerance Patient tolerated treatment well           Past Medical History:  Diagnosis Date  . Acute intractable headache    unspecified headache type  . Allergy   . Aphasia S/P CVA   . Clot    blood clot  . Completed stroke ([place])   . Hypercholesterolemia     Past Surgical History:  Procedure Laterality Date  . HERNIA REPAIR  twice    There were no vitals filed for this visit.   Subjective Assessment - 10/10/20 1210    Subjective "I'm always early."    Currently in Pain? No/denies                 ADULT SLP TREATMENT - 10/10/20 1210      General Information   Behavior/Cognition Alert;Cooperative;Pleasant mood    HPI Patient is a 59 year old male who suffered an ischemic left MCA infarct with involvement of both temporal and parietal lobes on 05/15/2013. Patient history is significant for aphasia and seizures. Patient received SLP services in 2017 and has returned because he says his speech is  "still not where he wants it to be". Patient has been attending teletherapy sessions with the [facility] ([facility] due to COVID-19, however, he is searching for individual therapy because he wants more intensive treatment.       Treatment Provided   Treatment provided Cognitive-Linquistic      Pain Assessment   Pain Assessment No/denies pain      Cognitive-Linquistic Treatment   Treatment focused on Aphasia    Skilled Treatment Patient did not have time to complete TalkPath activities at home. SLP facilitated discussion regarding pt's personal goals and how he would like to improve communication in his day-to-day life. Patient mentioned sometimes getting the details wrong or missing parts of stories. He notes that he makes errors when he sends/reads messages at times. He also has trouble comprehending and relaying numeric information, which frustrates him. SLP educated pt on need to request clarification if he realizes he misses a detail. Discussed using rephrasing to help his communication partner understand if he has not comprehended information correctly. Demonstrated use of accessibility features on pt's phone and discussed opportunities for pt to use these. Pt to complete email homework using accessibility features on his phone or tablet.      Assessment / Recommendations / Plan   Plan  Continue with current plan of care      Progression Toward Goals   Progression toward goals Progressing toward goals            SLP Education - 10/10/20 1211    Education Details text-to-speech to check his messages for errors, working on language in the context of a functional activity    Person(s) Educated Patient    Methods Explanation    Comprehension Verbalized understanding              SLP Long Term Goals - 10/10/20 [place] #1   Title Patient will generate grammatical and cogent sentence to complete abstract/comple linguistic task with 80% accuracy.     Time 8    Period Weeks    Status Partially Met      SLP LONG TERM GOAL #2   Title Patient will read aloud sentences and multi-syllabic words, maintaining phonemic accuracy, with 80% accuracy.      Time 8    Period Weeks    Status Partially Met      SLP LONG TERM GOAL #3   Title Patient will write grammatical and cogent sentence to complete simple/basic linguistic task with 80% accuracy.    Time 8    Period Weeks    Status Partially Met      SLP LONG TERM GOAL #4   Title Patient will complete multi-unit processing tasks with 80% accuracy without the need of repetition of task instructions or significant delays in responding.    Time 8    Period Weeks    Status Partially Met      SLP LONG TERM GOAL #5   Title Patient will demonstrate reading comprehension for paragraphs with 80% accuracy.    Time 8    Period Weeks    Status Partially Met            Plan - 10/10/20 1213    Clinical Impression Statement Patient generated personal goals such as comprehending details in a conversation, working on communication breakdowns that occur with numbers. Eager participant in brainstorming ways he can work to improve his deficits in the context of everyday communication. Patient agrees that he would benefit from using multimodal communication tools but needs more guidance in how to implement this consistently. Continue skilled ST to maximize communication skills including reading, writing, and conversation to reduce social isolation, increase participation in the community, and to improve quality of life.    Speech Therapy Frequency 2x / week    Duration 12 weeks           Patient will benefit from skilled therapeutic intervention in order to improve the following deficits and impairments:   Aphasia    Problem List Patient Active Problem List   Diagnosis Date Noted  . Seizures ([place]) 05/21/2020  . Depression 01/16/2016  . Cephalalgia 06/03/2015  . Problems influencing health status  06/03/2015  . 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 Angiopathy 06/03/2015  . Completed stroke ([place]) 06/03/2015  . Hypercholesterolemia 06/03/2015   Deneise Lever, [place], CCC-SLP Speech-Language Pathologist  [name] 10/10/2020, 12:57 PM  [place] MAIN [facility] SERVICES [address] [place], [place], [address] Phone: [phone]   Fax:  [phone]   Name: ORRIS PERIN MRN: [id number] Date of Birth: February 16, 1961

## 2020-10-15 ENCOUNTER — Other Ambulatory Visit: Payer: Self-pay

## 2020-10-15 ENCOUNTER — Ambulatory Visit: Payer: PPO | Admitting: Speech Pathology

## 2020-10-15 DIAGNOSIS — R4701 Aphasia: Secondary | ICD-10-CM

## 2020-10-15 NOTE — Therapy (Signed)
[facility] SERVICES [address] [place], [place], [address] Phone: [phone]   Fax:  [phone]  Speech Language Pathology Treatment  Patient Details  Name: Johnathan Campbell MRN: [id number] Date of Birth: 03/16/1961 Referring Provider (SLP): Dr. Shah   Encounter Date: 10/15/2020   End of Session - 10/15/20 1129    Visit Number 81    Number of Visits 82    Date for SLP Re-Evaluation 11/21/20    Authorization Type Medicare    Authorization Time Period 09/09/2020    Authorization - Visit Number 1    Authorization - Number of Visits 10    Progress Note Due on Visit 10    SLP Start Time 1000    SLP Stop Time  1055    SLP Time Calculation (min) 55 min    Activity Tolerance Patient tolerated treatment well           Past Medical History:  Diagnosis Date  . Acute intractable headache    unspecified headache type  . Allergy   . Aphasia S/P CVA   . Clot    blood clot  . Completed stroke ([facility])   . Hypercholesterolemia     Past Surgical History:  Procedure Laterality Date  . HERNIA REPAIR  twice    There were no vitals filed for this visit.   Subjective Assessment - 10/15/20 1122    Subjective "I was busy."    Currently in Pain? No/denies                 ADULT SLP TREATMENT - 10/15/20 1000      General Information   Behavior/Cognition Alert;Cooperative;Pleasant mood    HPI Patient is a 59-year-old male who suffered an ischemic left MCA infarct with involvement of both temporal and parietal lobes on 05/15/2013. Patient history is significant for aphasia and seizures. Patient received SLP services in 2017 and has returned because he says his speech is "still not where he wants it to be". Patient has been attending teletherapy sessions with the [facility] ([facility]) due to COVID-19, however, he is searching for individual therapy because he wants more intensive treatment.       Treatment Provided    Treatment provided Cognitive-Linquistic      Pain Assessment   Pain Assessment No/denies pain      Cognitive-Linquistic Treatment   Treatment focused on Aphasia    Skilled Treatment Monish did not have time to reply to email HW; he was tasked with completing this week. Targeted expressive language, error awareness, and writing skills in structured task using VNEST; pt generated sentences 8-10 words 80% accuracy, 90% with min cues for double checking. For remaining errors in syntax or with choice of function words, pt required moderate cues for correction. AUDITORY COMPREHENSION: For functional scenarios (choosing movie times), pt missed >75% of details on first presentation. He initiated request for repetition when necessary ~70% of the time. Written and question cues necessary for pt to ID relevant details, and usually 3-4 repetitions of content. Encouraged pt to practice similar activities on TalkPath at home.      Assessment / Recommendations / Plan   Plan Continue with current plan of care      Progression Toward Goals   Progression toward goals Progressing toward goals            SLP Education - 10/15/20 1128    Education Details appropriate activities to practice listening to details      Person(s) Educated Patient    Methods Explanation    Comprehension Verbalized understanding              SLP Long Term Goals - 10/10/20 1256      SLP LONG TERM GOAL #1   Title Patient will generate grammatical and cogent sentence to complete abstract/comple linguistic task with 80% accuracy.    Time 8    Period Weeks    Status Partially Met      SLP LONG TERM GOAL #2   Title Patient will read aloud sentences and multi-syllabic words, maintaining phonemic accuracy, with 80% accuracy.      Time 8    Period Weeks    Status Partially Met      SLP LONG TERM GOAL #3   Title Patient will write grammatical and cogent sentence to complete simple/basic linguistic task with 80% accuracy.     Time 8    Period Weeks    Status Partially Met      SLP LONG TERM GOAL #4   Title Patient will complete multi-unit processing tasks with 80% accuracy without the need of repetition of task instructions or significant delays in responding.    Time 8    Period Weeks    Status Partially Met      SLP LONG TERM GOAL #5   Title Patient will demonstrate reading comprehension for paragraphs with 80% accuracy.    Time 8    Period Weeks    Status Partially Met            Plan - 10/15/20 1129    Clinical Impression Statement Remo demonstrated improved awareness by initiating repeat requests in structured activity 70% of the time. Continue skilled ST for carryover of multimodal tools and strategies to maximize communication skills.    Speech Therapy Frequency 2x / week    Duration 12 weeks           Patient will benefit from skilled therapeutic intervention in order to improve the following deficits and impairments:   Aphasia    Problem List Patient Active Problem List   Diagnosis Date Noted  . Seizures ([facility]) 05/21/2020  . Depression 01/16/2016  . Cephalalgia 06/03/2015  . Problems influencing health status 06/03/2015  . 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 Angiopathy 06/03/2015  . Completed stroke ([facility]) 06/03/2015  . Hypercholesterolemia 06/03/2015   Mary Beth Bardin, MS, CCC-SLP Speech-Language Pathologist   Mary E Bardin 10/15/2020, 11:31 AM    [facility] [address] [place], [place], [address] Phone: [phone]   Fax:  [phone]   Name: Johnathan Campbell MRN: [id number] Date of Birth: 09/04/1961 

## 2020-10-17 ENCOUNTER — Ambulatory Visit: Payer: PPO | Admitting: Speech Pathology

## 2020-10-17 ENCOUNTER — Other Ambulatory Visit: Payer: Self-pay

## 2020-10-17 DIAGNOSIS — R4701 Aphasia: Secondary | ICD-10-CM | POA: Diagnosis not present

## 2020-10-17 NOTE — Therapy (Signed)
[place] MAIN [facility] SERVICES [address] [place], [place], [address] Phone: [phone]   Fax:  [phone]  Speech Language Pathology Treatment/ Recertification  Patient Details  Name: GREGOR DERSHEM MRN: [id number] Date of Birth: 13-Dec-1960 Referring Provider (SLP): Dr. Manuella Ghazi   Encounter Date: 10/17/2020   End of Session - 10/17/20 1114    Visit Number 82    Number of Visits 95    Date for SLP Re-Evaluation 11/28/20    Authorization Type Medicare    Authorization Time Period 09/09/2020    Authorization - Visit Number 2    Authorization - Number of Visits 10    Progress Note Due on Visit 10    SLP Start Time 0955    SLP Stop Time  1050    SLP Time Calculation (min) 55 min    Activity Tolerance Patient tolerated treatment well           Past Medical History:  Diagnosis Date  . Acute intractable headache    unspecified headache type  . Allergy   . Aphasia S/P CVA   . Clot    blood clot  . Completed stroke ([place])   . Hypercholesterolemia     Past Surgical History:  Procedure Laterality Date  . HERNIA REPAIR  twice    There were no vitals filed for this visit.   Subjective Assessment - 10/17/20 1100    Subjective "Restaurants are frustrating."    Currently in Pain? 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 Patient history is significant for aphasia and seizures. Patient received SLP services in 2017 and has returned because he says his speech is "still not where he wants it to be". Patient has been attending teletherapy sessions with the [facility] ([facility]) due to COVID-19, however, he is searching for individual therapy because he wants more intensive treatment.        Treatment Provided   Treatment provided Cognitive-Linquistic      Pain Assessment   Pain Assessment No/denies pain      Cognitive-Linquistic Treatment   Treatment focused on Aphasia    Skilled Treatment Haskell reported frustration with restaurants today; he has trouble comprehending specials, sometimes misses details about what he is ordering, or has difficulty requesting what he would like. Today we worked on using strategies to reduce breakdowns in each of these scenarios. For auditory comprehension in role play (SLP telling daily specials), patient initiated request for repeat independently, but even with repetition comprehended only ~30% of details for each special. He required min-mod cues to request additional compensation, such as SLP writing the specials for him, or showing him the special board. SLP demo'd use of speech-to-text in notes app on pt's phone, which improved comprehension to 100%. We also discussed how he might introduce his need to use such compensations to create expectations for his communication partner. We generated strategies such as reviewing the menu prior to arriving at the restaurant, to allow him more time to read and comprehend choices. For expressive language, SLP had pt make menu requests, and use description/circumloction to describe menu items when he was unable to pronounce the name; he required min-mod cues  for this.      Assessment / Recommendations / Plan   Plan Goals updated      Progression Toward Goals   Progression toward goals Progressing toward goals            SLP Education - 10/17/20 1114    Education Details speech-to-text to aid comprehension    Person(s) Educated Patient    Methods Explanation;Demonstration    Comprehension Verbalized understanding;Returned demonstration;Need further instruction            SLP Short Term Goals - 10/17/20 1122      SLP SHORT TERM GOAL #1   Title Patient will identify 3 barriers/opportunities to  use multimodal communication in his daily life.    Time 3    Period Weeks    Status New    Target Date 11/07/20      SLP SHORT TERM GOAL #2   Title Patient will report use of speech-to-text outside of ST to assist with comprehension in real-life communication scenario.    Time 3    Period Weeks    Status New    Target Date 11/07/20            SLP Long Term Goals - 10/17/20 1121      SLP LONG TERM GOAL #1   Title Patient will generate grammatical and cogent sentence to complete abstract/comple linguistic task with 80% accuracy.    Time 8    Period Weeks    Status Partially Met      SLP LONG TERM GOAL #2   Title Patient will read aloud sentences and multi-syllabic words, maintaining phonemic accuracy, with 80% accuracy.      Time 8    Period Weeks    Status Partially Met      SLP LONG TERM GOAL #3   Title Patient will write grammatical and cogent sentence to complete simple/basic linguistic task with 80% accuracy.    Time 8    Period Weeks    Status Partially Met      SLP LONG TERM GOAL #4   Title Patient will complete multi-unit processing tasks with 80% accuracy without the need of repetition of task instructions or significant delays in responding, with use of compensations/assistive technology if needed.    Time 8    Period Weeks    Status Revised      SLP LONG TERM GOAL #5   Title Patient will demonstrate reading comprehension for paragraphs with 80% accuracy.    Time 8    Period Weeks    Status Partially Met            Plan - 10/17/20 1116    Clinical Impression Statement Kingjames is receptive to and shows great interest in learning strategies to aid his expression and auditory comprehension in daily challenges/frustrations. Reading comprehension and writing are relative strengthens which have great potential to assist him when using tools appropriately. He requires ongoing skilled ST for training in use of multimodal communication strategies, as well as  identifying barriers and opportunities to use strategies in his daily life. Short and LTGs updated today.    Speech Therapy Frequency 2x / week    Duration 12 weeks           Patient will benefit from skilled therapeutic intervention in order to improve the following deficits and impairments:   Aphasia    Problem List Patient Active Problem List   Diagnosis Date Noted  . Seizures ([place]) 05/21/2020  .  Depression 01/16/2016  . Cephalalgia 06/03/2015  . Problems influencing health status 06/03/2015  . 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 Angiopathy 06/03/2015  . Completed stroke ([place]) 06/03/2015  . Hypercholesterolemia 06/03/2015   Deneise Lever, [place], CCC-SLP Speech-Language Pathologist  [name] 10/17/2020, 11:28 AM  [place] MAIN [facility] SERVICES [address] [place], [place], [address] Phone: [phone]   Fax:  [phone]   Name: SIRE POET MRN: [id number] Date of Birth: [birth year]

## 2020-10-22 ENCOUNTER — Other Ambulatory Visit: Payer: Self-pay

## 2020-10-22 ENCOUNTER — Ambulatory Visit: Payer: PPO | Admitting: Speech Pathology

## 2020-10-22 DIAGNOSIS — R4701 Aphasia: Secondary | ICD-10-CM | POA: Diagnosis not present

## 2020-10-22 NOTE — Therapy (Signed)
[place] MAIN [facility] SERVICES [address] [place], [place], [address] Phone: [phone]   Fax:  [phone]  Speech Language Pathology Treatment  Patient Details  Name: Johnathan Campbell MRN: [id number] Date of Birth: 1960/11/12 Referring Provider (SLP): Dr. Manuella Ghazi   Encounter Date: 10/22/2020   End of Session - 10/22/20 1244    Visit Number 35    Number of Visits 95    Date for SLP Re-Evaluation 11/28/20    Authorization Type Medicare    Authorization Time Period 09/09/2020    Authorization - Visit Number 3    Authorization - Number of Visits 10    Progress Note Due on Visit 10    SLP Start Time 0955    SLP Stop Time  1050    SLP Time Calculation (min) 55 min    Activity Tolerance Patient tolerated treatment well           Past Medical History:  Diagnosis Date  . Acute intractable headache    unspecified headache type  . Allergy   . Aphasia S/P CVA   . Clot    blood clot  . Completed stroke ([place])   . Hypercholesterolemia     Past Surgical History:  Procedure Laterality Date  . HERNIA REPAIR  twice    There were no vitals filed for this visit.   Subjective Assessment - 10/22/20 1235    Subjective Pt sent email for homework    Currently in Pain? 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 Patient history is significant for aphasia and seizures. Patient received SLP services in 2017 and has returned because he says his speech is "still not where he wants it to be". Patient has been attending teletherapy sessions with the [facility] (TAP) due to COVID-19, however, he is searching for individual therapy because he wants more intensive treatment.       Treatment  Provided   Treatment provided Cognitive-Linquistic      Pain Assessment   Pain Assessment No/denies pain      Cognitive-Linquistic Treatment   Treatment focused on Aphasia    Skilled Treatment Johnathan Campbell went to a restaurant over the weekend and reported reviewing the menu online prior to his outing to help him make a faster choice. Targeted awareness, use of compensations for listening in functional activity; pt listened to 2-3 sentence paragraphs with information re: appointments or shopping lists. Answered Y/N questions re: details 60% accuracy. Pt then used speech-to-text on his phone and this improved accuracy to 100%.  Pt reports particular frustration with listening to numbers and addresses. Addresses <50% accuracy, even when taking notes and requesting repetition. When using compensation of speech-to-text on his phone, this improved to 100%. Patient acknowledged that he would benefit from using this in conversations, as well. Pt mentioned possibility of reducing frequency to 1x per week; we decided to discuss this next session.      Assessment / Recommendations / Plan   Plan Continue with current plan of care      Progression Toward Goals   Progression toward goals Progressing toward goals            SLP  Education - 10/22/20 1244    Education Details opportunities/scenarios to use speech-to-text; how to share this with communication partner    Person(s) Educated Patient    Methods Explanation    Comprehension Verbalized understanding            SLP Short Term Goals - 10/17/20 1122      SLP SHORT TERM GOAL #1   Title Patient will identify 3 barriers/opportunities to use multimodal communication in his daily life.    Time 3    Period Weeks    Status New    Target Date 11/07/20      SLP SHORT TERM GOAL #2   Title Patient will report use of speech-to-text outside of ST to assist with comprehension in real-life communication scenario.    Time 3    Period Weeks    Status New     Target Date 11/07/20            SLP Long Term Goals - 10/17/20 1121      SLP LONG TERM GOAL #1   Title Patient will generate grammatical and cogent sentence to complete abstract/comple linguistic task with 80% accuracy.    Time 8    Period Weeks    Status Partially Met      SLP LONG TERM GOAL #2   Title Patient will read aloud sentences and multi-syllabic words, maintaining phonemic accuracy, with 80% accuracy.      Time 8    Period Weeks    Status Partially Met      SLP LONG TERM GOAL #3   Title Patient will write grammatical and cogent sentence to complete simple/basic linguistic task with 80% accuracy.    Time 8    Period Weeks    Status Partially Met      SLP LONG TERM GOAL #4   Title Patient will complete multi-unit processing tasks with 80% accuracy without the need of repetition of task instructions or significant delays in responding, with use of compensations/assistive technology if needed.    Time 8    Period Weeks    Status Revised      SLP LONG TERM GOAL #5   Title Patient will demonstrate reading comprehension for paragraphs with 80% accuracy.    Time 8    Period Weeks    Status Partially Met            Plan - 10/22/20 1244    Clinical Impression Statement Johnathan Campbell was pleased with how his accuracy improved with speech-to-text feature for recall/comprehension of functional information. He demonstrates understanding of how tools and strategies may benefit him, but requires ongoing training and encouragement to identify and maximize these opportunities in his daily life.    Speech Therapy Frequency 2x / week    Duration 12 weeks           Patient will benefit from skilled therapeutic intervention in order to improve the following deficits and impairments:   Aphasia    Problem List Patient Active Problem List   Diagnosis Date Noted  . Seizures ([place]) 05/21/2020  . Depression 01/16/2016  . Cephalalgia 06/03/2015  . Problems influencing health  status 06/03/2015  . 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 Angiopathy 06/03/2015  . Completed stroke ([place]) 06/03/2015  . Hypercholesterolemia 06/03/2015   Deneise Lever, [place], CCC-SLP Speech-Language Pathologist  [name] 10/22/2020, 12:47 PM  [place] MAIN [facility] SERVICES [place],  [place], [address] Phone: [phone]   Fax:  [phone]   Name: Johnathan Campbell MRN: [id number] Date of Birth: August 17, 1961

## 2020-10-23 DIAGNOSIS — I69398 Other sequelae of cerebral infarction: Secondary | ICD-10-CM | POA: Diagnosis not present

## 2020-10-23 DIAGNOSIS — G40909 Epilepsy, unspecified, not intractable, without status epilepticus: Secondary | ICD-10-CM | POA: Diagnosis not present

## 2020-10-23 DIAGNOSIS — I69320 Aphasia following cerebral infarction: Secondary | ICD-10-CM | POA: Diagnosis not present

## 2020-10-24 ENCOUNTER — Ambulatory Visit: Payer: PPO | Admitting: Speech Pathology

## 2020-10-29 ENCOUNTER — Other Ambulatory Visit: Payer: Self-pay

## 2020-10-29 ENCOUNTER — Ambulatory Visit: Payer: PPO | Admitting: Speech Pathology

## 2020-10-29 DIAGNOSIS — R4701 Aphasia: Secondary | ICD-10-CM

## 2020-10-29 NOTE — Therapy (Signed)
[place] MAIN [facility] SERVICES [address] [place], [place], [address] Phone: [phone]   Fax:  [phone]  Speech Language Pathology Treatment  Patient Details  Name: Johnathan Campbell MRN: [id number] Date of Birth: [birth year] Referring Provider (SLP): Dr. Manuella Ghazi   Encounter Date: 10/29/2020   End of Session - 10/29/20 1057    Visit Number 49    Number of Visits 95    Date for SLP Re-Evaluation 11/28/20    Authorization Type Medicare    Authorization Time Period 09/09/2020    Authorization - Visit Number 4    Progress Note Due on Visit 10    SLP Start Time 1002    SLP Stop Time  1052    SLP Time Calculation (min) 50 min    Activity Tolerance Patient tolerated treatment well           Past Medical History:  Diagnosis Date  . Acute intractable headache    unspecified headache type  . Allergy   . Aphasia S/P CVA   . Clot    blood clot  . Completed stroke ([place])   . Hypercholesterolemia     Past Surgical History:  Procedure Laterality Date  . HERNIA REPAIR  twice    There were no vitals filed for this visit.   Subjective Assessment - 10/29/20 1052    Subjective Patient would like to decrease freq to 1x per week for ST    Currently in Pain? No/denies                 ADULT SLP TREATMENT - 10/29/20 1052      General Information   Behavior/Cognition Alert;Cooperative;Pleasant mood    HPI Patient is a 59 year old male who suffered an ischemic left MCA infarct with involvement of both temporal and parietal lobes on 05/15/2013. Patient history is significant for aphasia and seizures. Patient received SLP services in 2017 and has returned because he says his speech is "still not where he wants it to be". Patient has been attending teletherapy sessions with the [facility] (T[facility]) due to COVID-19, however, he is searching for individual therapy because he wants more intensive treatment.       Treatment Provided    Treatment provided Cognitive-Linquistic      Pain Assessment   Pain Assessment No/denies pain      Cognitive-Linquistic Treatment   Treatment focused on Aphasia    Skilled Treatment Patient seen for skilled ST treatment targeting aphasia. Reported a good holiday; he had some conversations with his girlfriend's family and admitted he likely missed some details; acknowledged he does not feel completely comfortable using speech-to-text outside of [place] room. Pt used this with SLP cues in session today to comprehend details from mod complex- complex conversation. Used written/visual cues to discuss upcoming schedule changes and pt kept this paper to help him communicate information to schedulers. Pt homework is to use speech-to-text outside of ST at least 1x before next session. Encouraged him to try with a comfortable communication partner first, such as his girlfriend.      Assessment / Recommendations / Plan   Plan Continue with current plan of care   maintain current goals; will decrease freq to 1x wk     Progression Toward Goals   Progression toward goals Progressing toward goals            SLP Education - 10/29/20 1057    Education Details practice use of strategies/tools with someone he's comfortable  with    Person(s) Educated Patient    Methods Explanation    Comprehension Verbalized understanding            SLP Short Term Goals - 10/17/20 1122      SLP SHORT TERM GOAL #1   Title Patient will identify 3 barriers/opportunities to use multimodal communication in his daily life.    Time 3    Period Weeks    Status New    Target Date 11/07/20      SLP SHORT TERM GOAL #2   Title Patient will report use of speech-to-text outside of ST to assist with comprehension in real-life communication scenario.    Time 3    Period Weeks    Status New    Target Date 11/07/20            SLP Long Term Goals - 10/17/20 1121      SLP LONG TERM GOAL #1   Title Patient will generate  grammatical and cogent sentence to complete abstract/comple linguistic task with 80% accuracy.    Time 8    Period Weeks    Status Partially Met      SLP LONG TERM GOAL #2   Title Patient will read aloud sentences and multi-syllabic words, maintaining phonemic accuracy, with 80% accuracy.      Time 8    Period Weeks    Status Partially Met      SLP LONG TERM GOAL #3   Title Patient will write grammatical and cogent sentence to complete simple/basic linguistic task with 80% accuracy.    Time 8    Period Weeks    Status Partially Met      SLP LONG TERM GOAL #4   Title Patient will complete multi-unit processing tasks with 80% accuracy without the need of repetition of task instructions or significant delays in responding, with use of compensations/assistive technology if needed.    Time 8    Period Weeks    Status Revised      SLP LONG TERM GOAL #5   Title Patient will demonstrate reading comprehension for paragraphs with 80% accuracy.    Time 8    Period Weeks    Status Partially Met            Plan - 10/29/20 1058    Clinical Impression Statement Johnathan Campbell is in agreement to decrease frequency to 1x per week. He would like to focus on use of strategies and external aids/technology to assist him with comprehension in conversations, as well as verbal fluency in conversations. Continue skilled ST to assist with carryover in use of strategies outside of ST.    Speech Therapy Frequency 2x / week    Duration 12 weeks           Patient will benefit from skilled therapeutic intervention in order to improve the following deficits and impairments:   Aphasia    Problem List Patient Active Problem List   Diagnosis Date Noted  . Seizures ([place]) 05/21/2020  . Depression 01/16/2016  . Cephalalgia 06/03/2015  . Problems influencing health status 06/03/2015  . 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 Angiopathy 06/03/2015  . Completed stroke ([place])  06/03/2015  . Hypercholesterolemia 06/03/2015   Deneise Lever, [place], CCC-SLP Speech-Language Pathologist  [name] 10/29/2020, 11:00 AM  [place] MAIN [facility] SERVICES [address] [place], [place], [address] Phone: [phone]   Fax:  [phone]  Name: Johnathan Campbell MRN: [id number] Date of Birth: November 11, 1960

## 2020-10-31 ENCOUNTER — Ambulatory Visit: Payer: PPO | Admitting: Speech Pathology

## 2020-11-05 ENCOUNTER — Ambulatory Visit: Payer: PPO | Attending: Neurology | Admitting: Speech Pathology

## 2020-11-05 DIAGNOSIS — I69920 Aphasia following unspecified cerebrovascular disease: Secondary | ICD-10-CM | POA: Insufficient documentation

## 2020-11-05 DIAGNOSIS — R4701 Aphasia: Secondary | ICD-10-CM | POA: Insufficient documentation

## 2020-11-07 ENCOUNTER — Ambulatory Visit: Payer: PPO | Admitting: Speech Pathology

## 2020-11-07 DIAGNOSIS — R110 Nausea: Secondary | ICD-10-CM | POA: Diagnosis not present

## 2020-11-07 DIAGNOSIS — R519 Headache, unspecified: Secondary | ICD-10-CM | POA: Diagnosis not present

## 2020-11-07 DIAGNOSIS — Z1159 Encounter for screening for other viral diseases: Secondary | ICD-10-CM | POA: Diagnosis not present

## 2020-11-12 ENCOUNTER — Ambulatory Visit: Payer: PPO | Admitting: Speech Pathology

## 2020-11-12 ENCOUNTER — Other Ambulatory Visit: Payer: Self-pay

## 2020-11-12 DIAGNOSIS — R4701 Aphasia: Secondary | ICD-10-CM | POA: Diagnosis not present

## 2020-11-12 DIAGNOSIS — I69920 Aphasia following unspecified cerebrovascular disease: Secondary | ICD-10-CM | POA: Diagnosis not present

## 2020-11-12 NOTE — Therapy (Signed)
[place] MAIN [facility] SERVICES [address] [place], [place], [address] Phone: [phone]   Fax:  [phone]  Speech Language Pathology Treatment  Patient Details  Name: Johnathan Campbell MRN: [id number] Date of Birth: 08-09-1961 Referring Provider (SLP): Dr. Manuella Ghazi   Encounter Date: 11/12/2020   End of Session - 11/12/20 1639    Visit Number 21    Number of Visits 95    Date for SLP Re-Evaluation 11/28/20    Authorization Type Medicare    Authorization Time Period 09/09/2020    Authorization - Visit Number 5    Progress Note Due on Visit 10    SLP Start Time 1002    SLP Stop Time  1055    SLP Time Calculation (min) 53 min    Activity Tolerance Patient tolerated treatment well           Past Medical History:  Diagnosis Date  . Acute intractable headache    unspecified headache type  . Allergy   . Aphasia S/P CVA   . Clot    blood clot  . Completed stroke ([place])   . Hypercholesterolemia     Past Surgical History:  Procedure Laterality Date  . HERNIA REPAIR  twice    There were no vitals filed for this visit.   Subjective Assessment - 11/12/20 1055    Subjective "The numbers are frustrating."    Currently in Pain? 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 Patient history is significant for aphasia and seizures. Patient received SLP services in 2017 and has returned because he says his speech is "still not where he wants it to be". Patient has been attending teletherapy sessions with the [facility] ([facility]) due to COVID-19, however, he is searching for individual therapy because he wants more intensive treatment.       Treatment Provided   Treatment provided  Cognitive-Linquistic      Pain Assessment   Pain Assessment No/denies pain      Cognitive-Linquistic Treatment   Treatment focused on Aphasia    Skilled Treatment Patient missed last week due to possible Covid infection; ultimately tested negative. Requesting today to work on auditory comprehension of numbers. Uploaded activities appropriate for patient to [employer] account. Accuracy was initially 0%, trained pt to use multiple repetitions, write what he heard, and recheck, and accuracy improved to 95% for 3 digits. For following 2 step, 2 component instructions, accuracy initially 0%, improved to 63% With multiple repetitions, and with SLP telling pt to focus on one component at a time. Reviewed what he learned in this exercise and how he might implement this in a conversation; educated on asking his speaker to break up information into smaller pieces, and repeating back information he does understand so that his speaker can clarify any miscommunications.      Assessment / Recommendations / Plan   Plan Continue with current plan of care      Progression Toward Goals   Progression toward goals Progressing toward goals            SLP Education - 11/12/20 1638    Education Details speaker modifications for auditory comprehension  Person(s) Educated Patient    Methods Explanation;Demonstration    Comprehension Verbalized understanding;Verbal cues required;Need further instruction            SLP Short Term Goals - 10/17/20 1122      SLP [place] #1   Title Patient will identify 3 barriers/opportunities to use multimodal communication in his daily life.    Time 3    Period Weeks    Status New    Target Date 11/07/20      SLP SHORT TERM GOAL #2   Title Patient will report use of speech-to-text outside of ST to assist with comprehension in real-life communication scenario.    Time 3    Period Weeks    Status New    Target Date 11/07/20            SLP Long  Term Goals - 10/17/20 1121      SLP LONG TERM GOAL #1   Title Patient will generate grammatical and cogent sentence to complete abstract/comple linguistic task with 80% accuracy.    Time 8    Period Weeks    Status Partially Met      SLP LONG TERM GOAL #2   Title Patient will read aloud sentences and multi-syllabic words, maintaining phonemic accuracy, with 80% accuracy.      Time 8    Period Weeks    Status Partially Met      SLP LONG TERM GOAL #3   Title Patient will write grammatical and cogent sentence to complete simple/basic linguistic task with 80% accuracy.    Time 8    Period Weeks    Status Partially Met      SLP LONG TERM GOAL #4   Title Patient will complete multi-unit processing tasks with 80% accuracy without the need of repetition of task instructions or significant delays in responding, with use of compensations/assistive technology if needed.    Time 8    Period Weeks    Status Revised      SLP LONG TERM GOAL #5   Title Patient will demonstrate reading comprehension for paragraphs with 80% accuracy.    Time 8    Period Weeks    Status Partially Met            Plan - 11/12/20 1639    Clinical Impression Statement Johnathan Campbell remains motivated for improvements and is receptive to using strategies to aid his comprehension and verbal expression. Continues to express frustration about numbers in particular; benefitted from repetition and writing to aid his understanding. Continue skilled ST to assist with carryover in use of strategies outside of ST.    Speech Therapy Frequency 2x / week    Duration 12 weeks           Patient will benefit from skilled therapeutic intervention in order to improve the following deficits and impairments:   Aphasia    Problem List Patient Active Problem List   Diagnosis Date Noted  . Seizures ([place]) 05/21/2020  . Depression 01/16/2016  . Cephalalgia 06/03/2015  . Problems influencing health status 06/03/2015  . Aphasia due to  late effects of cerebrovascular disease 06/03/2015  . Angiopathy 06/03/2015  . Completed stroke ([place]) 06/03/2015  . Hypercholesterolemia 06/03/2015   Deneise Lever, [place], CCC-SLP Speech-Language Pathologist  [name] 11/12/2020, 4:41 PM  [place] MAIN [facility] SERVICES [address] [place], [place], [address] Phone: [phone]   Fax:  [phone]   Name: Johnathan  RAJIV Campbell MRN: [id number] Date of Birth: 17-Jun-1961

## 2020-11-14 ENCOUNTER — Ambulatory Visit: Payer: PPO | Admitting: Speech Pathology

## 2020-11-19 ENCOUNTER — Ambulatory Visit: Payer: PPO | Admitting: Speech Pathology

## 2020-11-20 NOTE — Patient Instructions (Signed)

## 2020-11-20 NOTE — Progress Notes (Signed)
Established patient visit   Patient: Johnathan Campbell   DOB: 02-03-1961   60 y.o. Male  MRN: [id number] Visit Date: 11/21/2020  Today's healthcare provider: Wilhemena Durie, MD   Chief Complaint  Patient presents with  . Hypertension  . Depression  . Labs Only    Patient requesting STD testing, reports new partner.   Subjective    HPI HPI    Labs Only    Comments: Patient requesting STD testing, reports new partner.       Last edited by Dorian Pod, CMA on 11/21/2020  8:15 AM. (History)      Hypertension, follow-up  BP Readings from Last 3 Encounters:  11/21/20 128/86  05/21/20 111/76  03/19/20 (!) 146/91   Wt Readings from Last 3 Encounters:  11/21/20 184 lb (83.5 kg)  05/21/20 178 lb (80.7 kg)  03/19/20 175 lb 6.4 oz (79.6 kg)     He was last seen for hypertension 6 months ago.  BP at that visit was 111/76. Management since that visit includes; Improved on losartan. He reports excellent compliance with treatment. He is not having side effects.  He is not exercising. He is not adherent to low salt diet.   Outside blood pressures are not being checked.  He does not smoke.  Use of agents associated with hypertension: none.   --------------------------------------------------------------------------------------------------- Depression, Follow-up  He  was last seen for this 6 months ago. Changes made at last visit include; On venlafaxine.   He reports excellent compliance with treatment. He is not having side effects.   He reports excellent tolerance of treatment. Current symptoms include: patient denies any symptoms He feels he is Improved since last visit. Patient reports he is trying to taper of medication. He reports neurologist agrees with plan.  Depression screen [facility] 2/9 11/21/2020 05/23/2020 03/19/2020  Decreased Interest 0 0 0  Down, Depressed, Hopeless 0 0 0  PHQ - 2 Score 0 0 0  Altered sleeping 0 - 2  Tired, decreased energy  0 - 0  Change in appetite 0 - 0  Feeling bad or failure about yourself  0 - 0  Trouble concentrating 0 - 0  Moving slowly or fidgety/restless 0 - 0  Suicidal thoughts 0 - 0  PHQ-9 Score 0 - 2  Difficult doing work/chores Not difficult at all - Not difficult at all    -----------------------------------------------------------------------------------------  Patient Active Problem List   Diagnosis Date Noted  . Seizures ([place]) 05/21/2020  . Depression 01/16/2016  . Cephalalgia 06/03/2015  . Problems influencing health status 06/03/2015  . 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 Angiopathy 06/03/2015  . Completed stroke ([place]) 06/03/2015  . Hypercholesterolemia 06/03/2015   Social History   Tobacco Use  . Smoking status: Never Smoker  . Smokeless tobacco: Never Used  Vaping Use  . Vaping Use: Never used  Substance Use Topics  . Alcohol use: Yes    Comment: socially- 1/2 drinks at a time  . Drug use: No   No Known Allergies     Medications: Outpatient Medications Prior to Visit  Medication Sig  . aspirin EC 81 MG tablet Take 81 mg by mouth. Take every other day  . atorvastatin (LIPITOR) 80 MG tablet TAKE 1 TABLET BY MOUTH EVERYDAY AT BEDTIME  . losartan (COZAAR) 50 MG tablet Take 1 tablet (50 mg total) by mouth daily.  . naproxen (NAPROXEN DR) 500 MG EC tablet Take 1 tablet (500  mg total) by mouth 2 (two) times daily as needed.  . venlafaxine (EFFEXOR) 75 MG tablet TAKE 1 TABLET BY MOUTH TWICE A DAY  . [DISCONTINUED] hydrocortisone (PROCTOZONE-HC) 2.5 % rectal cream Place 1 application rectally 2 (two) times daily. (Patient not taking: Reported on 05/23/2020)  . [DISCONTINUED] lamoTRIgine (LAMICTAL) 150 MG tablet Take 150 mg by mouth 2 (two) times daily.   . [DISCONTINUED] lamoTRIgine (LAMICTAL) 25 MG tablet 75 mg 2 (two) times daily.  (Patient not taking: Reported on 05/21/2020)  . [DISCONTINUED] levETIRAcetam (KEPPRA) 500 MG tablet Take 1 tablet  (500 mg total) by mouth 2 (two) times daily. (Patient not taking: Reported on 05/23/2020)  . [DISCONTINUED] montelukast (SINGULAIR) 10 MG tablet TAKE 1 TABLET BY MOUTH EVERYDAY AT BEDTIME   No facility-administered medications prior to visit.    Review of Systems  Constitutional: Negative for appetite change, chills and fever.  Respiratory: Negative for chest tightness, shortness of breath and wheezing.   Cardiovascular: Negative for chest pain and palpitations.  Gastrointestinal: Negative for abdominal pain, nausea and vomiting.    Last CBC Lab Results  Component Value Date   WBC 6.8 03/22/2020   HGB 15.2 03/22/2020   HCT 45.5 03/22/2020   MCV 94 03/22/2020   MCH 31.5 03/22/2020   RDW 12.3 03/22/2020   PLT 201 03/22/2020       Objective    BP 128/86 (BP Location: Left Arm, Patient Position: Sitting, Cuff Size: Normal)   Pulse 77   Temp 98.4 F (36.9 C) (Oral)   Resp 16   Ht 5\' 10"  (1.778 m)   Wt 184 lb (83.5 kg)   SpO2 100%   BMI 26.40 kg/m  BP Readings from Last 3 Encounters:  11/21/20 128/86  05/21/20 111/76  03/19/20 (!) 146/91   Wt Readings from Last 3 Encounters:  11/21/20 184 lb (83.5 kg)  05/21/20 178 lb (80.7 kg)  03/19/20 175 lb 6.4 oz (79.6 kg)       Physical Exam Vitals and nursing note reviewed.  Constitutional:      Appearance: He is well-developed.  HENT:     Head: Normocephalic and atraumatic.  Eyes:     General: No scleral icterus. Cardiovascular:     Rate and Rhythm: Normal rate and regular rhythm.     Heart sounds: Normal heart sounds.  Pulmonary:     Effort: Pulmonary effort is normal.     Breath sounds: Normal breath sounds.  Musculoskeletal:     Cervical back: Normal range of motion and neck supple.  Skin:    General: Skin is warm and dry.  Neurological:     Mental Status: He is alert and oriented to person, place, and time. Mental status is at baseline.  Psychiatric:        Mood and Affect: Mood normal.        Behavior:  Behavior normal.        Thought Content: Thought content normal.        Judgment: Judgment normal.       No results found for any visits on 11/21/20.  Assessment & Plan     1. Hypertension, unspecified type  - Comprehensive metabolic panel  2. Depression, unspecified depression type [place] stable  3. Possible exposure to STD Patient new relationship and wishes to be checked. - Urine cytology ancillary only  4. Hypercholesterolemia Statin. - Lipid Panel With LDL/HDL Ratio  5. Screening for HIV (human immunodeficiency virus) - HIV Antibody (routine testing w rflx)  No follow-ups on file.      I, Wilhemena Durie, MD, have reviewed all documentation for this visit. The documentation on 11/29/20 for the exam, diagnosis, procedures, and orders are all accurate and complete.    Alexee Delsanto Cranford Mon, MD  [facility] [phone] (phone) [phone] (fax)  [place]

## 2020-11-21 ENCOUNTER — Ambulatory Visit (INDEPENDENT_AMBULATORY_CARE_PROVIDER_SITE_OTHER): Payer: PPO | Admitting: Family Medicine

## 2020-11-21 ENCOUNTER — Encounter: Payer: Self-pay | Admitting: Family Medicine

## 2020-11-21 ENCOUNTER — Other Ambulatory Visit: Payer: Self-pay

## 2020-11-21 ENCOUNTER — Ambulatory Visit: Payer: PPO | Admitting: Speech Pathology

## 2020-11-21 ENCOUNTER — Other Ambulatory Visit (HOSPITAL_COMMUNITY)
Admission: RE | Admit: 2020-11-21 | Discharge: 2020-11-21 | Disposition: A | Discharge status: Home / Self Care | Payer: PPO | Source: Ambulatory Visit | Attending: Family Medicine | Admitting: Family Medicine

## 2020-11-21 VITALS — BP 128/86 | HR 77 | Temp 98.4°F | Resp 16 | Ht 70.0 in | Wt 184.0 lb

## 2020-11-21 DIAGNOSIS — F32A Depression, unspecified: Secondary | ICD-10-CM | POA: Diagnosis not present

## 2020-11-21 DIAGNOSIS — Z202 Contact with and (suspected) exposure to infections with a predominantly sexual mode of transmission: Secondary | ICD-10-CM | POA: Insufficient documentation

## 2020-11-21 DIAGNOSIS — I10 Essential (primary) hypertension: Secondary | ICD-10-CM | POA: Diagnosis not present

## 2020-11-21 DIAGNOSIS — Z114 Encounter for screening for human immunodeficiency virus [HIV]: Secondary | ICD-10-CM | POA: Diagnosis not present

## 2020-11-21 DIAGNOSIS — E7800 Pure hypercholesterolemia, unspecified: Secondary | ICD-10-CM

## 2020-11-22 LAB — URINE CYTOLOGY ANCILLARY ONLY
Chlamydia: NEGATIVE
Comment: NEGATIVE
Comment: NEGATIVE
Comment: NORMAL
Neisseria Gonorrhea: NEGATIVE
Trichomonas: NEGATIVE

## 2020-11-22 LAB — COMPREHENSIVE METABOLIC PANEL
ALT: 48 IU/L — ABNORMAL HIGH (ref 0–44)
AST: 30 IU/L (ref 0–40)
Albumin/Globulin Ratio: 1.7 (ref 1.2–2.2)
Albumin: 4 g/dL (ref 3.8–4.9)
Alkaline Phosphatase: 72 IU/L (ref 44–121)
BUN/Creatinine Ratio: 17 (ref 10–24)
BUN: 17 mg/dL (ref 8–27)
Bilirubin Total: 0.8 mg/dL (ref 0.0–1.2)
CO2: 24 mmol/L (ref 20–29)
Calcium: 9.1 mg/dL (ref 8.6–10.2)
Chloride: 105 mmol/L (ref 96–106)
Creatinine, Ser: 1.03 mg/dL (ref 0.76–1.27)
GFR calc Af Amer: 91 mL/min/{1.73_m2} (ref >59)
GFR calc non Af Amer: 79 mL/min/{1.73_m2} (ref >59)
Globulin, Total: 2.4 g/dL (ref 1.5–4.5)
Glucose: 100 mg/dL — ABNORMAL HIGH (ref 65–99)
Potassium: 4.2 mmol/L (ref 3.5–5.2)
Sodium: 140 mmol/L (ref 134–144)
Total Protein: 6.4 g/dL (ref 6.0–8.5)

## 2020-11-22 LAB — LIPID PANEL WITH LDL/HDL RATIO
Cholesterol, Total: 139 mg/dL (ref 100–199)
HDL: 57 mg/dL (ref >39)
LDL Chol Calc (NIH): 65 mg/dL (ref 0–99)
LDL/HDL Ratio: 1.1 ratio (ref 0.0–3.6)
Triglycerides: 88 mg/dL (ref 0–149)
VLDL Cholesterol Cal: 17 mg/dL (ref 5–40)

## 2020-11-22 LAB — HIV ANTIBODY (ROUTINE TESTING W REFLEX): HIV Screen 4th Generation wRfx: NONREACTIVE

## 2020-11-26 ENCOUNTER — Telehealth: Payer: Self-pay

## 2020-11-26 ENCOUNTER — Ambulatory Visit: Payer: PPO | Admitting: Speech Pathology

## 2020-11-26 ENCOUNTER — Other Ambulatory Visit: Payer: Self-pay

## 2020-11-26 DIAGNOSIS — R4701 Aphasia: Secondary | ICD-10-CM | POA: Diagnosis not present

## 2020-11-26 DIAGNOSIS — I69920 Aphasia following unspecified cerebrovascular disease: Secondary | ICD-10-CM

## 2020-11-26 NOTE — Therapy (Signed)
[place] MAIN [facility] SERVICES [address] [place], [place], [address] Phone: [phone]   Fax:  [phone]  Speech Language Pathology Treatment  Patient Details  Name: Johnathan Campbell MRN: [id number] Date of Birth: Sep 04, 1961 Referring Provider (SLP): Dr. Manuella Ghazi   Encounter Date: 11/26/2020   End of Session - 11/26/20 1157    Visit Number 65    Number of Visits 95    Date for SLP Re-Evaluation 11/28/20    Authorization Type Medicare    Authorization Time Period 09/09/2020    Authorization - Visit Number 6    Progress Note Due on Visit 10    SLP Start Time 1000    SLP Stop Time  1055    SLP Time Calculation (min) 55 min    Activity Tolerance Patient tolerated treatment well           Past Medical History:  Diagnosis Date  . Acute intractable headache    unspecified headache type  . Allergy   . Aphasia S/P CVA   . Clot    blood clot  . Completed stroke ([place])   . Hypercholesterolemia     Past Surgical History:  Procedure Laterality Date  . HERNIA REPAIR  twice    There were no vitals filed for this visit.   Subjective Assessment - 11/26/20 1152    Subjective "I learn something every time."    Currently in Pain? No/denies                 ADULT SLP TREATMENT - 11/26/20 1152      General Information   Behavior/Cognition Alert;Cooperative;Pleasant mood    HPI Patient is a 60 year old male who suffered an ischemic left MCA infarct with involvement of both temporal and parietal lobes on 05/15/2013. Patient history is significant for aphasia and seizures. Patient received SLP services in 2017 and has returned because he says his speech is "still not where he wants it to be". Patient has been attending teletherapy sessions with the [facility] (T[facility]) due to COVID-19, however, he is searching for individual therapy because he wants more intensive treatment.       Treatment Provided   Treatment provided  Cognitive-Linquistic      Pain Assessment   Pain Assessment No/denies pain      Cognitive-Linquistic Treatment   Treatment focused on Aphasia;Patient/family/caregiver education    Skilled Treatment Patient told story about helping a friend with a flat tire, with SLP interjecting x3 with question cues for pt to clarify message. Speaking more rapidly today than usual; had to remind pt to slow rate several times. He reports continued frustration with phone conversations. Educated on apps to assist with captioning calls; pt downloaded and we role-played use of app in session today. Patient required mod-max cues initially to look at the screen vs listen. Once doing so, he was excited for the potential for this; he plans to have family members download this to reduce communication breakdowns. Targeted auditory comprehension and verbal expression using descriptive activity with constraints; patient able to provide appropriate descriptions with occasional min cues. Requested repetition/clarification from SLP ~80% of the time when he did not comprehend during this structured task, but was less aware during conversation.      Assessment / Recommendations / Plan   Plan Continue with current plan of care      Progression Toward Goals   Progression toward goals Progressing toward goals  SLP Education - 11/26/20 1157    Education Details slow rate    Person(s) Educated Patient    Methods Explanation    Comprehension Verbalized understanding            SLP Short Term Goals - 10/17/20 1122      SLP SHORT TERM GOAL #1   Title Patient will identify 3 barriers/opportunities to use multimodal communication in his daily life.    Time 3    Period Weeks    Status New    Target Date 11/07/20      SLP SHORT TERM GOAL #2   Title Patient will report use of speech-to-text outside of ST to assist with comprehension in real-life communication scenario.    Time 3    Period Weeks    Status New     Target Date 11/07/20            SLP Long Term Goals - 10/17/20 1121      SLP LONG TERM GOAL #1   Title Patient will generate grammatical and cogent sentence to complete abstract/comple linguistic task with 80% accuracy.    Time 8    Period Weeks    Status Partially Met      SLP LONG TERM GOAL #2   Title Patient will read aloud sentences and multi-syllabic words, maintaining phonemic accuracy, with 80% accuracy.      Time 8    Period Weeks    Status Partially Met      SLP LONG TERM GOAL #3   Title Patient will write grammatical and cogent sentence to complete simple/basic linguistic task with 80% accuracy.    Time 8    Period Weeks    Status Partially Met      SLP LONG TERM GOAL #4   Title Patient will complete multi-unit processing tasks with 80% accuracy without the need of repetition of task instructions or significant delays in responding, with use of compensations/assistive technology if needed.    Time 8    Period Weeks    Status Revised      SLP LONG TERM GOAL #5   Title Patient will demonstrate reading comprehension for paragraphs with 80% accuracy.    Time 8    Period Weeks    Status Partially Met            Plan - 11/26/20 1158    Clinical Impression Statement Johnathan Campbell remains motivated for improvements and is receptive to using strategies to aid his comprehension and verbal expression. He demonstrated ability to use captioning app for phone calls today with mod cues. Continue skilled ST to assist with carryover in use of strategies outside of ST.    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 Seizures ([place]) 05/21/2020  . Depression 01/16/2016  . Cephalalgia 06/03/2015  .  Problems influencing health status 06/03/2015  . 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 Angiopathy 06/03/2015  . Completed stroke ([place]) 06/03/2015  . Hypercholesterolemia 06/03/2015   Deneise Lever, [place], [place] 11/26/2020, 11:59 AM  [place]  [place], [place], [address] Phone: [phone]   Fax:  [phone]   Name: Johnathan Campbell MRN: [id number] Date of Birth: 1961-03-01

## 2020-11-26 NOTE — Telephone Encounter (Signed)
Pt advised.   Thanks,   -Elhadj Girton  

## 2020-11-26 NOTE — Telephone Encounter (Signed)
-----   Message from Jerrol Banana., MD sent at 11/22/2020  2:43 PM EST ----- Labs all within normal limits.

## 2020-11-28 ENCOUNTER — Ambulatory Visit: Payer: PPO | Admitting: Speech Pathology

## 2020-12-03 ENCOUNTER — Other Ambulatory Visit: Payer: Self-pay

## 2020-12-03 ENCOUNTER — Ambulatory Visit: Payer: PPO | Attending: Neurology | Admitting: Speech Pathology

## 2020-12-03 DIAGNOSIS — R4701 Aphasia: Secondary | ICD-10-CM | POA: Diagnosis not present

## 2020-12-03 DIAGNOSIS — R41841 Cognitive communication deficit: Secondary | ICD-10-CM | POA: Insufficient documentation

## 2020-12-03 DIAGNOSIS — I69920 Aphasia following unspecified cerebrovascular disease: Secondary | ICD-10-CM | POA: Insufficient documentation

## 2020-12-03 NOTE — Therapy (Signed)
[place] MAIN [facility] SERVICES [address] [place], [place], [address] Phone: [phone]   Fax:  [phone]  Speech Language Pathology Treatment/ Recertification  Patient Details  Name: Johnathan Campbell MRN: [id number] Date of Birth: Oct 20, 1961 No data recorded  Encounter Date: 12/03/2020   End of Session - 12/03/20 1238    Visit Number 71    Number of Visits 95    Date for SLP Re-Evaluation 03/03/21    Authorization Type Medicare    Authorization - Visit Number 7    Progress Note Due on Visit 10    SLP Start Time 1000    SLP Stop Time  1100    SLP Time Calculation (min) 60 min    Activity Tolerance Patient tolerated treatment well           Past Medical History:  Diagnosis Date  . Acute intractable headache    unspecified headache type  . Allergy   . Aphasia S/P CVA   . Clot    blood clot  . Completed stroke ([place])   . Hypercholesterolemia     Past Surgical History:  Procedure Laterality Date  . HERNIA REPAIR  twice    There were no vitals filed for this visit.   Subjective Assessment - 12/03/20 1237    Subjective Reports practicing with phone call app    Currently in Pain? No/denies                 ADULT SLP TREATMENT - 12/03/20 1237      General Information   Behavior/Cognition Alert;Cooperative;Pleasant mood    HPI Patient is a 60 year old male who suffered an ischemic left MCA infarct with involvement of both temporal and parietal lobes on 05/15/2013. Patient history is significant for aphasia and seizures. Patient received SLP services in 2017 and has returned because he says his speech is "still not where he wants it to be". Patient has been attending teletherapy sessions with the [facility] (T[facility]) due to COVID-19, however, he is searching for individual therapy because he wants more intensive treatment.       Treatment Provided   Treatment provided Cognitive-Linquistic      Pain Assessment    Pain Assessment No/denies pain      Cognitive-Linquistic Treatment   Treatment focused on Aphasia;Patient/family/caregiver education    Skilled Treatment Patient reports using app with girlfriend, family to assist with comprehension on the phone. "I need to practice more." Targeted high-level wordfinding, decoding mod complex phrases/sentences; pt completed 80% accuracy, cues for awareness of errors (usually comprehension breakdown). Pt read phrases/multisyllabic words responses out loud 75% accuracy. SLP educated that best way for pt to improve accuracy is to slow his rate; occasional cues required for this in conversation today.      Assessment / Recommendations / Plan   Plan Continue with current plan of care      Progression Toward Goals   Progression toward goals Progressing toward goals            SLP Education - 12/03/20 1249    Education Details slow rate increases accuracy    Person(s) Educated Patient    Methods Explanation    Comprehension Verbalized understanding            SLP Short Term Goals - 12/03/20 1239      SLP SHORT TERM GOAL #1   Title Patient will identify 3 barriers/opportunities to use multimodal communication in his daily life.  Time 3    Period Weeks    Status Achieved    Target Date 11/07/20      SLP SHORT TERM GOAL #2   Title Patient will report use of speech-to-text outside of ST to assist with comprehension in real-life communication scenario.    Time 3    Period Weeks    Status Achieved    Target Date 11/07/20            SLP Long Term Goals - 12/03/20 1239      SLP LONG TERM GOAL #1   Title Patient will generate grammatical and cogent sentence to complete abstract/comple linguistic task with 80% accuracy.    Time 8    Period Weeks    Status Achieved      SLP LONG TERM GOAL #2   Title Patient will read aloud sentences and multi-syllabic words, maintaining phonemic accuracy, with 80% accuracy.      Time 8    Period Weeks     Status Partially Met      SLP LONG TERM GOAL #3   Title Patient will write grammatical and cogent sentence to complete simple/basic linguistic task with 80% accuracy.    Time 8    Period Weeks    Status Partially Met      SLP LONG TERM GOAL #4   Title Patient will complete multi-unit processing tasks with 80% accuracy without the need of repetition of task instructions or significant delays in responding, with use of compensations/assistive technology if needed.    Time 8    Period Weeks    Status Revised      SLP LONG TERM GOAL #5   Title Patient will demonstrate reading comprehension for paragraphs with 80% accuracy.    Time 8    Period Weeks    Status Partially Met            Plan - 12/03/20 1250    Clinical Impression Statement Doroteo remains motivated for improvements and is receptive to using strategies to aid his comprehension and verbal expression. Reports using app outside of ST to aid comprehension on the phone, as well as another app to assist with in-person conversation. Ongoing training in use of slow rate to improve accuracy when reading aloud or with breakdowns in conversation. Have decreased frequency to 1x per week; anticipate 4-8 more sessions to maximize carryover of compensations for aphasia. Continue skilled ST to assist with carryover in use of strategies outside of ST.    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 Seizures ([place]) 05/21/2020  . Depression 01/16/2016  . Cephalalgia 06/03/2015  . Problems influencing health status 06/03/2015  . 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 Angiopathy 06/03/2015  .  Completed stroke ([place]) 06/03/2015  . Hypercholesterolemia 06/03/2015   Deneise Lever, [place], [place] 12/03/2020, 12:53 PM  [place] MAIN [facility] SERVICES [address] [place], [place], [address] Phone: [phone]   Fax:  [phone]   Name: SEQUOIA MINCEY MRN: [id number] Date of Birth: 07/14/1961

## 2020-12-05 ENCOUNTER — Ambulatory Visit: Payer: PPO | Admitting: Speech Pathology

## 2020-12-10 ENCOUNTER — Other Ambulatory Visit: Payer: Self-pay

## 2020-12-10 ENCOUNTER — Ambulatory Visit: Payer: PPO | Admitting: Speech Pathology

## 2020-12-10 DIAGNOSIS — R4701 Aphasia: Secondary | ICD-10-CM

## 2020-12-10 NOTE — Therapy (Signed)
[place] MAIN [facility] SERVICES [address] [place], [place], [address] Phone: [phone]   Fax:  [phone]  Speech Language Pathology Treatment  Patient Details  Name: Johnathan Campbell MRN: [id number] Date of Birth: [birth year] No data recorded  Encounter Date: 12/10/2020   End of Session - 12/10/20 1102    Visit Number 53    Number of Visits 95    Date for SLP Re-Evaluation 03/03/21    Authorization - Visit Number 8    Progress Note Due on Visit 10    SLP Start Time 1007   7 min late   SLP Stop Time  1100    SLP Time Calculation (min) 53 min    Activity Tolerance Patient tolerated treatment well           Past Medical History:  Diagnosis Date  . Acute intractable headache    unspecified headache type  . Allergy   . Aphasia S/P CVA   . Clot    blood clot  . Completed stroke ([place])   . Hypercholesterolemia     Past Surgical History:  Procedure Laterality Date  . HERNIA REPAIR  twice    There were no vitals filed for this visit.   Subjective Assessment - 12/10/20 1104    Subjective "Sorry I'm late."    Currently in Pain? No/denies                 ADULT SLP TREATMENT - 12/10/20 1102      General Information   Behavior/Cognition Alert;Cooperative;Pleasant mood      Treatment Provided   Treatment provided Cognitive-Linquistic      Pain Assessment   Pain Assessment No/denies pain      Cognitive-Linquistic Treatment   Treatment focused on Aphasia;Patient/family/caregiver education    Skilled Treatment Targeted written expression/sentence generation in functional task. Pt wrote sentences describing basic details of a documentary he watched on Netflix 80% accuracy. Pt corrected 4/7 errors while re-reading out loud, mod cues to make other minor corrections.Pt read paragraph of TV show summary out loud and demo'd comprehension 90% by rephrasing content and answering questions. Discussed upcoming schedule using visual  aid; plan for likely d/c after 2 more sessions.      Assessment / Recommendations / Plan   Plan Continue with current plan of care      Progression Toward Goals   Progression toward goals Progressing toward goals              SLP Short Term Goals - 12/03/20 1239      SLP SHORT TERM GOAL #1   Title Patient will identify 3 barriers/opportunities to use multimodal communication in his daily life.    Time 3    Period Weeks    Status Achieved    Target Date 11/07/20      SLP SHORT TERM GOAL #2   Title Patient will report use of speech-to-text outside of ST to assist with comprehension in real-life communication scenario.    Time 3    Period Weeks    Status Achieved    Target Date 11/07/20            SLP Long Term Goals - 12/03/20 1239      SLP LONG TERM GOAL #1   Title Patient will generate grammatical and cogent sentence to complete abstract/comple linguistic task with 80% accuracy.    Time 8    Period Weeks    Status Achieved  SLP LONG TERM GOAL #2   Title Patient will read aloud sentences and multi-syllabic words, maintaining phonemic accuracy, with 80% accuracy.      Time 8    Period Weeks    Status Partially Met      SLP LONG TERM GOAL #3   Title Patient will write grammatical and cogent sentence to complete simple/basic linguistic task with 80% accuracy.    Time 8    Period Weeks    Status Partially Met      SLP LONG TERM GOAL #4   Title Patient will complete multi-unit processing tasks with 80% accuracy without the need of repetition of task instructions or significant delays in responding, with use of compensations/assistive technology if needed.    Time 8    Period Weeks    Status Revised      SLP LONG TERM GOAL #5   Title Patient will demonstrate reading comprehension for paragraphs with 80% accuracy.    Time 8    Period Weeks    Status Partially Met            Plan - 12/10/20 1103    Clinical Impression Statement Johnathan Campbell remains motivated  for improvements and is receptive to using strategies to aid his comprehension and verbal expression. Spontaneously double-checked his word today by re-read and using speech-to-text. Have decreased frequency to 1x per week; anticipate 2-4 more sessions to maximize carryover of compensations for aphasia. Continue skilled ST to assist with carryover in use of strategies outside of ST.    Speech Therapy Frequency 2x / week    Duration 12 weeks           Patient will benefit from skilled therapeutic intervention in order to improve the following deficits and impairments:   Aphasia    Problem List Patient Active Problem List   Diagnosis Date Noted  . Seizures ([place]) 05/21/2020  . Depression 01/16/2016  . Cephalalgia 06/03/2015  . Problems influencing health status 06/03/2015  . 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 Angiopathy 06/03/2015  . Completed stroke ([place]) 06/03/2015  . Hypercholesterolemia 06/03/2015   Deneise Lever, [place], [place] Speech-Language Pathologist   [name] 12/10/2020, 11:04 AM  [place] MAIN [facility] SERVICES [address] [place], [place], [address] Phone: [phone]   Fax:  [phone]   Name: Johnathan Campbell MRN: [id number] Date of Birth: Oct 19, 1961

## 2020-12-12 ENCOUNTER — Ambulatory Visit: Payer: PPO | Admitting: Speech Pathology

## 2020-12-17 ENCOUNTER — Ambulatory Visit: Payer: PPO | Admitting: Speech Pathology

## 2020-12-19 ENCOUNTER — Ambulatory Visit: Payer: PPO | Admitting: Speech Pathology

## 2020-12-19 ENCOUNTER — Other Ambulatory Visit: Payer: Self-pay

## 2020-12-19 DIAGNOSIS — R4701 Aphasia: Secondary | ICD-10-CM

## 2020-12-19 NOTE — Therapy (Signed)
[place] MAIN [facility] SERVICES [address] [place], [place], [address] Phone: [phone]   Fax:  [phone]  Speech Language Pathology Treatment  Patient Details  Name: Johnathan Campbell MRN: [id number] Date of Birth: 05-11-1961 No data recorded  Encounter Date: 12/19/2020   End of Session - 12/19/20 1127    Visit Number 62    Number of Visits 95    Date for SLP Re-Evaluation 03/03/21    Authorization Type Medicare    Authorization Time Period 09/09/2020    Authorization - Visit Number 9    Progress Note Due on Visit 10    SLP Start Time 1007    SLP Stop Time  1100    SLP Time Calculation (min) 53 min    Activity Tolerance Patient tolerated treatment well           Past Medical History:  Diagnosis Date  . Acute intractable headache    unspecified headache type  . Allergy   . Aphasia S/P CVA   . Clot    blood clot  . Completed stroke ([place])   . Hypercholesterolemia     Past Surgical History:  Procedure Laterality Date  . HERNIA REPAIR  twice    There were no vitals filed for this visit.   Subjective Assessment - 12/19/20 1120    Subjective Completed HW    Currently in Pain? No/denies                 ADULT SLP TREATMENT - 12/19/20 1120      General Information   Behavior/Cognition Alert;Cooperative;Pleasant mood      Treatment Provided   Treatment provided Cognitive-Linquistic      Cognitive-Linquistic Treatment   Treatment focused on Aphasia;Patient/family/caregiver education    Skilled Treatment Patient read aloud paragraph summary he wrote for homework about a show he watched on Netflix. Required min cues to slow rate, and to use description to "reset" when he was stuck on a word. Patient described communication frustrations with difficult conversations; SLP suggested pt make a list of his talking points beforehand and pt agreed it was helpful to do this. Targeted processing of multi-unit instructions, first  using no assistive technology and then repeating command with pt using speech-to-text to aid processing/comprehension. Without speech-to-text, accuracy for each command ranged from 30% to 60%, improved to average of 80% with compensations. Discussed opportunities to re-engage with aphasia support community as pt plans to move toward [facility] in the coming months.      Assessment / Recommendations / Plan   Plan Continue with current plan of care      Progression Toward Goals   Progression toward goals Progressing toward goals            SLP Education - 12/19/20 1126    Education Details rephrase what he understands to help his communication partners be aware of any breakdowns in his comprehension    Person(s) Educated Patient    Methods Explanation;Demonstration    Comprehension Verbalized understanding            SLP Short Term Goals - 12/03/20 1239      SLP SHORT TERM GOAL #1   Title Patient will identify 3 barriers/opportunities to use multimodal communication in his daily life.    Time 3    Period Weeks    Status Achieved    Target Date 11/07/20      SLP SHORT TERM GOAL #2   Title Patient will  report use of speech-to-text outside of ST to assist with comprehension in real-life communication scenario.    Time 3    Period Weeks    Status Achieved    Target Date 11/07/20            SLP Long Term Goals - 12/03/20 1239      SLP LONG TERM GOAL #1   Title Patient will generate grammatical and cogent sentence to complete abstract/comple linguistic task with 80% accuracy.    Time 8    Period Weeks    Status Achieved      SLP LONG TERM GOAL #2   Title Patient will read aloud sentences and multi-syllabic words, maintaining phonemic accuracy, with 80% accuracy.      Time 8    Period Weeks    Status Partially Met      SLP LONG TERM GOAL #3   Title Patient will write grammatical and cogent sentence to complete simple/basic linguistic task with 80% accuracy.    Time 8     Period Weeks    Status Partially Met      SLP LONG TERM GOAL #4   Title Patient will complete multi-unit processing tasks with 80% accuracy without the need of repetition of task instructions or significant delays in responding, with use of compensations/assistive technology if needed.    Time 8    Period Weeks    Status Revised      SLP LONG TERM GOAL #5   Title Patient will demonstrate reading comprehension for paragraphs with 80% accuracy.    Time 8    Period Weeks    Status Partially Met            Plan - 12/19/20 1127    Clinical Impression Statement Ivin reports carryover of trained strategies and compensations outside of therapy. He demonstrated improved comprehension/processing of multi-unit instructions using assistive technology today. Patient in agreement with d/c next visit. Continue skilled ST to maximize functional communication and assist with carryover in use of strategies outside of ST.    Speech Therapy Frequency 2x / week    Duration 12 weeks           Patient will benefit from skilled therapeutic intervention in order to improve the following deficits and impairments:   Aphasia    Problem List Patient Active Problem List   Diagnosis Date Noted  . Seizures ([place]) 05/21/2020  . Depression 01/16/2016  . Cephalalgia 06/03/2015  . Problems influencing health status 06/03/2015  . 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 Angiopathy 06/03/2015  . Completed stroke ([place]) 06/03/2015  . Hypercholesterolemia 06/03/2015   Deneise Lever, [place], CCC-SLP Speech-Language Pathologist   [name] 12/19/2020, 11:29 AM  [place] MAIN [facility] SERVICES [address] [place], [place], [address] Phone: [phone]   Fax:  [phone]   Name: Johnathan Campbell MRN: [id number] Date of Birth: [birth year]

## 2020-12-24 ENCOUNTER — Ambulatory Visit: Payer: PPO | Admitting: Speech Pathology

## 2020-12-24 ENCOUNTER — Other Ambulatory Visit: Payer: Self-pay

## 2020-12-24 DIAGNOSIS — R4701 Aphasia: Secondary | ICD-10-CM

## 2020-12-24 NOTE — Therapy (Signed)
[place] MAIN [facility] SERVICES [address] [place], [place], [address] Phone: [phone]   Fax:  [phone]  Speech Language Pathology Treatment and Discharge Summary  Patient Details  Name: Johnathan Campbell MRN: [id number] Date of Birth: 07-Jan-1961 No data recorded  Encounter Date: 12/24/2020   End of Session - 12/24/20 1236    Visit Number 22    Number of Visits 95    Date for SLP Re-Evaluation 03/03/21    Authorization Type Medicare    Authorization - Visit Number 10    Progress Note Due on Visit 10    SLP Start Time 1000    SLP Stop Time  1100    SLP Time Calculation (min) 60 min    Activity Tolerance Patient tolerated treatment well           Past Medical History:  Diagnosis Date  . Acute intractable headache    unspecified headache type  . Allergy   . Aphasia S/P CVA   . Clot    blood clot  . Completed stroke ([place])   . Hypercholesterolemia     Past Surgical History:  Procedure Laterality Date  . HERNIA REPAIR  twice    There were no vitals filed for this visit.   Subjective Assessment - 12/24/20 1228    Subjective Patient ready for d/c today.    Currently in Pain? 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 Patient history is significant for aphasia and seizures. Patient received SLP services in 2017 and has returned because he says his speech is "still not where he wants it to be". Patient has been attending teletherapy sessions with the [facility] ([facility] due to COVID-19, however, he is searching for individual therapy because he wants more intensive treatment.       Treatment Provided   Treatment provided Cognitive-Linquistic      Pain Assessment    Pain Assessment No/denies pain      Cognitive-Linquistic Treatment   Treatment focused on Aphasia;Patient/family/caregiver education    Skilled Treatment Patient reported confidence in being able to use compensations to help him with communication in his day-to-day life. Reassessed goals today: Pt read multisyllabic words and sentences 100% accuracy (with 2 self-corrections). Followed multi-component instructions 86% accuracy using speech-to-text on his phone. Read short paragraphs and responded to comprehension questions 90% accuracy. Generates simple written sentence 100% accuracy, 60% accuracy for mod complex (improves with use of strategies for error awareness). Readministered Western Aphasia Battery-Revised with improvement of aphasia quotient to 77.9 (mild) from 71.8 (moderate) at initial evaluation. See below for details. Education completed.      Assessment / Recommendations / Plan   Plan Discharge SLP treatment due to (comment);All goals met      Progression Toward Goals   Progression toward goals Goals met, education completed, patient discharged from SLP       Today: 12/24/2020  Western Aphasia Battery - Revised Spontaneous Speech Information Content: 8/10 Fluency: 9/10 Auditory Verbal Comprehension Yes/No Questions: 57/60 Auditory Word Recognition: 53/60 Sequential Commands: 57/80 Repetition Repetition: 46/100 Naming Object Naming: 60/60 Word Fluency: 16/20 Sentence Completion: 7/10 Responsive Speech: 7/10 Aphasia Quotient: 77.9 (mild) Reading and  Writing Reading (Task A + B): n/a Writing (not completed): n/a  11/27/2019  Western Aphasia Battery - Revised Spontaneous Speech Information Content: 9/10 Fluency: 9/10 Auditory Verbal Comprehension Yes/No Questions: 54/60 Auditory Word Recognition: 52/60 Sequential Commands: 18/80 Repetition Repetition: 32/100 Naming Object Naming: 58/60 Word Fluency: 10/20 Sentence Completion: 10/10 Responsive Speech: 7/10 Aphasia  Quotient: 71.8 (moderate) Reading and Writing Reading (Task A + B): 52/60 Writing (not completed): 21/50      SLP Education - 12/24/20 1236    Education Details Keep practicing use of compensations in daily life    Person(s) Educated Patient    Methods Explanation    Comprehension Verbalized understanding            SLP Short Term Goals - 12/24/20 1245      SLP SHORT TERM GOAL #1   Title Patient will identify 3 barriers/opportunities to use multimodal communication in his daily life.    Time 3    Period Weeks    Status Achieved    Target Date 11/07/20      SLP SHORT TERM GOAL #2   Title Patient will report use of speech-to-text outside of ST to assist with comprehension in real-life communication scenario.    Time 3    Period Weeks    Status Achieved    Target Date 11/07/20            SLP Long Term Goals - 12/24/20 1244      SLP LONG TERM GOAL #1   Title Patient will generate grammatical and cogent sentence to complete abstract/comple linguistic task with 80% accuracy.    Time 8    Period Weeks    Status Achieved      SLP LONG TERM GOAL #2   Title Patient will read aloud sentences and multi-syllabic words, maintaining phonemic accuracy, with 80% accuracy.      Time 8    Period Weeks    Status Achieved      SLP LONG TERM GOAL #3   Title Patient will write grammatical and cogent sentence to complete simple/basic linguistic task with 80% accuracy.    Time 8    Period Weeks    Status Partially Met      SLP LONG TERM GOAL #4   Title Patient will complete multi-unit processing tasks with 80% accuracy without the need of repetition of task instructions or significant delays in responding, with use of compensations/assistive technology if needed.    Time 8    Period Weeks    Status Achieved      SLP LONG TERM GOAL #5   Title Patient will demonstrate reading comprehension for paragraphs with 80% accuracy.    Time 8    Period Weeks    Status Achieved             Plan - 12/24/20 1237    Clinical Impression Statement Johnathan Campbell is able to identify communication barriers and demonstrates confidence in choosing/using a strategy or assistive technology to improve his communication. Since beginning therapy, he has improved his awareness of breakdowns in comprehension significantly; he typically requests repetition when needed most of the time. With effective use of compensations and focus on using slower rate, pt has been successful in reducing the impact of his aphasia on his ability to communicate. Overall severity of aphasia, per Western Aphasia Battery-Revised, which was readministered today, has improved from moderate to mild. Patient is pleased with current functional level, and having met 4/5 LTGs, is in agreement with   d/c today.    Speech Therapy Frequency --   d/c   Duration --   d/c          Patient will benefit from skilled therapeutic intervention in order to improve the following deficits and impairments:   Aphasia    Problem List Patient Active Problem List   Diagnosis Date Noted  . Seizures ([place]) 05/21/2020  . Depression 01/16/2016  . Cephalalgia 06/03/2015  . Problems influencing health status 06/03/2015  . 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 Angiopathy 06/03/2015  . Completed stroke ([place]) 06/03/2015  . Hypercholesterolemia 06/03/2015   SPEECH THERAPY DISCHARGE SUMMARY  Visits from Start of Care:90  Current functional level related to goals / functional outcomes: Patient fully met 4/5 LTGs and partially met 1/5 LTGs. He is using compensations effectively in conversation in [place] room and reports carryover of strategies outside of ST in interactions with family, friends, and the community.   Remaining deficits: Overall mild aphasia with receptive>expressive deficits. Breakdowns in comprehension occur particularly with more complex content or multi-step instructions, however patient is able to use  assistive technology (speech-to-text) to improve his comprehension, as well as request repetition from the speaker when needed.    Education / Equipment: Compensations for aphasia Plan: Patient agrees to discharge.  Patient goals were met. Patient is being discharged due to being pleased with the current functional level.  ?????         Deneise Lever, [place], CCC-SLP Speech-Language Pathologist  [name] 12/24/2020, 12:47 PM  [place] MAIN [facility] SERVICES [address] [place], [place], [address] Phone: [phone]   Fax:  [phone]   Name: Johnathan Campbell MRN: [id number] Date of Birth: 03-Jan-1961

## 2020-12-26 ENCOUNTER — Ambulatory Visit: Payer: PPO | Admitting: Speech Pathology

## 2020-12-31 ENCOUNTER — Encounter: Payer: PPO | Admitting: Speech Pathology

## 2021-01-07 ENCOUNTER — Encounter: Payer: PPO | Admitting: Speech Pathology

## 2021-01-14 ENCOUNTER — Encounter: Payer: PPO | Admitting: Speech Pathology

## 2021-01-15 ENCOUNTER — Other Ambulatory Visit: Payer: Self-pay | Admitting: Family Medicine

## 2021-01-15 NOTE — Telephone Encounter (Signed)
Requested Prescriptions  Pending Prescriptions Disp Refills  . venlafaxine (EFFEXOR) 75 MG tablet [Pharmacy Med Name: VENLAFAXINE HCL 75 MG TABLET] 180 tablet 0    Sig: TAKE 1 TABLET BY MOUTH TWICE A DAY     Psychiatry: Antidepressants - SNRI - desvenlafaxine & venlafaxine Failed - 01/15/2021  1:27 AM      Failed - LDL in normal range and within 360 days    LDL Cholesterol (Calc)  Date Value Ref Range Status  08/11/2017 63 mg/dL (calc) Final    Comment:    Reference range: <100 . Desirable range <100 mg/dL for primary prevention;   <70 mg/dL for patients with CHD or diabetic patients  with > or = 2 CHD risk factors. [place] Kitchen LDL-C is now calculated using the Martin-Hopkins  calculation, which is a validated novel method providing  better accuracy than the Friedewald equation in the  estimation of LDL-C.  Cresenciano Genre et al. Annamaria Helling. 5852;778(24): 2061-2068  (http://education.[employer].com/faq/FAQ164)    LDL Chol Calc (NIH)  Date Value Ref Range Status  11/21/2020 65 0 - 99 mg/dL Final         Passed - Total Cholesterol in normal range and within 360 days    Cholesterol, Total  Date Value Ref Range Status  11/21/2020 139 100 - 199 mg/dL Final         Passed - Triglycerides in normal range and within 360 days    Triglycerides  Date Value Ref Range Status  11/21/2020 88 0 - 149 mg/dL Final         Passed - Completed PHQ-2 or PHQ-9 in the last 360 days      Passed - Last BP in normal range    BP Readings from Last 1 Encounters:  11/21/20 128/86         Passed - Valid encounter within last 6 months    Recent Outpatient Visits          1 month ago Hypertension, unspecified type   [facility] Jerrol Banana., MD   7 months ago Hypertension, unspecified type   [facility] Jerrol Banana., MD   10 months ago Annual physical exam   [facility] Jerrol Banana., MD   1 year ago Completed stroke [facility])    [facility] Jerrol Banana., MD   1 year ago Hypercholesterolemia   [facility] Jerrol Banana., MD      Future Appointments            In 4 months Jerrol Banana., MD [facility]           . atorvastatin (LIPITOR) 80 MG tablet [Pharmacy Med Name: ATORVASTATIN 80 MG TABLET] 90 tablet 3    Sig: TAKE 1 TABLET BY MOUTH EVERYDAY AT BEDTIME     Cardiovascular:  Antilipid - Statins Failed - 01/15/2021  1:27 AM      Failed - LDL in normal range and within 360 days    LDL Cholesterol (Calc)  Date Value Ref Range Status  08/11/2017 63 mg/dL (calc) Final    Comment:    Reference range: <100 . Desirable range <100 mg/dL for primary prevention;   <70 mg/dL for patients with CHD or diabetic patients  with > or = 2 CHD risk factors. [place] Kitchen LDL-C is now calculated using the Martin-Hopkins  calculation, which is a validated novel method providing  better accuracy than the Friedewald equation  in the  estimation of LDL-C.  Cresenciano Genre et al. Annamaria Helling. 0254;270(62): 2061-2068  (http://education.[employer].com/faq/FAQ164)    LDL Chol Calc (NIH)  Date Value Ref Range Status  11/21/2020 65 0 - 99 mg/dL Final         Passed - Total Cholesterol in normal range and within 360 days    Cholesterol, Total  Date Value Ref Range Status  11/21/2020 139 100 - 199 mg/dL Final         Passed - HDL in normal range and within 360 days    HDL  Date Value Ref Range Status  11/21/2020 57 >39 mg/dL Final         Passed - Triglycerides in normal range and within 360 days    Triglycerides  Date Value Ref Range Status  11/21/2020 88 0 - 149 mg/dL Final         Passed - Patient is not pregnant      Passed - Valid encounter within last 12 months    Recent Outpatient Visits          1 month ago Hypertension, unspecified type   [facility] Jerrol Banana., MD   7 months ago Hypertension, unspecified type    [facility] Jerrol Banana., MD   10 months ago Annual physical exam   [facility] Jerrol Banana., MD   1 year ago Completed stroke [facility])   [facility] Jerrol Banana., MD   1 year ago Hypercholesterolemia   [facility] Jerrol Banana., MD      Future Appointments            In 4 months Jerrol Banana., MD [facility] [place]

## 2021-01-21 ENCOUNTER — Encounter: Payer: PPO | Admitting: Speech Pathology

## 2021-01-28 ENCOUNTER — Encounter: Payer: PPO | Admitting: Speech Pathology

## 2021-02-27 DIAGNOSIS — I69398 Other sequelae of cerebral infarction: Secondary | ICD-10-CM | POA: Diagnosis not present

## 2021-02-27 DIAGNOSIS — G40909 Epilepsy, unspecified, not intractable, without status epilepticus: Secondary | ICD-10-CM | POA: Diagnosis not present

## 2021-02-27 DIAGNOSIS — I69320 Aphasia following cerebral infarction: Secondary | ICD-10-CM | POA: Diagnosis not present

## 2021-03-24 ENCOUNTER — Other Ambulatory Visit: Payer: Self-pay | Admitting: Family Medicine

## 2021-03-24 DIAGNOSIS — E7800 Pure hypercholesterolemia, unspecified: Secondary | ICD-10-CM

## 2021-03-24 DIAGNOSIS — I639 Cerebral infarction, unspecified: Secondary | ICD-10-CM

## 2021-03-24 DIAGNOSIS — I10 Essential (primary) hypertension: Secondary | ICD-10-CM

## 2021-03-24 NOTE — Telephone Encounter (Signed)
Copied from [place] [id number]. Topic: Quick Communication - Rx Refill/Question >> Mar 24, 2021  4:59 PM [name] A wrote: Medication: losartan (COZAAR) 50 MG tablet   Has the patient contacted their pharmacy? No. Patient has not contacted their pharmacy   Preferred Pharmacy (with phone number or street name): [id number] - [place], [place] [address]  Phone:  [phone] Fax:  [phone]  Agent: Please be advised that RX refills may take up to 3 business days. We ask that you follow-up with your pharmacy.

## 2021-03-24 NOTE — Telephone Encounter (Signed)
Requested Prescriptions  Pending Prescriptions Disp Refills  . losartan (COZAAR) 50 MG tablet [Pharmacy Med Name: LOSARTAN POTASSIUM 50 MG TAB] 90 tablet 0    Sig: TAKE 1 TABLET BY MOUTH EVERY DAY     Cardiovascular:  Angiotensin Receptor Blockers Passed - 03/24/2021  4:48 PM      Passed - Cr in normal range and within 180 days    Creat  Date Value Ref Range Status  08/11/2017 0.88 0.70 - 1.33 mg/dL Final    Comment:    For patients >61 years of age, the reference limit for Creatinine is approximately 13% higher for people identified as African-American. .    Creatinine, Ser  Date Value Ref Range Status  11/21/2020 1.03 0.76 - 1.27 mg/dL Final         Passed - K in normal range and within 180 days    Potassium  Date Value Ref Range Status  11/21/2020 4.2 3.5 - 5.2 mmol/L Final         Passed - Patient is not pregnant      Passed - Last BP in normal range    BP Readings from Last 1 Encounters:  11/21/20 128/86         Passed - Valid encounter within last 6 months    Recent Outpatient Visits          4 months ago Hypertension, unspecified type   [facility] Jerrol Banana., MD   10 months ago Hypertension, unspecified type   [facility] Jerrol Banana., MD   1 year ago Annual physical exam   [facility] Jerrol Banana., MD   1 year ago Completed stroke [facility])   [facility] Jerrol Banana., MD   2 years ago Hypercholesterolemia   [facility] Jerrol Banana., MD      Future Appointments            In 2 months Jerrol Banana., MD [facility], [place]

## 2021-03-26 ENCOUNTER — Ambulatory Visit: Payer: Self-pay | Admitting: *Deleted

## 2021-03-26 NOTE — Telephone Encounter (Signed)
  Patient wife, Vinnie Level, not on patient profile. Patient's wife reports they are newly married and she is requesting PCP be notified of medication taper. Patient's wife reports patient recently was tapered off of effexor 75mg  2 times a day from neurologist from Feb. until April. Patient was completely off of medication and doing well. Patient's wife reports she has noticed patient was refilling pill boxes with effexor again 2 times a day since she believes beginning of May. Patient now having symptoms of fatigue, nausea, tingling sensation in feet and bad dreams. Patient's wife requesting information from PCP how to taper medication again for safety to patient. Patient's wife requesting to notify her due to not to worry patient's mother. Instructed patient's wife that patient needs to have her placed on his list of contacts to share information with. Patient's wife requesting PCP call her at [phone] or patient with recommendations how to taper medication. No appt available until 04/17/21. Please advise. Care advise given. Patient verbalized understanding of care advise and to call back or go to [facility] or ED if symptoms worsen.   Reason for Disposition . [1] Caller has NON-URGENT medicine question about med that PCP prescribed AND [2] triager unable to answer question  Answer Assessment - Initial Assessment Questions 1. NAME of MEDICATION: "What medicine are you calling about?"     effexor 75 mg  2. QUESTION: "What is your question?" (e.g., double dose of medicine, side effect)     How should patient taper off of medication? 3. PRESCRIBING HCP: "Who prescribed it?" Reason: if prescribed by specialist, call should be referred to that group.     PCP 4. SYMPTOMS: "Do you have any symptoms?"     Fatigue, nausea, tingling sensation in feet, bad dreams 5. SEVERITY: If symptoms are present, ask "Are they mild, moderate or severe?"     Na  6. PREGNANCY:  "Is there any chance that you are pregnant?" "When was  your last menstrual period?"     na  Protocols used: MEDICATION QUESTION CALL-A-AH

## 2021-03-27 MED ORDER — VENLAFAXINE HCL 75 MG PO TABS: ORAL_TABLET | ORAL | 0 refills | Status: DC

## 2021-03-27 NOTE — Telephone Encounter (Signed)
Decrease to 75 mg daily for 2 weeks than 37.5 mg daily for 2 weeks, then stop

## 2021-03-27 NOTE — Telephone Encounter (Signed)
Ok to send in taper as written previously for 30 day supply

## 2021-03-27 NOTE — Addendum Note (Signed)
Addended by: Minette Headland on: 03/27/2021 09:37 AM   Modules accepted: Orders

## 2021-03-27 NOTE — Telephone Encounter (Signed)
Please advise 

## 2021-03-27 NOTE — Telephone Encounter (Signed)
Spoke with patient and his spouse of speaker phone and advised him to have DPR signed in order for [place] to talk further with his wife. Patient advised of taper he states that he only has a few pills left and would like a 30day prescription sent into the pharmacy below. Please advise. [address] [facility] pharmacy.

## 2021-04-11 ENCOUNTER — Other Ambulatory Visit: Payer: Self-pay | Admitting: Family Medicine

## 2021-04-11 NOTE — Telephone Encounter (Signed)
   Notes to clinic: review for refill Looks like patient was doing different directions    Requested Prescriptions  Pending Prescriptions Disp Refills   venlafaxine (EFFEXOR) 75 MG tablet [Pharmacy Med Name: VENLAFAXINE HCL 75 MG TABLET] 180 tablet     Sig: TAKE 1 TABLET BY MOUTH TWICE A DAY      Psychiatry: Antidepressants - SNRI - desvenlafaxine & venlafaxine Passed - 04/11/2021  1:17 PM      Passed - LDL in normal range and within 360 days    LDL Cholesterol (Calc)  Date Value Ref Range Status  08/11/2017 63 mg/dL (calc) Final    Comment:    Reference range: <100 . Desirable range <100 mg/dL for primary prevention;   <70 mg/dL for patients with CHD or diabetic patients  with > or = 2 CHD risk factors. Marland Kitchen LDL-C is now calculated using the Martin-Hopkins  calculation, which is a validated novel method providing  better accuracy than the Friedewald equation in the  estimation of LDL-C.  Cresenciano Genre et al. Annamaria Helling. 1914;782(95): 2061-2068  (http://education.[facility].com/faq/FAQ164)    LDL Chol Calc (NIH)  Date Value Ref Range Status  11/21/2020 65 0 - 99 mg/dL Final          Passed - Total Cholesterol in normal range and within 360 days    Cholesterol, Total  Date Value Ref Range Status  11/21/2020 139 100 - 199 mg/dL Final          Passed - Triglycerides in normal range and within 360 days    Triglycerides  Date Value Ref Range Status  11/21/2020 88 0 - 149 mg/dL Final          Passed - Completed PHQ-2 or PHQ-9 in the last 360 days      Passed - Last BP in normal range    BP Readings from Last 1 Encounters:  11/21/20 128/86          Passed - Valid encounter within last 6 months    Recent Outpatient Visits           4 months ago Hypertension, unspecified type   [facility] Jerrol Banana., MD   10 months ago Hypertension, unspecified type   [facility] Jerrol Banana., MD   1 year ago Annual physical exam    [facility] Jerrol Banana., MD   1 year ago Completed stroke [facility])   [facility] Jerrol Banana., MD   2 years ago Hypercholesterolemia   [facility] Jerrol Banana., MD       Future Appointments             In 1 month Jerrol Banana., MD [facility], [place]

## 2021-05-28 ENCOUNTER — Ambulatory Visit: Payer: Self-pay | Admitting: Family Medicine

## 2021-06-21 ENCOUNTER — Other Ambulatory Visit: Payer: Self-pay | Admitting: Family Medicine

## 2021-06-21 DIAGNOSIS — I10 Essential (primary) hypertension: Secondary | ICD-10-CM

## 2021-06-21 DIAGNOSIS — E7800 Pure hypercholesterolemia, unspecified: Secondary | ICD-10-CM

## 2021-06-21 DIAGNOSIS — I639 Cerebral infarction, unspecified: Secondary | ICD-10-CM

## 2021-07-18 ENCOUNTER — Other Ambulatory Visit: Payer: Self-pay | Admitting: Family Medicine

## 2021-07-18 DIAGNOSIS — I10 Essential (primary) hypertension: Secondary | ICD-10-CM

## 2021-07-18 DIAGNOSIS — E7800 Pure hypercholesterolemia, unspecified: Secondary | ICD-10-CM

## 2021-07-18 DIAGNOSIS — I639 Cerebral infarction, unspecified: Secondary | ICD-10-CM

## 2021-07-18 NOTE — Telephone Encounter (Signed)
LOV: 11/21/20 (Dr. Rosanna Randy) Next OV: not scheduled  Last refill: 06/21/21. Curtesy refill.   Please review. Thanks!

## 2021-12-28 ENCOUNTER — Other Ambulatory Visit: Payer: Self-pay | Admitting: Family Medicine

## 2021-12-29 NOTE — Telephone Encounter (Signed)
Called patient to schedule appt. Patient reports he has changed to a different provider and practice at [facility] due to moving to [place]. All medications have been changed . Requested Prescriptions  Refused Prescriptions Disp Refills   atorvastatin (LIPITOR) 80 MG tablet [Pharmacy Med Name: ATORVASTATIN 80 MG TABLET] 90 tablet 2    Sig: TAKE 1 TABLET BY MOUTH EVERYDAY AT BEDTIME     Cardiovascular:  Antilipid - Statins Failed - 12/28/2021 12:10 AM      Failed - Valid encounter within last 12 months    Recent Outpatient Visits          1 year ago Hypertension, unspecified type   [facility] Jerrol Banana., MD   1 year ago Hypertension, unspecified type   [facility] Jerrol Banana., MD   1 year ago Annual physical exam   [facility] Jerrol Banana., MD   2 years ago Completed stroke [facility])   [facility] Jerrol Banana., MD   2 years ago Hypercholesterolemia   [facility] Jerrol Banana., MD             Failed - Lipid Panel in normal range within the last 12 months    Cholesterol, Total  Date Value Ref Range Status  11/21/2020 139 100 - 199 mg/dL Final   LDL Cholesterol (Calc)  Date Value Ref Range Status  08/11/2017 63 mg/dL (calc) Final    Comment:    Reference range: <100 . Desirable range <100 mg/dL for primary prevention;   <70 mg/dL for patients with CHD or diabetic patients  with > or = 2 CHD risk factors. Marland Kitchen LDL-C is now calculated using the Martin-Hopkins  calculation, which is a validated novel method providing  better accuracy than the Friedewald equation in the  estimation of LDL-C.  Cresenciano Genre et al. Annamaria Helling. 8882;800(34): 2061-2068  (http://education.[facility].com/faq/FAQ164)    LDL Chol Calc (NIH)  Date Value Ref Range Status  11/21/2020 65 0 - 99 mg/dL Final   HDL  Date Value Ref Range Status  11/21/2020 57 >39 mg/dL Final    Triglycerides  Date Value Ref Range Status  11/21/2020 88 0 - 149 mg/dL Final         Passed - Patient is not pregnant

## 2022-02-26 ENCOUNTER — Other Ambulatory Visit: Payer: Self-pay | Admitting: Family Medicine
